# Patient Record
Sex: Male | Born: 1955 | Race: White | Hispanic: No | Smoking: Current every day smoker
Health system: Southern US, Community
[De-identification: ages and names within clinical notes are randomized; demographics above are authoritative.]

## PROBLEM LIST (undated history)

## (undated) DIAGNOSIS — C029 Malignant neoplasm of tongue, unspecified: Secondary | ICD-10-CM

## (undated) DIAGNOSIS — E1142 Type 2 diabetes mellitus with diabetic polyneuropathy: Secondary | ICD-10-CM

## (undated) DIAGNOSIS — R634 Abnormal weight loss: Secondary | ICD-10-CM

## (undated) DIAGNOSIS — J431 Panlobular emphysema: Secondary | ICD-10-CM

## (undated) DIAGNOSIS — E119 Type 2 diabetes mellitus without complications: Secondary | ICD-10-CM

## (undated) DIAGNOSIS — N200 Calculus of kidney: Secondary | ICD-10-CM

## (undated) DIAGNOSIS — E785 Hyperlipidemia, unspecified: Secondary | ICD-10-CM

## (undated) DIAGNOSIS — C021 Malignant neoplasm of border of tongue: Secondary | ICD-10-CM

## (undated) DIAGNOSIS — Z87442 Personal history of urinary calculi: Secondary | ICD-10-CM

## (undated) DIAGNOSIS — I10 Essential (primary) hypertension: Secondary | ICD-10-CM

## (undated) DIAGNOSIS — B351 Tinea unguium: Secondary | ICD-10-CM

## (undated) DIAGNOSIS — C801 Malignant (primary) neoplasm, unspecified: Secondary | ICD-10-CM

## (undated) HISTORY — DX: Essential (primary) hypertension: I10

## (undated) HISTORY — PX: PORTA CATH INSERTION: CATH118285

## (undated) HISTORY — DX: Hyperlipidemia, unspecified: E78.5

## (undated) HISTORY — PX: CYST REMOVAL NECK: SHX6281

## (undated) HISTORY — PX: OTHER SURGICAL HISTORY: SHX169

## (undated) HISTORY — PX: COLONOSCOPY: SHX174

## (undated) HISTORY — DX: Calculus of kidney: N20.0

## (undated) HISTORY — DX: Type 2 diabetes mellitus without complications: E11.9

## (undated) MED FILL — Fosaprepitant Dimeglumine For IV Infusion 150 MG (Base Eq): INTRAVENOUS | Qty: 5 | Status: AC

---

## 2005-05-24 ENCOUNTER — Ambulatory Visit: Payer: Self-pay | Admitting: Family Medicine

## 2008-10-04 ENCOUNTER — Ambulatory Visit: Payer: Self-pay | Admitting: Unknown Physician Specialty

## 2011-01-20 ENCOUNTER — Emergency Department: Payer: Self-pay | Admitting: Unknown Physician Specialty

## 2014-07-22 DIAGNOSIS — N200 Calculus of kidney: Secondary | ICD-10-CM | POA: Insufficient documentation

## 2015-05-15 DIAGNOSIS — F17219 Nicotine dependence, cigarettes, with unspecified nicotine-induced disorders: Secondary | ICD-10-CM | POA: Insufficient documentation

## 2018-05-05 DIAGNOSIS — E1165 Type 2 diabetes mellitus with hyperglycemia: Secondary | ICD-10-CM | POA: Insufficient documentation

## 2018-05-05 DIAGNOSIS — E1159 Type 2 diabetes mellitus with other circulatory complications: Secondary | ICD-10-CM | POA: Insufficient documentation

## 2018-05-05 DIAGNOSIS — I152 Hypertension secondary to endocrine disorders: Secondary | ICD-10-CM | POA: Insufficient documentation

## 2018-05-05 DIAGNOSIS — E1169 Type 2 diabetes mellitus with other specified complication: Secondary | ICD-10-CM | POA: Insufficient documentation

## 2019-05-14 DIAGNOSIS — B351 Tinea unguium: Secondary | ICD-10-CM | POA: Insufficient documentation

## 2020-11-06 ENCOUNTER — Other Ambulatory Visit: Payer: Self-pay | Admitting: Medical

## 2020-11-07 ENCOUNTER — Other Ambulatory Visit: Payer: Self-pay | Admitting: Family Medicine

## 2020-11-10 ENCOUNTER — Other Ambulatory Visit: Payer: Self-pay | Admitting: Family Medicine

## 2020-11-10 DIAGNOSIS — F17210 Nicotine dependence, cigarettes, uncomplicated: Secondary | ICD-10-CM

## 2020-11-10 DIAGNOSIS — Z122 Encounter for screening for malignant neoplasm of respiratory organs: Secondary | ICD-10-CM

## 2020-11-25 ENCOUNTER — Telehealth: Payer: Self-pay

## 2020-11-25 ENCOUNTER — Other Ambulatory Visit: Payer: Self-pay

## 2020-11-25 DIAGNOSIS — Z86010 Personal history of colonic polyps: Secondary | ICD-10-CM

## 2020-11-25 MED ORDER — NA SULFATE-K SULFATE-MG SULF 17.5-3.13-1.6 GM/177ML PO SOLN: 354.0000 mL | Freq: Once | ORAL | 0 refills | Status: AC

## 2020-11-25 NOTE — Telephone Encounter (Signed)
Gastroenterology Pre-Procedure Review  Request Date: 12/08/2020 Requesting Physician: Dr. Allegra Lai  PATIENT REVIEW QUESTIONS: The patient responded to the following health history questions as indicated:    1. Are you having any GI issues? no 2. Do you have a personal history of Polyps? Yes last one removed 1  3. Do you have a family history of Colon Cancer or Polyps? no 4. Diabetes Mellitus? Yes  5. Joint replacements in the past 12 months?no 6. Major health problems in the past 3 months?no 7. Any artificial heart valves, MVP, or defibrillator?no    MEDICATIONS & ALLERGIES:    Patient reports the following regarding taking any anticoagulation/antiplatelet therapy:   Plavix, Coumadin, Eliquis, Xarelto, Lovenox, Pradaxa, Brilinta, or Effient? no Aspirin? No   Patient confirms/reports the following medications:  No current outpatient medications on file.   No current facility-administered medications for this visit.    Patient confirms/reports the following allergies:  Not on File  No orders of the defined types were placed in this encounter.   AUTHORIZATION INFORMATION Primary Insurance: 1D#: Group #:  Secondary Insurance: 1D#: Group #:  SCHEDULE INFORMATION: Date:  Time: Location:

## 2020-12-01 ENCOUNTER — Ambulatory Visit
Admission: RE | Admit: 2020-12-01 | Discharge: 2020-12-01 | Disposition: A | Discharge status: Home / Self Care | Payer: 59 | Source: Ambulatory Visit | Attending: Family Medicine | Admitting: Family Medicine

## 2020-12-01 ENCOUNTER — Other Ambulatory Visit: Payer: Self-pay

## 2020-12-01 DIAGNOSIS — Z122 Encounter for screening for malignant neoplasm of respiratory organs: Secondary | ICD-10-CM

## 2020-12-01 DIAGNOSIS — F17210 Nicotine dependence, cigarettes, uncomplicated: Secondary | ICD-10-CM

## 2020-12-02 DIAGNOSIS — J431 Panlobular emphysema: Secondary | ICD-10-CM | POA: Insufficient documentation

## 2020-12-17 ENCOUNTER — Ambulatory Visit: Admission: RE | Admit: 2020-12-17 | Payer: 59 | Source: Ambulatory Visit | Admitting: Gastroenterology

## 2020-12-17 ENCOUNTER — Encounter: Admission: RE | Payer: Self-pay | Source: Ambulatory Visit

## 2020-12-17 SURGERY — COLONOSCOPY WITH PROPOFOL
Anesthesia: General

## 2021-04-14 ENCOUNTER — Telehealth: Payer: Self-pay | Admitting: Internal Medicine

## 2021-04-14 NOTE — Telephone Encounter (Signed)
Received std  forms from [facility]   Patient aware and will complete ROI and payment at new patient ov

## 2021-04-15 ENCOUNTER — Other Ambulatory Visit: Payer: Self-pay

## 2021-04-15 ENCOUNTER — Encounter: Payer: Self-pay | Admitting: Cardiovascular Disease

## 2021-04-15 ENCOUNTER — Ambulatory Visit: Payer: 59 | Admitting: Cardiovascular Disease

## 2021-04-15 VITALS — BP 140/60 | HR 93 | Ht 73.0 in | Wt 208.1 lb

## 2021-04-15 DIAGNOSIS — I493 Ventricular premature depolarization: Secondary | ICD-10-CM

## 2021-04-15 DIAGNOSIS — E1165 Type 2 diabetes mellitus with hyperglycemia: Secondary | ICD-10-CM

## 2021-04-15 DIAGNOSIS — F17200 Nicotine dependence, unspecified, uncomplicated: Secondary | ICD-10-CM

## 2021-04-15 DIAGNOSIS — J432 Centrilobular emphysema: Secondary | ICD-10-CM

## 2021-04-15 DIAGNOSIS — R0602 Shortness of breath: Secondary | ICD-10-CM

## 2021-04-15 MED ORDER — METOPROLOL SUCCINATE ER 50 MG PO TB24: 50.0000 mg | ORAL_TABLET | Freq: Every day | ORAL | 3 refills | Status: DC

## 2021-04-15 NOTE — Telephone Encounter (Signed)
After review with Dr. Mariah Milling and nurse Doy Hutching, there is no cardiac diagnosis to take pt out of work at this time for short term disability. Pt may return to work per Dr. Mariah Milling, no restrictions.  Pt's form return to pt [employer] return pt's $29.00 fee to Mr. [place].

## 2021-04-15 NOTE — Patient Instructions (Addendum)
Medication Instructions:   Please START metoprolol succinate 50 mg daily  If you need a refill on your cardiac medications before your next appointment, please call your pharmacy.   Lab work: No new labs needed  Testing/Procedures: Your physician has requested that you have an echocardiogram. Echocardiography is a painless test that uses sound waves to create images of your heart. It provides your doctor with information about the size and shape of your heart and how well your heart's chambers and valves are working. This procedure takes approximately one hour. There are no restrictions for this procedure.  There is a possibility that an IV may need to be started during your test to inject an image enhancing agent. This is done to obtain more optimal pictures of your heart. Therefore we ask that you do at least drink some water prior to coming in to hydrate your veins.    Follow-Up: At [facility], you and your health needs are our priority.  As part of our continuing mission to provide you with exceptional heart care, we have created designated Provider Care Teams.  These Care Teams include your primary Cardiologist (physician) and Advanced Practice Providers (APPs -  Physician Assistants and Nurse Practitioners) who all work together to provide you with the care you need, when you need it.  You will need a follow up appointment in 6 months  Providers on your designated Care Team:   Nicolasa Ducking, NP Eula Listen, PA-C Marisue Ivan, PA-C Cadence Beltrami, [place]   COVID-19 Vaccine Information can be found at: [email or web address] For questions related to vaccine distribution or appointments, please email [email or web address] or call [phone].

## 2021-04-15 NOTE — Telephone Encounter (Signed)
Patient came by office to sign and pay $29.00 fee Forms placed in nurse box

## 2021-04-15 NOTE — Progress Notes (Signed)
Cardiology Office Note  Date:  04/15/2021   ID:  Shane Perry, DOB 12-16-1955, MRN [id number]  PCP:  Gus Height, PA-C   Chief Complaint  Patient presents with   New Patient (Initial Visit)    Ref by occupational health; Arnetha Courser, NP for cardiac arrhythmia. Medications reviewed by the patient verbally.     HPI:  Shane Perry is a 65 year old gentleman with past medical history of Hypertension Diabetes type 2 uncontrolled, A1C 7.3 Hyperlipidemia Prior history of smoking, emphysema Kidney stone Presenting by referral from [facility] [employer] for cardiac arrhythmia  On discussions today, reports that he has had recent issues with his diabetes Through primary care, medication changes, A1c has been improving  Reports he feels well, smokes 2 packs/day Noted by primary care to have frequent irregular beats He has been having no symptoms from his arrhythmia No near syncope, syncope, no orthostasis  CT scan: 11/2020 images pulled up and reviewed by myself Minimal coronary calcification, minimal aortic atherosclerosis  EKG personally reviewed by myself on todays visit Normal sinus rhythm with PVCs, no significant ST-T wave changes   PMH:   has a past medical history of Diabetes mellitus without complication (HCC), Hyperlipidemia, Hypertension, and Kidney stones.  PSH:    Past Surgical History:  Procedure Laterality Date   COLONOSCOPY     CYST REMOVAL NECK      Current Outpatient Medications  Medication Sig Dispense Refill   acetaminophen (TYLENOL) 500 MG tablet Take 500 mg by mouth every 4 (four) hours as needed.     empagliflozin (JARDIANCE) 10 MG TABS tablet Take 1 tablet by mouth every morning.     glipiZIDE (GLUCOTROL XL) 10 MG 24 hr tablet Take 10 mg by mouth 2 (two) times daily.     metFORMIN (GLUCOPHAGE-XR) 500 MG 24 hr tablet Take 1,000 mg by mouth 2 (two) times daily.     metoprolol succinate (TOPROL-XL) 50 MG 24 hr tablet Take 1 tablet (50 mg  total) by mouth daily. Take with or immediately following a meal. 90 tablet 3   Multiple Vitamin (MULTIVITAMIN) tablet Take 1 tablet by mouth daily.     No current facility-administered medications for this visit.    Allergies:   Statins and Lisinopril   Social History:  The patient  reports that he has been smoking cigarettes. He has a 100.00 pack-year smoking history. He has never used smokeless tobacco. He reports current alcohol use of about 1.0 standard drink per week. He reports that he does not use drugs.   Family History:   family history includes Alzheimer's disease in his father and mother; Diabetes type I in his brother; Heart disease in his sister; Hypertension in his mother.    Review of Systems: Review of Systems  Constitutional: Negative.   HENT: Negative.    Respiratory: Negative.    Cardiovascular: Negative.   Gastrointestinal: Negative.   Musculoskeletal: Negative.   Neurological: Negative.   Psychiatric/Behavioral: Negative.    All other systems reviewed and are negative.   PHYSICAL EXAM: VS:  BP 140/60 (BP Location: Right Arm, Patient Position: Sitting, Cuff Size: Normal)   Pulse 93   Ht 6\' 1"  (1.854 m)   Wt 208 lb 2 oz (94.4 kg)   SpO2 98%   BMI 27.46 kg/m  , BMI Body mass index is 27.46 kg/m. GEN: Well nourished, well developed, in no acute distress HEENT: normal Neck: no JVD, carotid bruits, or masses Cardiac: RRR; ectopy appreciated, no murmurs, rubs, or  gallops,no edema  Respiratory:  clear to auscultation bilaterally, normal work of breathing GI: soft, nontender, nondistended, + BS MS: no deformity or atrophy Skin: warm and dry, no rash Neuro:  Strength and sensation are intact Psych: euthymic mood, full affect  Recent Labs: No results found for requested labs within last 8760 hours.    Lipid Panel No results found for: CHOL, HDL, LDLCALC, TRIG    Wt Readings from Last 3 Encounters:  04/15/21 208 lb 2 oz (94.4 kg)       ASSESSMENT  AND PLAN:  Problem List Items Addressed This Visit   None Visit Diagnoses     Poorly controlled type 2 diabetes mellitus (HCC)    -  Primary   Relevant Medications   empagliflozin (JARDIANCE) 10 MG TABS tablet   glipiZIDE (GLUCOTROL XL) 10 MG 24 hr tablet   metFORMIN (GLUCOPHAGE-XR) 500 MG 24 hr tablet   Centrilobular emphysema (HCC)       Frequent PVCs       Relevant Medications   metoprolol succinate (TOPROL-XL) 50 MG 24 hr tablet   Other Relevant Orders   EKG 12-Lead   ECHOCARDIOGRAM COMPLETE   Shortness of breath       Relevant Orders   EKG 12-Lead   ECHOCARDIOGRAM COMPLETE   Smoker          PVCs Reports he is asymptomatic, Discussed mechanism of his arrhythmia Recommend he start metoprolol succinate 50 mg daily For low blood pressure may need to decrease dose down to 25 mg daily -Echocardiogram has been ordered to rule out structural heart disease -CT coronary calcium scoring with minimal coronary calcification He denies any anginal symptoms, no ischemic work-up needed at this time -As he is asymptomatic, no near syncope or syncope, no orthostasis, no restrictions on his work needed We will call him with the results of his echocardiogram -In follow-up could consider Holter monitor to look at PVC burden on metoprolol  Smoker/COPD We have encouraged him to continue to work on weaning his cigarettes and smoking cessation. He will continue to work on this and does not want any assistance with chantix.    Diabetes type 2 Dramatic improvement in his A1c, managed by primary care  Hypertension Blood pressure mildly elevated, metoprolol added as above , more for rhythm control then blood pressure control For any orthostasis symptoms could decrease metoprolol succinate to 25 daily   Total encounter time more than 60 minutes  Greater than 50% was spent in counseling and coordination of care with the patient  Patient seen in consultation for Patsey Berthold will be  referred back to her her office for ongoing care of the issues detailed above  Signed, Dossie Arbour, M.D., Ph.D. [facility] [place], [place] [phone]

## 2021-05-11 DIAGNOSIS — L219 Seborrheic dermatitis, unspecified: Secondary | ICD-10-CM | POA: Insufficient documentation

## 2021-05-21 ENCOUNTER — Other Ambulatory Visit: Payer: Self-pay

## 2021-05-21 ENCOUNTER — Ambulatory Visit (INDEPENDENT_AMBULATORY_CARE_PROVIDER_SITE_OTHER): Payer: 59

## 2021-05-21 ENCOUNTER — Other Ambulatory Visit: Payer: 59

## 2021-05-21 DIAGNOSIS — R0602 Shortness of breath: Secondary | ICD-10-CM

## 2021-05-21 DIAGNOSIS — I493 Ventricular premature depolarization: Secondary | ICD-10-CM | POA: Diagnosis not present

## 2021-05-21 LAB — ECHOCARDIOGRAM COMPLETE
AR max vel: 1.99 cm2
AV Area VTI: 2.06 cm2
AV Area mean vel: 1.94 cm2
AV Mean grad: 3.5 mmHg
AV Peak grad: 7 mmHg
Ao pk vel: 1.32 m/s
Area-P 1/2: 4.19 cm2
Calc EF: 59.3 %
S' Lateral: 3.2 cm
Single Plane A2C EF: 58.1 %
Single Plane A4C EF: 59.9 %

## 2021-05-25 ENCOUNTER — Telehealth: Payer: Self-pay

## 2021-05-25 NOTE — Telephone Encounter (Signed)
Attempted to reach out to pt, unable to make contact [facility]MT[facility] for ECHO results

## 2021-05-25 NOTE — Telephone Encounter (Signed)
Able to reach pt regarding his recent ECHO Dr. Mariah Milling had a chance to review his results and advised   "Echocardiogram  Low normal ejection fraction, mild relaxation abnormality,  No significant valvular heart disease, normal pressures  There is some dilation of the left atrium "  Shane Perry very thankful for the phone call of his results, all questions and concerns were address with nothing further at this time. Will see at next schedule f/u appt. Pt also request letter to be sent to him via mail so he can return to work since his ECHO was good results.

## 2021-05-25 NOTE — Telephone Encounter (Signed)
Patient calling to discuss recent testing results  ° °Please call  ° °

## 2021-10-11 NOTE — Progress Notes (Signed)
Cardiology Office Note ? ?Date:  10/12/2021  ? ?ID:  Shane Perry, DOB 17-Oct-1955, MRN [id number] ? ?PCP:  Arnetha Courser, NP  ? ?Chief Complaint  ?Patient presents with  ? 6 month follow up  ?  "Doing well." Medications reviewed by the patient verbally.   ? ? ?HPI:  ?Mr. Shane Perry is a 66 year old gentleman with past medical history of ?Hypertension ?Diabetes type 2 uncontrolled, A1C 7.3 ?Hyperlipidemia ?Prior history of smoking, emphysema, 2 packs/day ?Kidney stone ?Coronary calcifications, aortic atherosclerosis on CT ?Who presents for follow-up of her cardiac arrhythmia ? ?Last seen in clinic September 2022 ? ?Prior studies reviewed ?Echocardiogram 09-Jun-2021?Low normal ejection fraction, relaxation abnormality ?Dilation of left atrium ? ?Slipped disk, turned in bed, gets numbness in the legs ? ?Biting tongue at times, chronic sore on the left ?Needs dental work ? ?Still smoking, 1 ppd, trying to cut ?Periodic Kidney stone pain ? ?Denies chest pain concerning for angina ?Lab work reviewed ?A1C 7.3 ?Toal chol 213, LDL 145 ?Unclear if taking crestor 10 ? ?EKG personally reviewed by myself on todays visit ?Normal sinus rhythm rate 78 with PVCs, no significant ST-T wave changes ? ?CT scan: 11/2020 images pulled up and reviewed by myself ?Minimal coronary calcification, minimal aortic atherosclerosis ? ? ?PMH:   has a past medical history of Diabetes mellitus without complication (HCC), Hyperlipidemia, Hypertension, and Kidney stones. ? ?PSH:    ?Past Surgical History:  ?Procedure Laterality Date  ? COLONOSCOPY    ? CYST REMOVAL NECK    ? ? ?Current Outpatient Medications  ?Medication Sig Dispense Refill  ? acetaminophen (TYLENOL) 500 MG tablet Take 500 mg by mouth every 4 (four) hours as needed.    ? empagliflozin (JARDIANCE) 10 MG TABS tablet Take 1 tablet by mouth every morning.    ? glipiZIDE (GLUCOTROL XL) 10 MG 24 hr tablet Take 10 mg by mouth 2 (two) times daily.    ? hydrocortisone 2.5 % cream  Apply 2 times a day to rash on eyebrows.    ? losartan (COZAAR) 50 MG tablet Take 50 mg by mouth daily.    ? metFORMIN (GLUCOPHAGE-XR) 500 MG 24 hr tablet Take 1,000 mg by mouth 2 (two) times daily.    ? metoprolol succinate (TOPROL-XL) 50 MG 24 hr tablet Take 1 tablet (50 mg total) by mouth daily. Take with or immediately following a meal. 90 tablet 3  ? Multiple Vitamin (MULTIVITAMIN) tablet Take 1 tablet by mouth daily.    ? rosuvastatin (CRESTOR) 10 MG tablet Take 10 mg by mouth every other day.    ? ?No current facility-administered medications for this visit.  ? ? ?Allergies:   Statins and Lisinopril  ? ?Social History:  The patient  reports that he has been smoking cigarettes. He has a 50.00 pack-year smoking history. He has never used smokeless tobacco. He reports current alcohol use of about 1.0 standard drink per week. He reports that he does not use drugs.  ? ?Family History:   family history includes Alzheimer's disease in his father and mother; Diabetes type I in his brother; Heart disease in his sister; Hypertension in his mother.  ? ? ?Review of Systems: ?Review of Systems  ?Constitutional: Negative.   ?HENT: Negative.    ?Respiratory: Negative.    ?Cardiovascular: Negative.   ?Gastrointestinal: Negative.   ?Musculoskeletal: Negative.   ?Neurological: Negative.   ?Psychiatric/Behavioral: Negative.    ?All other systems reviewed and are negative. ? ? ?PHYSICAL EXAM: ?VS:  BP 130/82 (BP Location: Left Arm, Patient Position: Sitting, Cuff Size: Normal)   Pulse 78   Ht 6\' 1"  (1.854 m)   Wt 219 lb 6 oz (99.5 kg)   SpO2 97%   BMI 28.94 kg/m?  , BMI Body mass index is 28.94 kg/m? ?Constitutional:  oriented to person, place, and time. No distress.  ?HENT:  ?Head: Grossly normal ?Eyes:  no discharge. No scleral icterus.  ?Neck: No JVD, no carotid bruits  ?Cardiovascular: Regular rate and rhythm, no murmurs appreciated ?Pulmonary/Chest: Clear to auscultation bilaterally, no wheezes or rails ?Abdominal:  Soft.  no distension.  no tenderness.  ?Musculoskeletal: Normal range of motion ?Neurological:  normal muscle tone. Coordination normal. No atrophy ?Skin: Skin warm and dry ?Psychiatric: normal affect, pleasant ? ?Recent Labs: ?No results found for requested labs within last 8760 hours.  ? ? ?Lipid Panel ?No results found for: CHOL, HDL, LDLCALC, TRIG ?  ? ?Wt Readings from Last 3 Encounters:  ?10/12/21 219 lb 6 oz (99.5 kg)  ?04/15/21 208 lb 2 oz (94.4 kg)  ? ? ?ASSESSMENT AND PLAN: ? ?Problem List Items Addressed This Visit   ?None ?Visit Diagnoses   ? ? Poorly controlled type 2 diabetes mellitus (HCC)    -  Primary  ? Relevant Medications  ? rosuvastatin (CRESTOR) 10 MG tablet  ? losartan (COZAAR) 50 MG tablet  ? Other Relevant Orders  ? EKG 12-Lead  ? Centrilobular emphysema (HCC)      ? Relevant Orders  ? EKG 12-Lead  ? Frequent PVCs      ? Relevant Medications  ? rosuvastatin (CRESTOR) 10 MG tablet  ? losartan (COZAAR) 50 MG tablet  ? Other Relevant Orders  ? EKG 12-Lead  ? Shortness of breath      ? Relevant Orders  ? EKG 12-Lead  ? ?  ? ?PVCs ?No sx, rare beats ?Dates back many years ?On metoprolol ? ?Smoker/COPD ?We have encouraged him to continue to work on weaning his cigarettes and smoking cessation. He will continue to work on this and does not want any assistance with chantix.   ? ?Diabetes type 2 ?Dramatic improvement in his A1c, managed by primary care ?We have encouraged continued exercise, careful diet management in an effort to lose weight. ? ?Hypertension ?Blood pressure is well controlled on today's visit. No changes made to the medications. ? ?Coronary artery disease with stable angina ?Stressed importance of smoking cessation, risk of diabetes control, A1c more than 7 ?Recommend he stay on his Crestor, will increase dose up to 20 daily ? ?Hyperlipidemia ?Recommend he stay on Crestor increased dose to 20 ?May need to add Zetia 10 daily for goal LDL less than 70 ? ?Aortic atherosclerosis ?Plan  to aggressively treat lipids, discussed with him ? ? Total encounter time more than 30 minutes ? Greater than 50% was spent in counseling and coordination of care with the patient ? ? ?Signed, ?04/17/21, M.D., Ph.D. ?[facility] [place], [place] ?[phone] ? ?

## 2021-10-12 ENCOUNTER — Other Ambulatory Visit: Payer: Self-pay

## 2021-10-12 ENCOUNTER — Ambulatory Visit: Payer: 59 | Admitting: Cardiovascular Disease

## 2021-10-12 ENCOUNTER — Encounter: Payer: Self-pay | Admitting: Cardiovascular Disease

## 2021-10-12 VITALS — BP 130/82 | HR 78 | Ht 73.0 in | Wt 219.4 lb

## 2021-10-12 DIAGNOSIS — J432 Centrilobular emphysema: Secondary | ICD-10-CM | POA: Diagnosis not present

## 2021-10-12 DIAGNOSIS — E1165 Type 2 diabetes mellitus with hyperglycemia: Secondary | ICD-10-CM | POA: Diagnosis not present

## 2021-10-12 DIAGNOSIS — R0602 Shortness of breath: Secondary | ICD-10-CM

## 2021-10-12 DIAGNOSIS — I493 Ventricular premature depolarization: Secondary | ICD-10-CM | POA: Diagnosis not present

## 2021-10-12 MED ORDER — ROSUVASTATIN CALCIUM 20 MG PO TABS: 20.0000 mg | ORAL_TABLET | Freq: Every day | ORAL | 3 refills | Status: DC

## 2021-10-12 NOTE — Patient Instructions (Addendum)
Medication Instructions:  ?- Your physician has recommended you make the following change in your medication:  ? ?1) INCREASE crestor (rosuvastatin) up to 20 mg: ?- take 1 tablet by mouth once daily ? ? ?If you need a refill on your cardiac medications before your next appointment, please call your pharmacy.  ? ? ?Lab work: ?No new labs needed ? ? ?Testing/Procedures: ?No new testing needed ? ? ?Follow-Up: ?At [facility], you and your health needs are our priority.  As part of our continuing mission to provide you with exceptional heart care, we have created designated Provider Care Teams.  These Care Teams include your primary Cardiologist (physician) and Advanced Practice Providers (APPs -  Physician Assistants and Nurse Practitioners) who all work together to provide you with the care you need, when you need it. ? ?You will need a follow up appointment in 12 months ? ?Providers on your designated Care Team:   ?Nicolasa Ducking, NP ?Eula Listen, PA-C ?Cadence Fransico Michael, PA-C ? ?COVID-19 Vaccine Information can be found at: PodExchange.nl For questions related to vaccine distribution or appointments, please email [email or web address] or call [phone].  ? ?

## 2022-05-13 ENCOUNTER — Other Ambulatory Visit: Payer: Self-pay | Admitting: Cardiovascular Disease

## 2022-10-14 DIAGNOSIS — E1142 Type 2 diabetes mellitus with diabetic polyneuropathy: Secondary | ICD-10-CM | POA: Insufficient documentation

## 2022-12-23 ENCOUNTER — Emergency Department (HOSPITAL_COMMUNITY)
Admission: EM | Admit: 2022-12-23 | Discharge: 2022-12-23 | Disposition: A | Discharge status: Home / Self Care | Payer: 59 | Attending: Emergency Medicine | Admitting: Emergency Medicine

## 2022-12-23 ENCOUNTER — Other Ambulatory Visit: Payer: Self-pay

## 2022-12-23 DIAGNOSIS — R634 Abnormal weight loss: Secondary | ICD-10-CM | POA: Insufficient documentation

## 2022-12-23 DIAGNOSIS — R4182 Altered mental status, unspecified: Secondary | ICD-10-CM | POA: Diagnosis present

## 2022-12-23 DIAGNOSIS — Y900 Blood alcohol level of less than 20 mg/100 ml: Secondary | ICD-10-CM | POA: Diagnosis not present

## 2022-12-23 DIAGNOSIS — Z79899 Other long term (current) drug therapy: Secondary | ICD-10-CM | POA: Insufficient documentation

## 2022-12-23 DIAGNOSIS — I10 Essential (primary) hypertension: Secondary | ICD-10-CM | POA: Insufficient documentation

## 2022-12-23 DIAGNOSIS — E1165 Type 2 diabetes mellitus with hyperglycemia: Secondary | ICD-10-CM | POA: Insufficient documentation

## 2022-12-23 DIAGNOSIS — N179 Acute kidney failure, unspecified: Secondary | ICD-10-CM | POA: Diagnosis not present

## 2022-12-23 DIAGNOSIS — Z7984 Long term (current) use of oral hypoglycemic drugs: Secondary | ICD-10-CM | POA: Diagnosis not present

## 2022-12-23 LAB — CBC WITH DIFFERENTIAL/PLATELET
Abs Immature Granulocytes: 0.04 10*3/uL (ref 0.00–0.07)
Basophils Absolute: 0 10*3/uL (ref 0.0–0.1)
Basophils Relative: 1 %
Eosinophils Absolute: 0.2 10*3/uL (ref 0.0–0.5)
Eosinophils Relative: 2 %
HCT: 44.6 % (ref 39.0–52.0)
Hemoglobin: 14.8 g/dL (ref 13.0–17.0)
Immature Granulocytes: 1 %
Lymphocytes Relative: 22 %
Lymphs Abs: 1.8 10*3/uL (ref 0.7–4.0)
MCH: 34.2 pg — ABNORMAL HIGH (ref 26.0–34.0)
MCHC: 33.2 g/dL (ref 30.0–36.0)
MCV: 103 fL — ABNORMAL HIGH (ref 80.0–100.0)
Monocytes Absolute: 0.6 10*3/uL (ref 0.1–1.0)
Monocytes Relative: 8 %
Neutro Abs: 5.2 10*3/uL (ref 1.7–7.7)
Neutrophils Relative %: 66 %
Platelets: 177 10*3/uL (ref 150–400)
RBC: 4.33 MIL/uL (ref 4.22–5.81)
RDW: 15.8 % — ABNORMAL HIGH (ref 11.5–15.5)
WBC: 7.8 10*3/uL (ref 4.0–10.5)
nRBC: 0 % (ref 0.0–0.2)

## 2022-12-23 LAB — COMPREHENSIVE METABOLIC PANEL
ALT: 18 U/L (ref 0–44)
AST: 42 U/L — ABNORMAL HIGH (ref 15–41)
Albumin: 4 g/dL (ref 3.5–5.0)
Alkaline Phosphatase: 39 U/L (ref 38–126)
Anion gap: 13 (ref 5–15)
BUN: 34 mg/dL — ABNORMAL HIGH (ref 8–23)
CO2: 19 mmol/L — ABNORMAL LOW (ref 22–32)
Calcium: 9 mg/dL (ref 8.9–10.3)
Chloride: 110 mmol/L (ref 98–111)
Creatinine, Ser: 1.31 mg/dL — ABNORMAL HIGH (ref 0.61–1.24)
GFR, Estimated: 60 mL/min — ABNORMAL LOW (ref >60)
Glucose, Bld: 128 mg/dL — ABNORMAL HIGH (ref 70–99)
Potassium: 3.9 mmol/L (ref 3.5–5.1)
Sodium: 142 mmol/L (ref 135–145)
Total Bilirubin: 0.5 mg/dL (ref 0.3–1.2)
Total Protein: 6.6 g/dL (ref 6.5–8.1)

## 2022-12-23 LAB — CBG MONITORING, ED: Glucose-Capillary: 133 mg/dL — ABNORMAL HIGH (ref 70–99)

## 2022-12-23 LAB — MAGNESIUM: Magnesium: 2.3 mg/dL (ref 1.7–2.4)

## 2022-12-23 LAB — ETHANOL: Alcohol, Ethyl (B): <10 mg/dL (ref <10)

## 2022-12-23 MED ORDER — LACTATED RINGERS IV BOLUS
1000.0000 mL | Freq: Once | INTRAVENOUS | Status: AC
  Administered 2022-12-23: 1000 mL via INTRAVENOUS

## 2022-12-23 NOTE — ED Notes (Signed)
Pt is a&ox4, pale, warm and dry to touch. Pt complains of 60 pound weight loss over a 3-4 month period, and a bite to his tongue which he has no idea how it happened. Pt denies any cp/shob/n/v/d. Pt changed into gown, attached to monitor/vitals. Side rails up x 2, call light within reach.

## 2022-12-23 NOTE — ED Triage Notes (Signed)
Pt coming from work via EMS where coworkers thought he was not acting right. 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 Pt is a&ox4. Pt complains of a bite to his tongue, and he is leaning to the right. Pt denies cp/shob/n/v/d. He does state that he has lost 60 pounds over a 3-4 month period.

## 2022-12-23 NOTE — ED Provider Notes (Signed)
[place] EMERGENCY DEPARTMENT AT [facility] Provider Note   CSN: [id number] Arrival date & time: 12/23/22  1030     History  Chief Complaint  Patient presents with   Rapid Weight Loss   Altered Mental Status    Coming from work where coworkers felt he was not acting right.     Shane Perry is a 67 y.o. male.  Patient is a 67 year old male with a past medical history of hypertension, diabetes and hyperlipidemia presenting to the emergency department with concern for altered mental status.  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  Per EMS his coworkers were concerned that he was not acting like himself and seemed more fatigued and leaning to the right side.  The patient states that he always sleeps on his right side and that is normal for him.  The patient states that he has been feeling well recently without any chest pain, shortness of breath, fevers, nausea, vomiting or diarrhea.  He states he has had some constipation but denies any black or bloody stools.  He denies any dysuria or hematuria.  He denies any numbness or weakness.  He states that he has had approximately 60 pound weight loss in the last several months but did see his primary doctor for this in March and is scheduled for his annual cancer screenings but have not had these done yet.  The history is provided by the patient and the EMS personnel.  Altered Mental Status      Home Medications Prior to Admission medications   Medication Sig Start Date End Date Taking? Authorizing Provider  acetaminophen (TYLENOL) 500 MG tablet Take 500 mg by mouth every 4 (four) hours as needed.    [provider]  empagliflozin (JARDIANCE) 10 MG TABS tablet Take 1 tablet by mouth every morning. 01/30/21   [provider]  glipiZIDE (GLUCOTROL XL) 10 MG 24 hr tablet Take 10 mg by mouth 2 (two) times daily. 04/01/21   [provider]  losartan  (COZAAR) 50 MG tablet Take 50 mg by mouth daily. 08/11/21   [provider]  metFORMIN (GLUCOPHAGE-XR) 500 MG 24 hr tablet Take 1,000 mg by mouth 2 (two) times daily. 03/19/21   [provider]  metoprolol succinate (TOPROL-XL) 50 MG 24 hr tablet TAKE 1 TABLET BY MOUTH ONCE DAILY WITH  OR  IMMEDIATELY  FOLLOWING  A  MEAL 05/13/22   Gollan, Tollie Pizza, MD  Multiple Vitamin (MULTIVITAMIN) tablet Take 1 tablet by mouth daily.    [provider]  rosuvastatin (CRESTOR) 20 MG tablet Take 1 tablet (20 mg total) by mouth daily. 10/12/21 01/10/22  Antonieta Iba, MD      Allergies    Statins and Lisinopril    Review of Systems   Review of Systems  Physical Exam Updated Vital Signs BP (!) 128/48 (BP Location: Left Arm)   Pulse 61   Temp 98.4 F (36.9 C) (Oral)   Resp 17   SpO2 98%  Physical Exam Vitals and nursing note reviewed.  Constitutional:      General: He is not in acute distress.    Appearance: Normal appearance.  HENT:     Head: Normocephalic and atraumatic.     Nose: Nose normal.     Mouth/Throat:     Mouth: Mucous membranes are moist.     Pharynx: Oropharynx is clear.  Eyes:     Extraocular Movements:  Extraocular movements intact.     Conjunctiva/sclera: Conjunctivae normal.     Pupils: Pupils are equal, round, and reactive to light.  Cardiovascular:     Rate and Rhythm: Normal rate and regular rhythm.     Heart sounds: Normal heart sounds.  Pulmonary:     Effort: Pulmonary effort is normal.     Breath sounds: Normal breath sounds.  Abdominal:     General: Abdomen is flat.     Palpations: Abdomen is soft.     Tenderness: There is no abdominal tenderness.  Musculoskeletal:        General: Normal range of motion.     Cervical back: Normal range of motion.  Skin:    General: Skin is warm and dry.  Neurological:     General: No focal deficit present.     Mental Status: He is alert and oriented to person, place, and time.     Cranial Nerves:  No cranial nerve deficit.     Sensory: No sensory deficit.     Motor: No weakness.     Coordination: Coordination normal.  Psychiatric:        Mood and Affect: Mood normal.        Behavior: Behavior normal.     ED Results / Procedures / Treatments   Labs (all labs ordered are listed, but only abnormal results are displayed) Labs Reviewed  COMPREHENSIVE METABOLIC PANEL - Abnormal; Notable for the following components:      Result Value   CO2 19 (*)    Glucose, Bld 128 (*)    BUN 34 (*)    Creatinine, Ser 1.31 (*)    AST 42 (*)    GFR, Estimated 60 (*)    All other components within normal limits  CBC WITH DIFFERENTIAL/PLATELET - Abnormal; Notable for the following components:   MCV 103.0 (*)    MCH 34.2 (*)    RDW 15.8 (*)    All other components within normal limits  CBG MONITORING, ED - Abnormal; Notable for the following components:   Glucose-Capillary 133 (*)    All other components within normal limits  MAGNESIUM  ETHANOL    EKG EKG Interpretation  Date/Time:  Thursday December 23 2022 10:38:07 EDT Ventricular Rate:  87 PR Interval:  176 QRS Duration: 115 QT Interval:  407 QTC Calculation: 393 R Axis:   106 Text Interpretation: Sinus rhythm Ventricular bigeminy Nonspecific intraventricular conduction delay No previous ECGs available Confirmed by Elayne Snare ([id number]) on 12/23/2022 10:39:39 AM  Radiology No results found.  Procedures Procedures    Medications Ordered in ED Medications  lactated ringers bolus 1,000 mL (has no administration in time range)    ED Course/ Medical Decision Making/ A&P Clinical Course as of 12/23/22 1435  Thu Dec 23, 2022  1419 Labs show mild AKI with Cr 1.3 from baseline 0.9 on outpatient labs with a mildly low bicarb. He will be given IVF. He is at his neurologic baseline and will be stable for discharge after receiving fluids. [VK]    Clinical Course User Index [VK] Rexford Maus, DO                              Medical Decision Making This patient presents to the ED with chief complaint(s) of AMS with pertinent past medical history of DM, HTN, HLD which further complicates the presenting complaint. The complaint involves an extensive differential diagnosis and also carries  with it a high risk of complications and morbidity.    The differential diagnosis includes hypo or hyperglycemia, dehydration, electrolyte abnormality, ACS, arrhythmia, anemia, patient has no focal neurologic deficits making CVA unlikely, considering intoxication  Additional history obtained: Additional history obtained from EMS  Records reviewed Primary Care Documents  ED Course and Reassessment: On patient's arrival to the emergency department he is alert and oriented in no acute distress without focal neurologic deficits.  He appears to be at his neurologic baseline.  Accu-Chek on arrival was within normal range.  Patient had EKG performed that did show bigeminy without prior EKGs for comparison.  The patient will have labs performed to evaluate for possible etiology of his mental status change as well as a urine.  He has no focal deficits making CVA unlikely.  He will be closely reassessed.  Independent labs interpretation:  The following labs were independently interpreted: Cr 1.3 from baseline normal, mildly low bicarb  Independent visualization of imaging: - N/A  Consultation: - Consulted or discussed management/test interpretation w/ external professional: N/A  Consideration for admission or further workup: Patient has no emergent conditions requiring admission or further work-up at this time and is stable for discharge home with primary care follow-up  Social Determinants of health: N/A    Amount and/or Complexity of Data Reviewed Labs: ordered.          Final Clinical Impression(s) / ED Diagnoses Final diagnoses:  AKI (acute kidney injury) [facility])    Rx / DC Orders ED Discharge Orders     None          Rexford Maus, DO 12/23/22 1435

## 2022-12-23 NOTE — Discharge Instructions (Signed)
You were seen in the emergency department for your concern for confusion.  You appeared to be at your normal self in the emergency department.  Your workup did show that you are mildly dehydrated with a mild increase of your kidney function.  We gave you fluids in the emergency department and you can follow-up with your primary doctor to have your symptoms and your kidney function rechecked.  You should return to the emergency department if you are having progressively worsening confusion, you have numbness or weakness on one side of the body compared to the other, you are drowsy and hard to wake up or if you have any other new or concerning symptoms.

## 2023-01-31 ENCOUNTER — Other Ambulatory Visit: Payer: Self-pay

## 2023-01-31 ENCOUNTER — Telehealth: Payer: Self-pay

## 2023-01-31 DIAGNOSIS — Z1211 Encounter for screening for malignant neoplasm of colon: Secondary | ICD-10-CM

## 2023-01-31 MED ORDER — NA SULFATE-K SULFATE-MG SULF 17.5-3.13-1.6 GM/177ML PO SOLN: 1.0000 | Freq: Once | ORAL | 0 refills | Status: AC

## 2023-01-31 NOTE — Telephone Encounter (Signed)
Gastroenterology Pre-Procedure Review  Request Date: 02/18/23 Requesting Physician: Dr. Servando Snare  PATIENT REVIEW QUESTIONS: The patient responded to the following health history questions as indicated:    1. Are you having any GI issues? no.  Tongue cancer currently. 2. Do you have a personal history of Polyps? Last colonoscopy 5-6 years ago 3. Do you have a family history of Colon Cancer or Polyps? no 4. Diabetes Mellitus? Patient diabetic takes Metformin (2), Glipizide (1) day stop 5. Joint replacements in the past 12 months?no 6. Major health problems in the past 3 months?no 7. Any artificial heart valves, MVP, or defibrillator?no    MEDICATIONS & ALLERGIES:    Patient reports the following regarding taking any anticoagulation/antiplatelet therapy:   Plavix, Coumadin, Eliquis, Xarelto, Lovenox, Pradaxa, Brilinta, or Effient? no Aspirin? no  Patient confirms/reports the following medications:  Current Outpatient Medications  Medication Sig Dispense Refill   acetaminophen (TYLENOL) 500 MG tablet Take 500 mg by mouth every 4 (four) hours as needed.     Blood Glucose Monitoring Suppl (ONETOUCH VERIO FLEX SYSTEM) w/Device KIT by Does not apply route.     glipiZIDE (GLUCOTROL XL) 10 MG 24 hr tablet Take 10 mg by mouth 2 (two) times daily.     losartan (COZAAR) 50 MG tablet Take 50 mg by mouth daily.     metFORMIN (GLUCOPHAGE-XR) 500 MG 24 hr tablet Take 1,000 mg by mouth 2 (two) times daily.     metoprolol succinate (TOPROL-XL) 50 MG 24 hr tablet TAKE 1 TABLET BY MOUTH ONCE DAILY WITH  OR  IMMEDIATELY  FOLLOWING  A  MEAL 90 tablet 1   Multiple Vitamin (MULTIVITAMIN) tablet Take 1 tablet by mouth daily.     empagliflozin (JARDIANCE) 10 MG TABS tablet Take 1 tablet by mouth every morning. (Patient not taking: Reported on 01/31/2023)     No current facility-administered medications for this visit.    Patient confirms/reports the following allergies:  Allergies  Allergen Reactions   Statins  Other (See Comments)    Loss of balance   Lisinopril Other (See Comments)    hoarseness    No orders of the defined types were placed in this encounter.   AUTHORIZATION INFORMATION Primary Insurance: 1D#: Group #:  Secondary Insurance: 1D#: Group #:  SCHEDULE INFORMATION: Date: 02/18/23 Time: Location: [facility]

## 2023-02-01 ENCOUNTER — Inpatient Hospital Stay: Payer: 59 | Attending: Oncology | Admitting: Oncology

## 2023-02-01 ENCOUNTER — Inpatient Hospital Stay: Payer: 59

## 2023-02-01 ENCOUNTER — Telehealth: Payer: Self-pay | Admitting: Radiation Oncology

## 2023-02-01 ENCOUNTER — Encounter: Payer: Self-pay | Admitting: Oncology

## 2023-02-01 VITALS — BP 124/80 | HR 77 | Temp 97.8°F | Resp 18 | Ht 73.0 in | Wt 166.4 lb

## 2023-02-01 DIAGNOSIS — Z7189 Other specified counseling: Secondary | ICD-10-CM

## 2023-02-01 DIAGNOSIS — C029 Malignant neoplasm of tongue, unspecified: Secondary | ICD-10-CM | POA: Diagnosis present

## 2023-02-01 DIAGNOSIS — F17210 Nicotine dependence, cigarettes, uncomplicated: Secondary | ICD-10-CM | POA: Insufficient documentation

## 2023-02-01 NOTE — Telephone Encounter (Signed)
Called patient to schedule a consultation w. Dr. Basilio Cairo. Patient stated he would prefer to be seen at [place] to be closer to home. Closing referral until further notice, [facility] and Dr. Basilio Cairo notified.

## 2023-02-01 NOTE — Progress Notes (Signed)
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  He was subsequently seen by Dr. Lou Cal from ENT surgery at [facility] and underwent CT soft tissue neck as well as CT chest.  CT soft tissue neck showed ulcerated lesion involving the anterior lateral tongue measuring 1 x 3.4 cm in the axial plane.  1.9 cm in the coronal plane.  Relative sparing of the tongue base and floor of mouth.  Parapharyngeal and retropharyngeal spaces appear intact.  Small subcentimeter lymph nodes within bilateral neck which were nonspecific.  CT chest showed subcentimeter lung nodules that were nonspecific.  He had a biopsy of this tongue mass which was consistent with squamous cell carcinoma moderately differentiated.  Patient has been referred by his primary care provider to medical oncology for further management.  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   Onychomycosis 05/14/2019   Hyperlipidemia associated with type 2 diabetes mellitus  (HCC) 05/05/2018   Hypertension associated with diabetes (HCC) 05/05/2018   Uncontrolled type 2 diabetes mellitus with hyperglycemia (HCC) 05/05/2018   Cigarette nicotine dependence with nicotine-induced disorder 05/15/2015   Renal stone 07/22/2014     Past Medical History:  Diagnosis Date   Diabetes mellitus without complication (HCC)    Hyperlipidemia    Hypertension    Kidney stones      Past Surgical History:  Procedure Laterality Date   COLONOSCOPY     CYST REMOVAL NECK      Social History   Socioeconomic History   Marital status: Divorced    Spouse name: Not on file   Number of children: Not on file   Years of education: Not on file   Highest education level: Not on file  Occupational History   Not on file  Tobacco Use   Smoking status: Every Day    Current packs/day: 1.00    Average packs/day: 1 pack/day for 50.0 years (50.0 ttl pk-yrs)    Types: Cigarettes   Smokeless tobacco: Never  Vaping Use   Vaping status: Never Used  Substance and Sexual Activity   Alcohol use: Yes    Alcohol/week: 1.0 standard drink of alcohol    Types: 1 Cans of beer per week    Comment: Rare   Drug use: Never   Sexual activity: Not on file  Other Topics Concern   Not on file  Social History Narrative   Not on file   Social Determinants of Health   Financial Resource Strain: Low Risk  (  10/14/2022)   Received from [facility], [facility]   Overall Financial Resource Strain (CARDIA)    Difficulty of Paying Living Expenses: Not very hard  Food Insecurity: Food Insecurity Present (02/01/2023)   Hunger Vital Sign    Worried About Running Out of Food in the Last Year: Sometimes true    Ran Out of Food in the Last Year: Sometimes true  Transportation Needs: No Transportation Needs (02/01/2023)   PRAPARE - Administrator, Civil Service (Medical): No    Lack of Transportation (Non-Medical): No  Physical Activity: Unknown (10/14/2022)   Received from [facility],  [facility]   Exercise Vital Sign    Days of Exercise per Week: 0 days    Minutes of Exercise per Session: Not on file  Stress: No Stress Concern Present (10/14/2022)   Received from [facility], [facility] of Occupational Health - Occupational Stress Questionnaire    Feeling of Stress : Not at all  Social Connections: Somewhat Isolated (10/14/2022)   Received from [facility], [facility]   Social Network    How would you rate your social network (family, work, friends)?: Restricted participation with some degree of social isolation  Intimate Partner Violence: Not At Risk (02/01/2023)   Humiliation, Afraid, Rape, and Kick questionnaire    Fear of Current or Ex-Partner: No    Emotionally Abused: No    Physically Abused: No    Sexually Abused: No     Family History  Problem Relation Age of Onset   Hypertension Mother    Alzheimer's disease Mother    Alzheimer's disease Father    Heart disease Sister    Diabetes type I Brother      Current Outpatient Medications:    acetaminophen (TYLENOL) 500 MG tablet, Take 500 mg by mouth every 4 (four) hours as needed., Disp: , Rfl:    Blood Glucose Monitoring Suppl (ONETOUCH VERIO FLEX SYSTEM) w/Device KIT, by Does not apply route., Disp: , Rfl:    glipiZIDE (GLUCOTROL XL) 10 MG 24 hr tablet, Take 10 mg by mouth 2 (two) times daily., Disp: , Rfl:    losartan (COZAAR) 50 MG tablet, Take 50 mg by mouth daily., Disp: , Rfl:    metFORMIN (GLUCOPHAGE-XR) 500 MG 24 hr tablet, Take 1,000 mg by mouth 2 (two) times daily., Disp: , Rfl:    metoprolol succinate (TOPROL-XL) 50 MG 24 hr tablet, TAKE 1 TABLET BY MOUTH ONCE DAILY WITH  OR  IMMEDIATELY  FOLLOWING  A  MEAL, Disp: 90 tablet, Rfl: 1   Multiple Vitamin (MULTIVITAMIN) tablet, Take 1 tablet by mouth daily., Disp: , Rfl:    empagliflozin (JARDIANCE) 10 MG TABS tablet, Take 1 tablet by mouth every morning. (Patient not taking: Reported on 01/31/2023), Disp: , Rfl:     Physical exam:  Vitals:   02/01/23 1346  BP: 124/80  Pulse: 77  Resp: 18  Temp: 97.8 F (36.6 C)  TempSrc: Tympanic  SpO2: 96%  Weight: 166 lb 6.4 oz (75.5 kg)  Height: 6\' 1"  (1.854 m)   Physical Exam HENT:     Mouth/Throat:     Comments: There is a deep ulcer roughly measuring 4 cm involving the left tongue with superficial areas of separation. Cardiovascular:     Rate and Rhythm: Normal rate and regular rhythm.     Heart sounds: Normal heart sounds.  Pulmonary:     Effort: Pulmonary effort is normal.     Breath sounds:  Normal breath sounds.  Abdominal:     General: Bowel sounds are normal.     Palpations: Abdomen is soft.  Lymphadenopathy:     Comments: No palpable cervical adenopathy  Skin:    General: Skin is warm and dry.  Neurological:     Mental Status: He is alert and oriented to person, place, and time.           Latest Ref Rng & Units 12/23/2022   10:57 AM  CMP  Glucose 70 - 99 mg/dL 409   BUN 8 - 23 mg/dL 34   Creatinine 8.11 - 1.24 mg/dL 9.14   Sodium 782 - 956 mmol/L 142   Potassium 3.5 - 5.1 mmol/L 3.9   Chloride 98 - 111 mmol/L 110   CO2 22 - 32 mmol/L 19   Calcium 8.9 - 10.3 mg/dL 9.0   Total Protein 6.5 - 8.1 g/dL 6.6   Total Bilirubin 0.3 - 1.2 mg/dL 0.5   Alkaline Phos 38 - 126 U/L 39   AST 15 - 41 U/L 42   ALT 0 - 44 U/L 18       Latest Ref Rng & Units 12/23/2022   10:57 AM  CBC  WBC 4.0 - 10.5 K/uL 7.8   Hemoglobin 13.0 - 17.0 g/dL 21.3   Hematocrit 08.6 - 52.0 % 44.6   Platelets 150 - 400 K/uL 177     No images are attached to the encounter.  No results found.  Assessment and plan- Patient is a 67 y.o. 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  I have discussed the results of the CT with the patient in detail which shows a ulcerated tongue lesion about 3.5 cm which is well  visible on clinical exam as well.  This was biopsied and consistent with squamous cell carcinoma.  There is no apparent local regional adenopathy or metastatic disease seen in the lungs.  I explained to him that this likely constitutes stage II tongue cancer and treatment would be upfront surgery which would involve hemiglossectomy and reconstruction and lymph node sampling.  I would like the patient to see Dr. Lou Cal from [facility] who he has seen before to discuss upfront surgery which is the standard of care and early-stage tongue cancer.  We do not have any head neck surgery here in [place] or in [place].  His other option would be referral to Dr. Roma Schanz at [facility]  Patient would like to go back to [facility] and we will reach out to Dr.Slijepcevic as well to see if his appointment can be moved up.  He states that his appointment with her is not until August 7.  I am deferring any decision for PET scan to ENT surgery at this time.  If patient decides not to opt for surgery then he can get radiation but that would be her second best option and surgery is still preferable.  I can refer him to radiation oncology here at [facility] if he turns down surgery.  No role for chemotherapy at this time.  Patient can be referred back to me following his surgery if there are adverse features on his pathology such as positive margins or extreme nuclear extension that would require concurrent chemoradiation adjuvantly.  Patient verbalized understanding of my plan   Cancer Staging  Tongue  cancer [facility]) Staging form: Oral Cavity, AJCC 8th Edition - Clinical stage from 02/01/2023: Stage II (cT2, cN0, cM0) - Signed by Creig Hines, MD on 02/01/2023 Histologic grade (G): G2 Histologic grading system: 3 grade system     Thank you for this kind referral and the opportunity to participate in the care of this patient   Visit Diagnosis 1. Tongue cancer (HCC)   2. Goals of care,  counseling/discussion     Dr. Owens Shark, MD, MPH [facility] [phone] 02/01/2023

## 2023-02-09 DIAGNOSIS — C029 Malignant neoplasm of tongue, unspecified: Secondary | ICD-10-CM | POA: Insufficient documentation

## 2023-02-16 ENCOUNTER — Telehealth: Payer: Self-pay

## 2023-02-16 DIAGNOSIS — Z1211 Encounter for screening for malignant neoplasm of colon: Secondary | ICD-10-CM

## 2023-02-16 NOTE — Telephone Encounter (Signed)
Patient has been contacted to reschedule his colonoscopy due to taking BC Powder.  He started taking 200 mg of BC Powder a few days ago to help with the tongue pain that he has been experiencing. Recently diagnosed with tongue cancer.  Exposed to contaminated water when he was living at [facility] in the 80tys. Tongue surgery schedule at the end of Aug. Sister just recently diagnosed with stage 4 colon cancer.  Wants to reschedule after recovery from surgery. Colonoscopy has been rescheduled to 10/08.  Marcelino Duster, CMA

## 2023-03-14 HISTORY — PX: TRACHEOSTOMY: SUR1362

## 2023-03-14 HISTORY — PX: OTHER SURGICAL HISTORY: SHX169

## 2023-03-24 ENCOUNTER — Other Ambulatory Visit: Payer: Self-pay | Admitting: *Deleted

## 2023-03-24 ENCOUNTER — Telehealth: Payer: Self-pay | Admitting: *Deleted

## 2023-03-24 DIAGNOSIS — C029 Malignant neoplasm of tongue, unspecified: Secondary | ICD-10-CM

## 2023-03-24 NOTE — Telephone Encounter (Signed)
We got a referral and the pt has had surgery from tongue cancer. He wants to come back to see [place] and dr Smith Robert say 9/10 at 8:45. Also dr Smith Robert wanted to know if he needs radiation. Pt does not know that . He does have an appt on Friday. I asked if it was ok to add on radiation MD just in case he needs it. He is agreeable with this.

## 2023-03-29 ENCOUNTER — Other Ambulatory Visit: Payer: Self-pay | Admitting: *Deleted

## 2023-03-29 ENCOUNTER — Encounter: Payer: Self-pay | Admitting: Oncology

## 2023-03-29 ENCOUNTER — Ambulatory Visit: Payer: 59 | Admitting: Radiation Oncology

## 2023-03-29 ENCOUNTER — Inpatient Hospital Stay: Payer: 59 | Attending: Oncology | Admitting: Oncology

## 2023-03-29 VITALS — BP 93/54 | HR 64 | Temp 96.1°F | Resp 18 | Wt 160.0 lb

## 2023-03-29 DIAGNOSIS — C029 Malignant neoplasm of tongue, unspecified: Secondary | ICD-10-CM | POA: Insufficient documentation

## 2023-03-29 DIAGNOSIS — F17210 Nicotine dependence, cigarettes, uncomplicated: Secondary | ICD-10-CM | POA: Diagnosis not present

## 2023-03-29 MED ORDER — DEXAMETHASONE 4 MG PO TABS: ORAL_TABLET | ORAL | 1 refills | Status: DC

## 2023-03-29 MED ORDER — PROCHLORPERAZINE MALEATE 10 MG PO TABS: 10.0000 mg | ORAL_TABLET | Freq: Four times a day (QID) | ORAL | 1 refills | Status: DC | PRN

## 2023-03-29 MED ORDER — LIDOCAINE-PRILOCAINE 2.5-2.5 % EX CREA: TOPICAL_CREAM | CUTANEOUS | 3 refills | Status: DC

## 2023-03-29 MED ORDER — ONDANSETRON HCL 8 MG PO TABS: 8.0000 mg | ORAL_TABLET | Freq: Three times a day (TID) | ORAL | 1 refills | Status: DC | PRN

## 2023-03-29 NOTE — Progress Notes (Signed)
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  He was subsequently seen by Dr. Lou Cal from ENT surgery at [facility] and underwent CT soft tissue neck as well as CT chest.  CT soft tissue neck showed ulcerated lesion involving the anterior lateral tongue measuring 1 x 3.4 cm in the axial plane.  1.9 cm in the coronal plane.  Relative sparing of the tongue base and floor of mouth.  Parapharyngeal and retropharyngeal spaces appear intact.  Small subcentimeter lymph nodes within bilateral neck which were nonspecific.  CT chest showed subcentimeter lung nodules that were nonspecific.  He had a biopsy of this tongue mass which was consistent with squamous cell carcinoma moderately differentiated.   He was seen by [facility] and underwent glossectomy and modified neck dissection on 03/14/2023 with tracheostomy and NG tube placement. Final Diagnosis  A. 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   B. LEFT SUBMANDIBULAR CONTENTS, DISSECTION:               Benign submandibular gland; negative for carcinoma.              Three benign lymph nodes, negative for metastatic carcinoma (0/3).               C. SUBMENTAL CONTENTS, DISSECTION:                Four benign lymph nodes, negative for metastatic carcinoma (0/4).   D. RIGHT SUBMANDIBULAR CONTENTS, EXCISION:               Benign submandibular tissue.              Negative for carcinoma.   E. PAROTID GLAND, LEFT TAIL, EXCISION:               [place] tumor (1.1 cm); the tumor appears completely excised.              Negative for carcinoma.              Four benign lymph nodes, negative for metastatic carcinoma (0/4).   F. LEFT NECK CONTENTS LEVELS 2A, 3, 4 AND 5, DISSECTION:               Eight out of thirty-one lymph nodes, positive for metastatic squamous cell carcinoma (8/31).                           Size of largest nodal metastatic deposit: 3.3 cm. Extranodal extension: Identified. Benign salivary gland tissue; negative for malignancy.               G. LEFT NECK CONTENTS LEVELS 2B, DISSECTION:               Nine benign lymph nodes,  negative for metastatic carcinoma (0/9).    H. NODULE AT LEFT CAROTID BULB, EXCISION:               One benign lymph node, positive for metastatic squamous cell carcinoma (1/1). Size of metastatic deposit: 0.4 cm. Extranodal extension: Not identified.   I. TONGUE, LEFT, GLOSSECTOMY  :               Invasive squamous cell carcinoma, keratinizing, poorly differentiated with tumor necrosis.                           Tumor size: 5.0 cm in greatest dimension.                           Depth of invasion: 12 mm                           Lymphovascular and perineural invasion: Identified.                           Margins: Negative                           Pathologic stage: pT4a, pN3b.                           Please see CAP synoptic report.               J. TEETH 1,2,14,15, and 19, EXTRACTION:               Teeth (gross only).      Interval history-patient is gradually recovering from his surgery.  He has not had a bowel movement for last 1 week.  Denies any significant pain  ECOG PS- 1 Pain scale- 0   Review of systems- Review of Systems   Constitutional:  Negative for chills, fever, malaise/fatigue and weight loss.  HENT:  Negative for congestion, ear discharge and nosebleeds.   Eyes:  Negative for blurred vision.  Respiratory:  Negative for cough, hemoptysis, sputum production, shortness of breath and wheezing.   Cardiovascular:  Negative for chest pain, palpitations, orthopnea and claudication.  Gastrointestinal:  Negative for abdominal pain, blood in stool, constipation, diarrhea, heartburn, melena, nausea and vomiting.  Genitourinary:  Negative for dysuria, flank pain, frequency, hematuria and urgency.  Musculoskeletal:  Negative for back pain, joint pain and myalgias.  Skin:  Negative for rash.  Neurological:  Negative for dizziness, tingling, focal weakness, seizures, weakness and headaches.  Endo/Heme/Allergies:  Does not bruise/bleed easily.  Psychiatric/Behavioral:  Negative for depression and suicidal ideas. The patient does not have insomnia.       Allergies  Allergen Reactions   Statins Other (See Comments)    Loss of balance   Lisinopril Other (See Comments)    hoarseness     Past Medical History:  Diagnosis Date   Diabetes mellitus without complication (HCC)    Hyperlipidemia    Hypertension    Kidney stones      Past Surgical History:  Procedure Laterality Date   COLONOSCOPY     CYST REMOVAL NECK      Social History   Socioeconomic History   Marital status: Divorced    Spouse name: Not on file   Number of children: Not on  file   Years of education: Not on file   Highest education level: Not on file  Occupational History   Not on file  Tobacco Use   Smoking status: Every Day    Current packs/day: 1.00    Average packs/day: 1 pack/day for 50.0 years (50.0 ttl pk-yrs)    Types: Cigarettes   Smokeless tobacco: Never  Vaping Use   Vaping status: Never Used  Substance and Sexual Activity   Alcohol use: Yes    Alcohol/week: 1.0 standard drink of alcohol    Types: 1 Cans of beer per  week    Comment: Rare   Drug use: Never   Sexual activity: Not on file  Other Topics Concern   Not on file  Social History Narrative   Not on file   Social Determinants of Health   Financial Resource Strain: Patient Declined (02/18/2023)   Received from [facility]   Overall Financial Resource Strain (CARDIA)    Difficulty of Paying Living Expenses: Patient declined  Food Insecurity: Medium Risk (03/21/2023)   Received from [facility]   Hunger Vital Sign    Worried About Running Out of Food in the Last Year: Sometimes true    Ran Out of Food in the Last Year: Never true  Transportation Needs: No Transportation Needs (03/21/2023)   Received from [facility]    In the past 12 months, has lack of reliable transportation kept you from medical appointments, meetings, work or from getting things needed for daily living? : No  Physical Activity: Insufficiently Active (02/18/2023)   Received from [facility]   Exercise Vital Sign    Days of Exercise per Week: 3 days    Minutes of Exercise per Session: 30 min  Stress: No Stress Concern Present (02/18/2023)   Received from [facility] - Occupational Stress Questionnaire    Feeling of Stress : Only a little  Social Connections: Moderately Integrated (02/18/2023)   Received from [facility]   Social Network    How would you rate your social network (family, work, friends)?: Adequate participation with social networks  Intimate Partner Violence: Not At Risk (02/18/2023)   Received from [facility]   HITS    Over the last 12 months how often did your partner physically hurt you?: 1    Over the last 12 months how often did your partner insult you or talk down to you?: 1    Over the last 12 months how often did your partner threaten you with physical harm?: 1    Over the last 12 months how often did your partner scream or curse at you?: 1    Family History  Problem Relation  Age of Onset   Hypertension Mother    Alzheimer's disease Mother    Alzheimer's disease Father    Heart disease Sister    Diabetes type I Brother      Current Outpatient Medications:    acetaminophen (TYLENOL) 500 MG tablet, Take 500 mg by mouth every 4 (four) hours as needed., Disp: , Rfl:    amoxicillin-clavulanate (AUGMENTIN) 250-62.5 MG/5ML suspension, Take by mouth., Disp: , Rfl:    amoxicillin-clavulanate (AUGMENTIN) 875-125 MG tablet, Take by mouth., Disp: , Rfl:    aspirin 325 MG tablet, 1 tablet (325 mg total) by G-tube route daily for 22 days., Disp: , Rfl:    Blood Glucose Monitoring Suppl (ONETOUCH VERIO FLEX SYSTEM) w/Device KIT, by Does not  apply route., Disp: , Rfl:    glipiZIDE (GLUCOTROL XL) 10 MG 24 hr tablet, Take 10 mg by mouth 2 (two) times daily., Disp: , Rfl:    glucose blood test strip, Use daily. Pharmacy, please dispense this brand of blood glucose test strips: Other: One Touch Verio Flex, Disp: , Rfl:    insulin lispro (HUMALOG) 100 UNIT/ML KwikPen, Inject into the skin., Disp: , Rfl:    losartan (COZAAR) 50 MG tablet, Take 50 mg by mouth daily., Disp: , Rfl:    metFORMIN (GLUCOPHAGE-XR) 500 MG 24 hr tablet, Take 1,000 mg by mouth 2 (two) times daily., Disp: , Rfl:    metoprolol succinate (TOPROL-XL) 50 MG 24 hr tablet, TAKE 1 TABLET BY MOUTH ONCE DAILY WITH  OR  IMMEDIATELY  FOLLOWING  A  MEAL, Disp: 90 tablet, Rfl: 1   Multiple Vitamin (MULTIVITAMIN) tablet, Take 1 tablet by mouth daily., Disp: , Rfl:    empagliflozin (JARDIANCE) 10 MG TABS tablet, Take 1 tablet by mouth every morning. (Patient not taking: Reported on 01/31/2023), Disp: , Rfl:   Physical exam:  Vitals:   03/29/23 0909  BP: (!) 93/54  Pulse: 64  Resp: 18  Temp: (!) 96.1 F (35.6 C)  TempSrc: Tympanic  SpO2: 97%  Weight: 160 lb (72.6 kg)   Physical Exam HENT:     Mouth/Throat:     Comments: Patient is status post glossectomy with flap reconstruction.  Tracheostomy in place NG tube in  place Cardiovascular:     Rate and Rhythm: Normal rate and regular rhythm.     Heart sounds: Normal heart sounds.  Pulmonary:     Effort: Pulmonary effort is normal.     Breath sounds: Normal breath sounds.  Abdominal:     General: Bowel sounds are normal.     Palpations: Abdomen is soft.  Skin:    General: Skin is warm and dry.  Neurological:     Mental Status: He is alert and oriented to person, place, and time.         Latest Ref Rng & Units 12/23/2022   10:57 AM  CMP  Glucose 70 - 99 mg/dL 387   BUN 8 - 23 mg/dL 34   Creatinine 5.64 - 1.24 mg/dL 3.32   Sodium 951 - 884 mmol/L 142   Potassium 3.5 - 5.1 mmol/L 3.9   Chloride 98 - 111 mmol/L 110   CO2 22 - 32 mmol/L 19   Calcium 8.9 - 10.3 mg/dL 9.0   Total Protein 6.5 - 8.1 g/dL 6.6   Total Bilirubin 0.3 - 1.2 mg/dL 0.5   Alkaline Phos 38 - 126 U/L 39   AST 15 - 41 U/L 42   ALT 0 - 44 U/L 18       Latest Ref Rng & Units 12/23/2022   10:57 AM  CBC  WBC 4.0 - 10.5 K/uL 7.8   Hemoglobin 13.0 - 17.0 g/dL 16.6   Hematocrit 06.3 - 52.0 % 44.6   Platelets 150 - 400 K/uL 177     No images are attached to the encounter.  No results found.   Assessment and plan- Patient is a 67 y.o. 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  8 out of 31 lymph nodes were involved with malignancy with extracapsular extension.  T4a N3b stage IVb disease.  I have discussed this case with radiation oncology and they would like a PET scan at this time which I will schedule.  Given that he has multiple risk factors including positive lymph nodes and extracapsular extension he would benefit from adjuvant chemoradiation.  I would recommend weekly cisplatin given at 40 mg/m along with 7 weeks of radiation.  He will be seeing radiation oncology after PET  scan.  Discussed acetaminophens of chemotherapy including all but not limited to nausea, vomiting, low blood counts, risk of infections and hospitalizations as well as hearing loss and acute kidney injury associated with cisplatin.  Treatment will be given with a potential curative intent.  Patient understands and agrees to proceed as planned.   We will plan for port placement chemo teach.  He currently has an NG tube in place which may be taken out soon.  If he is unable to keep up with his nutrition especially during chemoradiation he may require a PEG tube.  I will also refer him to nutrition assessment at this time.  Plan to tentatively start chemotherapy in about 3 weeks from now   Cancer Staging  Tongue cancer [facility]) Staging form: Oral Cavity, AJCC 8th Edition - Clinical stage from 02/01/2023: Stage II (cT2, cN0, cM0) - Signed by Creig Hines, MD on 02/01/2023 Histologic grade (G): G2 Histologic grading system: 3 grade system - Pathologic stage from 03/29/2023: Stage IVB (pT4a, pN3b, cM0) - Signed by Creig Hines, MD on 03/29/2023      Visit Diagnosis 1. Tongue cancer (HCC)      Dr. Owens Shark, MD, MPH [facility] [phone] 03/29/2023 12:30 PM

## 2023-03-29 NOTE — Progress Notes (Signed)
START ON PATHWAY REGIMEN - Head and Neck     A cycle is every 7 days:     Cisplatin   **Always confirm dose/schedule in your pharmacy ordering system**  Patient Characteristics: Oral Cavity, Postoperative without Neoadjuvant Therapy, High Risk Disease Classification: Oral Cavity AJCC T Category: T4a AJCC M Category: M0 AJCC N Category: cN3b AJCC 8 Stage Grouping: IVB Therapeutic Status: Postoperative without Neoadjuvant Therapy Risk Status: High Risk Intent of Therapy: Curative Intent, Discussed with Patient

## 2023-03-30 ENCOUNTER — Other Ambulatory Visit: Payer: Self-pay

## 2023-04-04 ENCOUNTER — Ambulatory Visit: Payer: 59 | Admitting: Radiation Oncology

## 2023-04-06 ENCOUNTER — Inpatient Hospital Stay: Payer: 59 | Admitting: Nutrition

## 2023-04-06 ENCOUNTER — Ambulatory Visit
Admission: RE | Admit: 2023-04-06 | Discharge: 2023-04-06 | Disposition: A | Discharge status: Home / Self Care | Payer: 59 | Source: Ambulatory Visit | Attending: Oncology | Admitting: Oncology

## 2023-04-06 DIAGNOSIS — Z930 Tracheostomy status: Secondary | ICD-10-CM | POA: Insufficient documentation

## 2023-04-06 DIAGNOSIS — C770 Secondary and unspecified malignant neoplasm of lymph nodes of head, face and neck: Secondary | ICD-10-CM | POA: Insufficient documentation

## 2023-04-06 DIAGNOSIS — C029 Malignant neoplasm of tongue, unspecified: Secondary | ICD-10-CM | POA: Insufficient documentation

## 2023-04-06 LAB — GLUCOSE, CAPILLARY: Glucose-Capillary: 212 mg/dL — ABNORMAL HIGH (ref 70–99)

## 2023-04-06 MED ORDER — FLUDEOXYGLUCOSE F - 18 (FDG) INJECTION
8.7400 | Freq: Once | INTRAVENOUS | Status: AC | PRN
  Administered 2023-04-06: 8.74 via INTRAVENOUS

## 2023-04-06 NOTE — Progress Notes (Signed)
Telephone call completed with 67 yo male patient of Dr. Smith Robert.   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 Tracheostomy in place. NG tube in place.   Home Health Company: [facility] for cisplatin and radiation.  PMH includes DM, HLD, HTN, Kidney stones.  Medications include Glucotrol XL, Humalog, Glucophage XR, MVI, Zofran, Compazine, Decadron.  Labs include glucose of 212 today.  Height: 6'1". Weight: 160 pounds. UBW: 200 pounds per patient.  Weighed 219 pounds 6 oz in March 2023. BMI:21.11.  Estimated Nutrition Needs: 2200-2500 kcal, 100-125 gm protein, >2.2 L  TF: 6 cartons of Osmolite 1.5 provides 2130 kcal, 89.4 gm protein, 1806-1926 mL free water. Provides 97% minimum calorie needs and 89% minimum protein needs.  Patient uses NG tube for bolus feeding. He is giving 1 carton at a time but sometimes gives a carton every hour over 3 hours. (3 cartons in 3 hours). States he uses 60 mL free water before and after feeding. Reports his goal rate is 6 cartons daily of Osmolite 1.5. He is having a BM daily. He has difficulty swallowing and reports he is seeing Speech who will help him with his swallowing. He is eating yogurt and drinking Ensure and water. He thinks he can tolerate blenderized foods. I am not clear if he has been given the okay to eat by mouth. Denies nausea and vomiting.  Nutrition Diagnosis: Unintended wt loss related to cancer and associated treatments as evidenced by 20% wt loss from UBW.  Intervention: Educated to continue Osmolite 1.5, via NG, 6 cartons daily as tolerated. Give free water as directed. Clarify diet with MD/Speech Therapist. Recommended he remain NPO until diet clarified by MD or Speech Therapist. Bowel Regimen. RD will follow up with patient and clarify diet and TF needs and adjust TF as needed to meet > 90% estimated nutrition needs. May need  additional protein.  Monitoring, Evaluation, Goals: Patient will tolerate adequate calories and protein to minimize wt loss.  Next Visit: Scheduled for Monday Sept 30.

## 2023-04-08 ENCOUNTER — Ambulatory Visit: Payer: 59 | Admitting: Radiology

## 2023-04-08 NOTE — Progress Notes (Signed)
Pharmacist Chemotherapy Monitoring - Initial Assessment    Anticipated start date: 04/18/23   The following has been reviewed per standard work regarding the patient's treatment regimen: The patient's diagnosis, treatment plan and drug doses, and organ/hematologic function Lab orders and baseline tests specific to treatment regimen  The treatment plan start date, drug sequencing, and pre-medications Prior authorization status  Patient's documented medication list, including drug-drug interaction screen and prescriptions for anti-emetics and supportive care specific to the treatment regimen The drug concentrations, fluid compatibility, administration routes, and timing of the medications to be used The patient's access for treatment and lifetime cumulative dose history, if applicable  The patient's medication allergies and previous infusion related reactions, if applicable   Changes made to treatment plan:  N/A  Follow up needed:  N/A  Ebony Hail, Pharm.D., CPP 04/08/2023@4 :20 PM

## 2023-04-11 ENCOUNTER — Inpatient Hospital Stay: Payer: 59

## 2023-04-12 ENCOUNTER — Telehealth: Payer: Self-pay | Admitting: *Deleted

## 2023-04-12 ENCOUNTER — Other Ambulatory Visit: Payer: Self-pay | Admitting: Radiation Oncology

## 2023-04-12 ENCOUNTER — Ambulatory Visit
Admission: RE | Admit: 2023-04-12 | Discharge: 2023-04-12 | Disposition: A | Discharge status: Home / Self Care | Payer: 59 | Source: Ambulatory Visit | Attending: Radiation Oncology | Admitting: Radiation Oncology

## 2023-04-12 ENCOUNTER — Encounter: Payer: Self-pay | Admitting: Radiation Oncology

## 2023-04-12 VITALS — BP 104/68 | HR 96 | Temp 95.7°F | Resp 15 | Ht 73.0 in

## 2023-04-12 DIAGNOSIS — Z7984 Long term (current) use of oral hypoglycemic drugs: Secondary | ICD-10-CM | POA: Diagnosis not present

## 2023-04-12 DIAGNOSIS — Z79899 Other long term (current) drug therapy: Secondary | ICD-10-CM | POA: Insufficient documentation

## 2023-04-12 DIAGNOSIS — C023 Malignant neoplasm of anterior two-thirds of tongue, part unspecified: Secondary | ICD-10-CM

## 2023-04-12 DIAGNOSIS — I10 Essential (primary) hypertension: Secondary | ICD-10-CM | POA: Diagnosis not present

## 2023-04-12 DIAGNOSIS — E119 Type 2 diabetes mellitus without complications: Secondary | ICD-10-CM | POA: Insufficient documentation

## 2023-04-12 DIAGNOSIS — E785 Hyperlipidemia, unspecified: Secondary | ICD-10-CM | POA: Insufficient documentation

## 2023-04-12 DIAGNOSIS — Z87442 Personal history of urinary calculi: Secondary | ICD-10-CM | POA: Insufficient documentation

## 2023-04-12 DIAGNOSIS — Z87891 Personal history of nicotine dependence: Secondary | ICD-10-CM | POA: Insufficient documentation

## 2023-04-12 DIAGNOSIS — Z7952 Long term (current) use of systemic steroids: Secondary | ICD-10-CM | POA: Insufficient documentation

## 2023-04-12 DIAGNOSIS — C029 Malignant neoplasm of tongue, unspecified: Secondary | ICD-10-CM

## 2023-04-12 DIAGNOSIS — Z7982 Long term (current) use of aspirin: Secondary | ICD-10-CM | POA: Insufficient documentation

## 2023-04-12 NOTE — H&P (Signed)
Chief Complaint: Patient was seen in consultation today for port a catheter placement at the request of Rao,Archana C  Referring Physician(s): Rao,Archana C  Supervising Physician: Irish Lack  Patient Status: [facility] - Out-pt  History of Present Illness: Shane Perry is a 67 y.o. 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   Past Medical History:  Diagnosis Date   Diabetes mellitus without complication (HCC)    Hyperlipidemia    Hypertension    Kidney stones     Past Surgical History:  Procedure Laterality Date   COLONOSCOPY     CYST REMOVAL NECK      Allergies: Statins and Lisinopril  Medications: Prior to Admission medications   Medication Sig Start Date End Date Taking? Authorizing Provider  acetaminophen (TYLENOL) 500 MG tablet Take 500 mg by mouth every 4 (four) hours as needed.    [provider]  amoxicillin-clavulanate (AUGMENTIN) 250-62.5 MG/5ML suspension Take by mouth. 03/25/23 04/14/23  [provider]  aspirin 325 MG tablet 1 tablet (325 mg total) by G-tube route daily for 22 days. 03/22/23 04/13/23  [provider]  Blood Glucose Monitoring Suppl (ONETOUCH VERIO FLEX SYSTEM) w/Device KIT by Does not apply route. 10/14/22 10/14/23  [provider]  dexamethasone (DECADRON) 4 MG tablet Take 2 tablets (8 mg) by mouth daily x 3 days starting the day after cisplatin chemotherapy. Take with food. 03/29/23   Creig Hines, MD  empagliflozin (JARDIANCE) 10 MG TABS tablet Take 1 tablet by mouth every morning. Patient not taking: Reported on 01/31/2023 01/30/21   [provider]  glipiZIDE (GLUCOTROL XL) 10 MG 24 hr tablet Take 10 mg by mouth 2 (two) times daily. 04/01/21   [provider]  glucose blood test strip Use daily. Pharmacy, please dispense this brand of blood glucose test strips:  Other: One Touch Verio Flex 02/10/23 02/10/24  [provider]  lidocaine-prilocaine (EMLA) cream Apply to affected area once 03/29/23   Creig Hines, MD  losartan (COZAAR) 50 MG tablet Take 50 mg by mouth daily. 08/11/21   [provider]  metFORMIN (GLUCOPHAGE-XR) 500 MG 24 hr tablet Take 1,000 mg by mouth 2 (two) times daily. 03/19/21   [provider]  metoprolol succinate (TOPROL-XL) 50 MG 24 hr tablet TAKE 1 TABLET BY MOUTH ONCE DAILY WITH  OR  IMMEDIATELY  FOLLOWING  A  MEAL 05/13/22   Gollan, Tollie Pizza, MD  Multiple Vitamin (MULTIVITAMIN) tablet Take 1 tablet by mouth daily.    [provider]  ondansetron (ZOFRAN) 8 MG tablet Take 1 tablet (8 mg total) by mouth every 8 (eight) hours as needed for nausea or vomiting. Start on the third day after cisplatin. 03/29/23   Creig Hines, MD  prochlorperazine (COMPAZINE) 10 MG tablet Take 1 tablet (10 mg total) by mouth every 6 (six) hours as needed (Nausea or vomiting). 03/29/23   Creig Hines, MD     Family History  Problem Relation Age of Onset   Hypertension Mother    Alzheimer's disease Mother    Alzheimer's disease Father    Heart disease Sister    Diabetes type I Brother     Social History   Socioeconomic History   Marital status: Divorced    Spouse name: Not on file   Number of children: Not on file   Years of education: Not  on file   Highest education level: Not on file  Occupational History   Not on file  Tobacco Use   Smoking status: Every Day    Current packs/day: 1.00    Average packs/day: 1 pack/day for 50.0 years (50.0 ttl pk-yrs)    Types: Cigarettes   Smokeless tobacco: Never  Vaping Use   Vaping status: Never Used  Substance and Sexual Activity   Alcohol use: Yes    Alcohol/week: 1.0 standard drink of alcohol    Types: 1 Cans of beer per week    Comment: Rare   Drug use: Never   Sexual activity: Not on file  Other Topics Concern   Not on file  Social History Narrative    Not on file   Social Determinants of Health   Financial Resource Strain: Patient Declined (02/18/2023)   Received from [facility]   Overall Financial Resource Strain (CARDIA)    Difficulty of Paying Living Expenses: Patient declined  Food Insecurity: Medium Risk (03/21/2023)   Received from [facility]   Hunger Vital Sign    Worried About Running Out of Food in the Last Year: Sometimes true    Ran Out of Food in the Last Year: Never true  Transportation Needs: No Transportation Needs (03/21/2023)   Received from [facility]    In the past 12 months, has lack of reliable transportation kept you from medical appointments, meetings, work or from getting things needed for daily living? : No  Physical Activity: Insufficiently Active (02/18/2023)   Received from [facility]   Exercise Vital Sign    Days of Exercise per Week: 3 days    Minutes of Exercise per Session: 30 min  Stress: No Stress Concern Present (02/18/2023)   Received from [facility] of Occupational Health - Occupational Stress Questionnaire    Feeling of Stress : Only a little  Social Connections: Moderately Integrated (02/18/2023)   Received from [facility]   Social Network    How would you rate your social network (family, work, friends)?: Adequate participation with social networks    Review of Systems: A 12 point ROS discussed and pertinent positives are indicated in the HPI above.  All other systems are negative.  Review of Systems  Vital Signs: There were no vitals taken for this visit.  Advance Care Plan: {Advance Care [id number]}    Physical Exam  Imaging: No results found.  Labs:  CBC: Recent Labs    12/23/22 1057  WBC 7.8  HGB 14.8  HCT 44.6  PLT 177    COAGS: No results for input(s): "INR", "APTT" in the last 8760 hours.  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   The patient has been NPO, imaging, and vitals have been reviewed.  Risks and benefits of image guided port-a-catheter placement was discussed with the patient including, but not limited to bleeding,  infection, pneumothorax, or fibrin sheath development and need for additional procedures.  All of the patient's questions were answered, patient is agreeable to proceed. Consent signed and in chart.   Thank you for this interesting consult.  I greatly enjoyed meeting Shane Perry and look forward to participating in their care.  A copy of this report was sent to the requesting provider on this date.  Electronically Signed: Berneta Levins, PA-C 04/12/2023, 11:42 AM   I spent a total of 15 Minutes in face to face in clinical consultation, greater than 50% of which was counseling/coordinating care for port a catheter placement.

## 2023-04-12 NOTE — Telephone Encounter (Signed)
Called the pt to let him know that the pt will get port in on 9/26 arrival at 9 am and NPO for 8 hours. He only takes liquids through nasogastric tube. I told him to stop at midnight wed 9/25. He has to have a driver to take him home and he has someone to to that he says. I told him that he will need to go to front of hospital  and look for heart and vascular on the outside of the building in the front attached to ER, and [place] [facility]. He thinks he will see it.

## 2023-04-12 NOTE — Consult Note (Signed)
NEW PATIENT EVALUATION  Name: Shane Perry  MRN: [id number]  Date:   04/12/2023     DOB: 1955/08/20   This 68 y.o. 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  REFERRING PHYSICIAN: Arnetha Courser, NP  CHIEF COMPLAINT:  Chief Complaint  Patient presents with   Consult    DIAGNOSIS: The encounter diagnosis was Tongue cancer (HCC).   PREVIOUS INVESTIGATIONS:  PET CT scan reviewed Clinical notes reviewed Pathology reports reviewed  HPI: Patient is a 67 year old male who bit his tongue and saw an ENT surgeon.  CT scan of his neck showed an ulcerated lesion in the anterior lateral tongue measuring 1 x 3.4 cm x 1.9 cm.  He CT scan of chest showed small lung nodules nonspecific.  Biopsy of his tongue was positive for moderately differentiated squamous cell carcinoma.  Patient underwent partial glossectomy modified neck dissection on 03/14/2023.  He had metastatic squamous cell carcinoma in 1 pretracheal lymph node.  He also had 8 out of 31 lymph nodes positive for metastatic squamous cell carcinoma the largest being 3.3 cm with extranodal extension in the left neck levels 2A34 and 5.  Also had a left carotid bulb lymph node positive for squamous cell carcinoma.  The tongue had a 5 cm invasive poorly differentiated squamous cell carcinoma with lymph-vascular perineural invasion positive.  Margins were negative.  Patient is slowly recovering from his surgery.  He is swallowing somewhat.  He is having no head and neck pain at this time.  He has been seen by medical oncology with recommendation for concurrent chemoradiation is seen for radiation oncology opinion.  He had a PET CT scan ordered recently showing a my opinion hypermetabolic activity in the right high cervical chain.  No other evidence of residual disease is noted.  No formal reading of the PET scan has been performed at this  time  PLANNED TREATMENT REGIMEN: Concurrent chemoradiation  PAST MEDICAL HISTORY:  has a past medical history of Diabetes mellitus without complication (HCC), Hyperlipidemia, Hypertension, and Kidney stones.    PAST SURGICAL HISTORY:  Past Surgical History:  Procedure Laterality Date   COLONOSCOPY     CYST REMOVAL NECK      FAMILY HISTORY: family history includes Alzheimer's disease in his father and mother; Diabetes type I in his brother; Heart disease in his sister; Hypertension in his mother.  SOCIAL HISTORY:  reports that he has been smoking cigarettes. He has a 50 pack-year smoking history. He has never used smokeless tobacco. He reports current alcohol use of about 1.0 standard drink of alcohol per week. He reports that he does not use drugs.  ALLERGIES: Statins and Lisinopril  MEDICATIONS:  Current Outpatient Medications  Medication Sig Dispense Refill   acetaminophen (TYLENOL) 500 MG tablet Take 500 mg by mouth every 4 (four) hours as needed.     amoxicillin-clavulanate (AUGMENTIN) 250-62.5 MG/5ML suspension Take by mouth.     aspirin 325 MG tablet 1 tablet (325 mg total) by G-tube route daily for 22 days.     Blood Glucose Monitoring Suppl (ONETOUCH VERIO FLEX SYSTEM) w/Device KIT by Does not apply route.     dexamethasone (DECADRON) 4 MG tablet Take 2 tablets (8 mg) by mouth daily x 3 days starting the day after cisplatin chemotherapy. Take with food. 30 tablet 1   empagliflozin (JARDIANCE) 10 MG TABS tablet Take 1 tablet by mouth every  morning. (Patient not taking: Reported on 01/31/2023)     glipiZIDE (GLUCOTROL XL) 10 MG 24 hr tablet Take 10 mg by mouth 2 (two) times daily.     glucose blood test strip Use daily. Pharmacy, please dispense this brand of blood glucose test strips: Other: One Touch Verio Flex     lidocaine-prilocaine (EMLA) cream Apply to affected area once 30 g 3   losartan (COZAAR) 50 MG tablet Take 50 mg by mouth daily.     metFORMIN (GLUCOPHAGE-XR) 500 MG  24 hr tablet Take 1,000 mg by mouth 2 (two) times daily.     metoprolol succinate (TOPROL-XL) 50 MG 24 hr tablet TAKE 1 TABLET BY MOUTH ONCE DAILY WITH  OR  IMMEDIATELY  FOLLOWING  A  MEAL 90 tablet 1   Multiple Vitamin (MULTIVITAMIN) tablet Take 1 tablet by mouth daily.     ondansetron (ZOFRAN) 8 MG tablet Take 1 tablet (8 mg total) by mouth every 8 (eight) hours as needed for nausea or vomiting. Start on the third day after cisplatin. 30 tablet 1   prochlorperazine (COMPAZINE) 10 MG tablet Take 1 tablet (10 mg total) by mouth every 6 (six) hours as needed (Nausea or vomiting). 30 tablet 1   No current facility-administered medications for this encounter.    ECOG PERFORMANCE STATUS:  1 - Symptomatic but completely ambulatory  REVIEW OF SYSTEMS: Patient denies any weight loss, fatigue, weakness, fever, chills or night sweats. Patient denies any loss of vision, blurred vision. Patient denies any ringing  of the ears or hearing loss. No irregular heartbeat. Patient denies heart murmur or history of fainting. Patient denies any chest pain or pain radiating to her upper extremities. Patient denies any shortness of breath, difficulty breathing at night, cough or hemoptysis. Patient denies any swelling in the lower legs. Patient denies any nausea vomiting, vomiting of blood, or coffee ground material in the vomitus. Patient denies any stomach pain. Patient states has had normal bowel movements no significant constipation or diarrhea. Patient denies any dysuria, hematuria or significant nocturia. Patient denies any problems walking, swelling in the joints or loss of balance. Patient denies any skin changes, loss of hair or loss of weight. Patient denies any excessive worrying or anxiety or significant depression. Patient denies any problems with insomnia. Patient denies excessive thirst, polyuria, polydipsia. Patient denies any swollen glands, patient denies easy bruising or easy bleeding. Patient denies any  recent infections, allergies or URI. Patient "s visual fields have not changed significantly in recent time.   PHYSICAL EXAM: BP 104/68   Pulse 96   Temp (!) 95.7 F (35.4 C)   Resp 15   Ht 6\' 1"  (1.854 m)   BMI 21.11 kg/m  Patient had a partial glossectomy tongue appears to be healing well.  Neck is clear without evidence of cervical or supraclavicular adenopathy.  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  Well-developed well-nourished patient in NAD. HEENT reveals PERLA, EOMI, discs not visualized.  Oral cavity is clear. No oral mucosal lesions are identified. Neck is clear without evidence of cervical or supraclavicular adenopathy. Lungs are clear to A&P. Cardiac examination is essentially unremarkable with regular rate and rhythm without murmur rub or thrill. Abdomen is benign with no organomegaly or masses noted. Motor sensory and DTR levels are equal and symmetric in the upper and lower extremities. Cranial nerves II through XII are grossly intact. Proprioception is intact. No peripheral adenopathy or edema is identified. No motor or sensory levels are noted. Crude visual  fields are within normal range.  LABORATORY DATA: Pathology reports reviewed    RADIOLOGY RESULTS: PET CT scan reviewed compatible with above-stated findings   IMPRESSION: Stage IVb poorly differentiated squamous cell carcinoma the oral tongue status post resection in 67 year old male  PLAN: At this time I agree with concurrent chemoradiation therapy.  I would treat area of hypermetabolic activity in the right neck to 70 Gray over 7 weeks treating him the rest of the partial tongue and areas of regional lymph node involvement to 58 Gray using IMRT treatment planning and delivery.  Risks and benefits of treatment including loss of taste fatigue skin reaction dysphagia oral mucositis loss of hair xerostomia all were explained in detail to the patient.  I have personally set up and ordered CT simulation for early next  week.  We will coordinate his chemotherapy with medical oncology.  There will be extra effort by both professional staff as well as technical staff to coordinate and manage concurrent chemoradiation and ensuing side effects during his treatments.   I would like to take this opportunity to thank you for allowing me to participate in the care of your patient.Carmina Miller, MD

## 2023-04-13 ENCOUNTER — Encounter: Payer: Self-pay | Admitting: Oncology

## 2023-04-13 ENCOUNTER — Other Ambulatory Visit: Payer: Self-pay | Admitting: Student

## 2023-04-13 DIAGNOSIS — Z01812 Encounter for preprocedural laboratory examination: Secondary | ICD-10-CM

## 2023-04-13 DIAGNOSIS — R1312 Dysphagia, oropharyngeal phase: Secondary | ICD-10-CM | POA: Insufficient documentation

## 2023-04-13 NOTE — Progress Notes (Signed)
Patient for IR Port Insertion on Thurs 04/14/2023, I called and spoke with the patient on the phone and gave pre-procedure instructions. Pt was made aware to be here at 9a, NPO after MN prior to procedure as well as driver post procedure/recovery/discharge. Pt stated understanding.  Called 04/13/2023

## 2023-04-14 ENCOUNTER — Other Ambulatory Visit: Payer: Self-pay | Admitting: *Deleted

## 2023-04-14 ENCOUNTER — Ambulatory Visit
Admission: RE | Admit: 2023-04-14 | Discharge: 2023-04-14 | Disposition: A | Discharge status: Home / Self Care | Payer: 59 | Source: Ambulatory Visit | Attending: Oncology | Admitting: Oncology

## 2023-04-14 ENCOUNTER — Encounter: Payer: Self-pay | Admitting: Radiology

## 2023-04-14 ENCOUNTER — Other Ambulatory Visit: Payer: Self-pay | Admitting: Oncology

## 2023-04-14 ENCOUNTER — Other Ambulatory Visit: Payer: Self-pay

## 2023-04-14 DIAGNOSIS — C029 Malignant neoplasm of tongue, unspecified: Secondary | ICD-10-CM

## 2023-04-14 DIAGNOSIS — E785 Hyperlipidemia, unspecified: Secondary | ICD-10-CM | POA: Insufficient documentation

## 2023-04-14 DIAGNOSIS — E119 Type 2 diabetes mellitus without complications: Secondary | ICD-10-CM | POA: Diagnosis not present

## 2023-04-14 DIAGNOSIS — Z01812 Encounter for preprocedural laboratory examination: Secondary | ICD-10-CM

## 2023-04-14 DIAGNOSIS — F17210 Nicotine dependence, cigarettes, uncomplicated: Secondary | ICD-10-CM | POA: Diagnosis not present

## 2023-04-14 DIAGNOSIS — I10 Essential (primary) hypertension: Secondary | ICD-10-CM | POA: Diagnosis not present

## 2023-04-14 HISTORY — PX: IR IMAGING GUIDED PORT INSERTION: IMG5740

## 2023-04-14 LAB — GLUCOSE, CAPILLARY: Glucose-Capillary: 232 mg/dL — ABNORMAL HIGH (ref 70–99)

## 2023-04-14 MED ORDER — MIDAZOLAM HCL 5 MG/5ML IJ SOLN
INTRAMUSCULAR | Status: AC | PRN
Start: 2023-04-14 — End: 2023-04-14
  Administered 2023-04-14: .5 mg via INTRAVENOUS
  Administered 2023-04-14: 1 mg via INTRAVENOUS

## 2023-04-14 MED ORDER — SODIUM CHLORIDE 0.9 % IV SOLN: INTRAVENOUS | Status: DC

## 2023-04-14 MED ORDER — LIDOCAINE HCL 1 % IJ SOLN
20.0000 mL | Freq: Once | INTRAMUSCULAR | Status: AC
  Administered 2023-04-14: 15 mL via INTRADERMAL

## 2023-04-14 MED ORDER — MIDAZOLAM HCL 2 MG/2ML IJ SOLN
INTRAMUSCULAR | Status: AC
  Filled 2023-04-14: qty 2

## 2023-04-14 MED ORDER — LIDOCAINE HCL 1 % IJ SOLN
INTRAMUSCULAR | Status: AC
  Filled 2023-04-14: qty 20

## 2023-04-14 MED ORDER — HEPARIN SOD (PORK) LOCK FLUSH 100 UNIT/ML IV SOLN
INTRAVENOUS | Status: AC
  Filled 2023-04-14: qty 5

## 2023-04-14 MED ORDER — FENTANYL CITRATE (PF) 100 MCG/2ML IJ SOLN
INTRAMUSCULAR | Status: AC
  Filled 2023-04-14: qty 2

## 2023-04-14 MED ORDER — HEPARIN SOD (PORK) LOCK FLUSH 100 UNIT/ML IV SOLN
500.0000 [IU] | Freq: Once | INTRAVENOUS | Status: AC
  Administered 2023-04-14: 500 [IU] via INTRAVENOUS

## 2023-04-14 MED ORDER — FENTANYL CITRATE (PF) 100 MCG/2ML IJ SOLN
INTRAMUSCULAR | Status: AC | PRN
Start: 2023-04-14 — End: 2023-04-14
  Administered 2023-04-14: 50 ug via INTRAVENOUS
  Administered 2023-04-14: 25 ug via INTRAVENOUS

## 2023-04-14 NOTE — Procedures (Signed)
Interventional Radiology Procedure Note  Procedure: Single Lumen Power Port Placement    Access:  Right IJ vein.  Findings: Catheter tip positioned at SVC/RA junction. Port is ready for immediate use.   Complications: None  EBL: < 10 mL  Recommendations:  - Ok to shower in 24 hours - Do not submerge for 7 days - Routine line care   Lashauna Arpin T. Jacobs Golab, M.D Pager:  [phone]   

## 2023-04-14 NOTE — Progress Notes (Signed)
Patient clinically stable post Port placement per DR Fredia Sorrow, tolerated well. Vitals stable pre and post procedure. Received Versed 1 mg along with Fentanyl 75 mcg IV for procedure. Report given to Alger Simons RN post procedure/specials/17.

## 2023-04-15 ENCOUNTER — Other Ambulatory Visit: Payer: Self-pay

## 2023-04-15 ENCOUNTER — Encounter: Payer: Self-pay | Admitting: Oncology

## 2023-04-18 ENCOUNTER — Inpatient Hospital Stay: Payer: 59

## 2023-04-18 ENCOUNTER — Inpatient Hospital Stay: Payer: 59 | Admitting: Oncology

## 2023-04-18 NOTE — Progress Notes (Signed)
Nutrition Follow-up:  Patient with SCC of oral cavity/oral tongue, stage IV.  S/p glossectomy with flap reconstruction at [facility]  NG tube in place and trach.    Spoke with patient via phone for nutrition follow-up.  Patient says that he is doing 4-6 feedings a day.  He uses 1 carton of osmolite 1.5 and boost (250 calorie) at a feeding.  Flushes with of water before and after each feeding.  Tolerating feeding well.  Having a bowel movement daily.   Noted seen by SLP at [facility] and recommended runny puree foods and thin liquids.  He says that he is eating yogurt, puree brunswick stew and vegetable soup.  Drinking juices, V-8, and other liquids orally.     Medications: reviewed  Labs: BUN 34, creatinine 1.31  Anthropometrics:   Weight 153 lb 14.4 oz on 9/26 219 lb on March 2023   Estimated Energy Needs  Kcals: 2200-2500 Protein: 100-125 g  Fluid: > 2.2 L  NUTRITION DIAGNOSIS: Unintentional weight loss continues    INTERVENTION:  Continue osmolite 1.5, 6 cartons per day. Also adding boost shake in as well for additional calories.  Flush 60ml before and after with drinking other liquids by mouth.  Maybe able to start reducing tube feeding as patient able to eat more orally. Message sent to radiation, RNs to call patient as he has other appointments at [facility] tomorrow (10/1) and scheduled for CT simulation.   Contact information provided     MONITORING, EVALUATION, GOAL: weight trend, intake, tube feeding   NEXT VISIT: Wednesday, Oct 9 during infusion ([place])  Stirling B. Freida Busman, RD, LDN Registered Dietitian [phone]

## 2023-04-19 ENCOUNTER — Ambulatory Visit: Payer: 59

## 2023-04-19 ENCOUNTER — Ambulatory Visit: Admission: RE | Admit: 2023-04-19 | Payer: 59 | Source: Ambulatory Visit

## 2023-04-21 ENCOUNTER — Ambulatory Visit
Admission: RE | Admit: 2023-04-21 | Discharge: 2023-04-21 | Disposition: A | Discharge status: Home / Self Care | Payer: 59 | Source: Ambulatory Visit | Attending: Radiation Oncology | Admitting: Radiation Oncology

## 2023-04-21 ENCOUNTER — Encounter: Payer: Self-pay | Admitting: Oncology

## 2023-04-21 DIAGNOSIS — Z87442 Personal history of urinary calculi: Secondary | ICD-10-CM | POA: Insufficient documentation

## 2023-04-21 DIAGNOSIS — Z7984 Long term (current) use of oral hypoglycemic drugs: Secondary | ICD-10-CM | POA: Insufficient documentation

## 2023-04-21 DIAGNOSIS — C023 Malignant neoplasm of anterior two-thirds of tongue, part unspecified: Secondary | ICD-10-CM

## 2023-04-21 DIAGNOSIS — Z79899 Other long term (current) drug therapy: Secondary | ICD-10-CM | POA: Insufficient documentation

## 2023-04-21 DIAGNOSIS — I10 Essential (primary) hypertension: Secondary | ICD-10-CM | POA: Insufficient documentation

## 2023-04-21 DIAGNOSIS — C770 Secondary and unspecified malignant neoplasm of lymph nodes of head, face and neck: Secondary | ICD-10-CM | POA: Diagnosis not present

## 2023-04-21 DIAGNOSIS — Z87891 Personal history of nicotine dependence: Secondary | ICD-10-CM | POA: Insufficient documentation

## 2023-04-21 DIAGNOSIS — Z7952 Long term (current) use of systemic steroids: Secondary | ICD-10-CM | POA: Insufficient documentation

## 2023-04-21 DIAGNOSIS — Z5111 Encounter for antineoplastic chemotherapy: Secondary | ICD-10-CM | POA: Diagnosis present

## 2023-04-21 DIAGNOSIS — E785 Hyperlipidemia, unspecified: Secondary | ICD-10-CM | POA: Insufficient documentation

## 2023-04-21 DIAGNOSIS — F17210 Nicotine dependence, cigarettes, uncomplicated: Secondary | ICD-10-CM | POA: Diagnosis not present

## 2023-04-21 DIAGNOSIS — E119 Type 2 diabetes mellitus without complications: Secondary | ICD-10-CM | POA: Insufficient documentation

## 2023-04-21 DIAGNOSIS — Z7982 Long term (current) use of aspirin: Secondary | ICD-10-CM | POA: Insufficient documentation

## 2023-04-21 DIAGNOSIS — C021 Malignant neoplasm of border of tongue: Secondary | ICD-10-CM | POA: Diagnosis not present

## 2023-04-25 ENCOUNTER — Telehealth: Payer: Self-pay | Admitting: *Deleted

## 2023-04-25 ENCOUNTER — Encounter: Payer: Self-pay | Admitting: Oncology

## 2023-04-25 NOTE — Telephone Encounter (Signed)
If this is a screening colonoscopy it can be postponed.  But if the colonoscopy was scheduled for a specific reason he can get it out of the way before he starts chemoradiation. I am ok either ways

## 2023-04-25 NOTE — Telephone Encounter (Signed)
Patient will go ahead and have procedure toomorrow

## 2023-04-25 NOTE — Telephone Encounter (Signed)
Patient called and is asking if he should postpone his colonoscopy scheduled for tomorrow until after he completed his chemotherapy treatments. Please advise

## 2023-04-26 ENCOUNTER — Ambulatory Visit: Payer: 59 | Admitting: Certified Registered"

## 2023-04-26 ENCOUNTER — Encounter
Admission: RE | Disposition: A | Discharge status: Home / Self Care | Payer: Self-pay | Source: Home / Self Care | Attending: Gastroenterology

## 2023-04-26 ENCOUNTER — Encounter: Payer: Self-pay | Admitting: Gastroenterology

## 2023-04-26 ENCOUNTER — Ambulatory Visit
Admission: RE | Admit: 2023-04-26 | Discharge: 2023-04-26 | Disposition: A | Discharge status: Home / Self Care | Payer: 59 | Attending: Gastroenterology | Admitting: Gastroenterology

## 2023-04-26 DIAGNOSIS — F17210 Nicotine dependence, cigarettes, uncomplicated: Secondary | ICD-10-CM | POA: Diagnosis not present

## 2023-04-26 DIAGNOSIS — J449 Chronic obstructive pulmonary disease, unspecified: Secondary | ICD-10-CM | POA: Diagnosis not present

## 2023-04-26 DIAGNOSIS — E119 Type 2 diabetes mellitus without complications: Secondary | ICD-10-CM | POA: Insufficient documentation

## 2023-04-26 DIAGNOSIS — Z85810 Personal history of malignant neoplasm of tongue: Secondary | ICD-10-CM | POA: Insufficient documentation

## 2023-04-26 DIAGNOSIS — Z1211 Encounter for screening for malignant neoplasm of colon: Secondary | ICD-10-CM | POA: Diagnosis present

## 2023-04-26 DIAGNOSIS — Z5111 Encounter for antineoplastic chemotherapy: Secondary | ICD-10-CM | POA: Diagnosis not present

## 2023-04-26 DIAGNOSIS — I10 Essential (primary) hypertension: Secondary | ICD-10-CM | POA: Insufficient documentation

## 2023-04-26 DIAGNOSIS — Z7984 Long term (current) use of oral hypoglycemic drugs: Secondary | ICD-10-CM | POA: Insufficient documentation

## 2023-04-26 DIAGNOSIS — Z930 Tracheostomy status: Secondary | ICD-10-CM | POA: Insufficient documentation

## 2023-04-26 HISTORY — DX: Malignant (primary) neoplasm, unspecified: C80.1

## 2023-04-26 HISTORY — DX: Abnormal weight loss: R63.4

## 2023-04-26 HISTORY — PX: COLONOSCOPY WITH PROPOFOL: SHX5780

## 2023-04-26 HISTORY — DX: Malignant neoplasm of border of tongue: C02.1

## 2023-04-26 LAB — GLUCOSE, CAPILLARY: Glucose-Capillary: 219 mg/dL — ABNORMAL HIGH (ref 70–99)

## 2023-04-26 SURGERY — COLONOSCOPY WITH PROPOFOL
Anesthesia: General

## 2023-04-26 MED ORDER — SODIUM CHLORIDE 0.9 % IV SOLN: INTRAVENOUS | Status: DC

## 2023-04-26 MED ORDER — PROPOFOL 500 MG/50ML IV EMUL
INTRAVENOUS | Status: DC | PRN
  Administered 2023-04-26: 125 ug/kg/min via INTRAVENOUS

## 2023-04-26 MED ORDER — PROPOFOL 10 MG/ML IV BOLUS
INTRAVENOUS | Status: DC | PRN
Start: 2023-04-26 — End: 2023-04-26
  Administered 2023-04-26: 20 mg via INTRAVENOUS

## 2023-04-26 MED ORDER — SODIUM CHLORIDE 0.9 % IV SOLN
INTRAVENOUS | Status: DC | PRN
Start: 2023-04-26 — End: 2023-04-26

## 2023-04-26 MED ORDER — PROPOFOL 1000 MG/100ML IV EMUL
INTRAVENOUS | Status: AC
  Filled 2023-04-26: qty 100

## 2023-04-26 MED FILL — Fosaprepitant Dimeglumine For IV Infusion 150 MG (Base Eq): INTRAVENOUS | Qty: 5 | Status: AC

## 2023-04-26 MED FILL — Dexamethasone Sodium Phosphate Inj 100 MG/10ML: INTRAMUSCULAR | Qty: 1 | Status: AC

## 2023-04-26 NOTE — Anesthesia Procedure Notes (Signed)
Procedure Name: MAC Date/Time: 04/26/2023 9:12 AM  Performed by: Nelle Don, CRNAPre-anesthesia Checklist: Patient identified, Emergency Drugs available, Suction available and Patient being monitored Oxygen Delivery Method: Simple face mask

## 2023-04-26 NOTE — Op Note (Signed)
[facility] Gastroenterology Patient Name: Shane Perry Procedure Date: 04/26/2023 9:10 AM MRN: [id number] Account #: [id number] Date of Birth: Nov 15, 1955 Admit Type: Outpatient Age: 67 Room: [facility] ENDO ROOM 4 Gender: Male Note Status: Finalized Instrument Name: Prentice Docker [id number] Procedure:             Colonoscopy Indications:           Screening for colorectal malignant neoplasm Providers:             [name] MD, MD Referring MD:          No Local Md, MD (Referring MD) Medicines:             Propofol per Anesthesia Complications:         No immediate complications. Procedure:             Pre-Anesthesia Assessment:                        - Prior to the procedure, a History and Physical was                         performed, and patient medications and allergies were                         reviewed. The patient's tolerance of previous                         anesthesia was also reviewed. The risks and benefits                         of the procedure and the sedation options and risks                         were discussed with the patient. All questions were                         answered, and informed consent was obtained. Prior                         Anticoagulants: The patient has taken no anticoagulant                         or antiplatelet agents. ASA Grade Assessment: III - A                         patient with severe systemic disease. After reviewing                         the risks and benefits, the patient was deemed in                         satisfactory condition to undergo the procedure.                        After obtaining informed consent, the colonoscope was                         passed under direct vision. Throughout the procedure,  the patient's blood pressure, pulse, and oxygen                         saturations were monitored continuously. The                         Colonoscope was introduced through  the anus with the                         intention of advancing to the cecum. The scope was                         advanced to the sigmoid colon before the procedure was                         aborted. Medications were given. The colonoscopy was                         performed without difficulty. The patient tolerated                         the procedure well. The quality of the bowel                         preparation was not adequate to identify polyps                         greater than 5 mm in size. Findings:      The perianal and digital rectal examinations were normal.      A large amount of stool was found in the rectum, in the recto-sigmoid       colon and in the sigmoid colon, precluding visualization. Impression:            - Preparation of the colon was inadequate.                        - Stool in the rectum, in the recto-sigmoid colon and                         in the sigmoid colon.                        - No specimens collected. Recommendation:        - Discharge patient to home.                        - Resume previous diet.                        - Continue present medications.                        - Repeat colonoscopy because the bowel preparation was                         poor. Procedure Code(s):     --- Professional ---                        [phone], 53, Colonoscopy,  flexible; diagnostic,                         including collection of specimen(s) by brushing or                         washing, when performed (separate procedure) Diagnosis Code(s):     --- Professional ---                        Z12.11, Encounter for screening for malignant neoplasm                         of colon CPT copyright 2022 American Medical Association. All rights reserved. The codes documented in this report are preliminary and upon coder review may  be revised to meet current compliance requirements. [name] MD, MD 04/26/2023 9:20:36 AM This report has been signed  electronically. Number of Addenda: 0 Note Initiated On: 04/26/2023 9:10 AM Total Procedure Duration: 0 hours 2 minutes 5 seconds  Estimated Blood Loss:  Estimated blood loss: none.      [facility]

## 2023-04-26 NOTE — Anesthesia Postprocedure Evaluation (Signed)
Anesthesia Post Note  Patient: Shane Perry  Procedure(s) Performed: COLONOSCOPY WITH PROPOFOL  Patient location during evaluation: PACU Anesthesia Type: General Level of consciousness: awake and awake and alert Pain management: satisfactory to patient Vital Signs Assessment: post-procedure vital signs reviewed and stable Cardiovascular status: stable Anesthetic complications: no   No notable events documented.   Last Vitals:  Vitals:   04/26/23 0932 04/26/23 0942  BP: 113/67 124/86  Pulse:    Resp:    Temp:    SpO2:      Last Pain:  Vitals:   04/26/23 0942  TempSrc:   PainSc: 0-No pain                 VAN STAVEREN,Takia Runyon

## 2023-04-26 NOTE — Transfer of Care (Signed)
Immediate Anesthesia Transfer of Care Note  Patient: Shane Perry  Procedure(s) Performed: COLONOSCOPY WITH PROPOFOL  Patient Location: PACU  Anesthesia Type:General  Level of Consciousness: drowsy  Airway & Oxygen Therapy: Patient Spontanous Breathing and Patient connected to face mask oxygen  Post-op Assessment: Report given to RN, Post -op Vital signs reviewed and stable, and Patient moving all extremities X 4  Post vital signs: Reviewed and stable  Last Vitals:  Vitals Value Taken Time  BP 122/53 04/26/23 0922  Temp    Pulse 71 04/26/23 0923  Resp 9 04/26/23 0923  SpO2 100 % 04/26/23 0923  Vitals shown include unfiled device data.  Last Pain:  Vitals:   04/26/23 0858  TempSrc: Temporal         Complications: No notable events documented.

## 2023-04-26 NOTE — H&P (Signed)
Shane Minium, MD [facility] [address], [address] [place], [place] [address] Phone: [phone] Fax : [phone]  Primary Care Physician:  Arnetha Courser, NP Primary Gastroenterologist:  Dr. Servando Snare  Pre-Procedure History & Physical: HPI:  Shane Perry is a 67 y.o. male is here for a screening colonoscopy.   Past Medical History:  Diagnosis Date   Cancer (HCC)    Diabetes mellitus without complication (HCC)    Hyperlipidemia    Hypertension    Kidney stones    Squamous cell carcinoma, tongue border (HCC)    Weight loss     Past Surgical History:  Procedure Laterality Date   COLONOSCOPY     CYST REMOVAL NECK     IR IMAGING GUIDED PORT INSERTION  04/14/2023   partial tongue removal  03/14/2023   tongue cancer     TRACHEOSTOMY  03/14/2023    Prior to Admission medications   Medication Sig Start Date End Date Taking? Authorizing Provider  acetaminophen (TYLENOL) 500 MG tablet Take 500 mg by mouth every 4 (four) hours as needed.   Yes [provider]  aspirin 81 MG chewable tablet Chew 81 mg by mouth daily.   Yes [provider]  insulin regular (NOVOLIN R) 100 units/mL injection Inject into the skin 3 (three) times daily before meals.   Yes [provider]  losartan (COZAAR) 50 MG tablet Take 50 mg by mouth daily. 08/11/21  Yes [provider]  metFORMIN (GLUCOPHAGE-XR) 500 MG 24 hr tablet Take 1,000 mg by mouth 2 (two) times daily. 03/19/21  Yes [provider]  metoprolol succinate (TOPROL-XL) 50 MG 24 hr tablet TAKE 1 TABLET BY MOUTH ONCE DAILY WITH  OR  IMMEDIATELY  FOLLOWING  A  MEAL 05/13/22  Yes Gollan, Tollie Pizza, MD  Blood Glucose Monitoring Suppl (ONETOUCH VERIO FLEX SYSTEM) w/Device KIT by Does not apply route. 10/14/22 10/14/23  [provider]  dexamethasone (DECADRON) 4 MG tablet Take 2 tablets (8 mg) by mouth daily x 3 days starting the day after cisplatin chemotherapy. Take with food. Patient not taking:  Reported on 04/26/2023 03/29/23   Creig Hines, MD  empagliflozin (JARDIANCE) 10 MG TABS tablet Take 1 tablet by mouth every morning. Patient not taking: Reported on 01/31/2023 01/30/21   [provider]  glipiZIDE (GLUCOTROL XL) 10 MG 24 hr tablet Take 10 mg by mouth 2 (two) times daily. Patient not taking: Reported on 04/26/2023 04/01/21   [provider]  glucose blood test strip Use daily. Pharmacy, please dispense this brand of blood glucose test strips: Other: One Touch Verio Flex 02/10/23 02/10/24  [provider]  lidocaine-prilocaine (EMLA) cream Apply to affected area once 03/29/23   Creig Hines, MD  Multiple Vitamin (MULTIVITAMIN) tablet Take 1 tablet by mouth daily.    [provider]  ondansetron (ZOFRAN) 8 MG tablet Take 1 tablet (8 mg total) by mouth every 8 (eight) hours as needed for nausea or vomiting. Start on the third day after cisplatin. 03/29/23   Creig Hines, MD  prochlorperazine (COMPAZINE) 10 MG tablet Take 1 tablet (10 mg total) by mouth every 6 (six) hours as needed (Nausea or vomiting). 03/29/23   Creig Hines, MD    Allergies as of 01/31/2023 - Review Complete 01/31/2023  Allergen Reaction Noted   Statins Other (See Comments) 01/08/2016   Lisinopril Other (See Comments) 01/08/2016    Family History  Problem Relation Age of Onset   Hypertension Mother  Alzheimer's disease Mother    Alzheimer's disease Father    Heart disease Sister    Diabetes type I Brother     Social History   Socioeconomic History   Marital status: Divorced    Spouse name: Not on file   Number of children: Not on file   Years of education: Not on file   Highest education level: Not on file  Occupational History   Not on file  Tobacco Use   Smoking status: Every Day    Current packs/day: 1.00    Average packs/day: 1 pack/day for 50.0 years (50.0 ttl pk-yrs)    Types: Cigarettes   Smokeless tobacco: Never  Vaping Use   Vaping status: Never  Used  Substance and Sexual Activity   Alcohol use: Yes    Alcohol/week: 1.0 standard drink of alcohol    Types: 1 Cans of beer per week    Comment: Rare   Drug use: Never   Sexual activity: Not on file  Other Topics Concern   Not on file  Social History Narrative   Not on file   Social Determinants of Health   Financial Resource Strain: Patient Declined (02/18/2023)   Received from [facility]   Overall Financial Resource Strain (CARDIA)    Difficulty of Paying Living Expenses: Patient declined  Food Insecurity: Medium Risk (03/21/2023)   Received from [facility]   Hunger Vital Sign    Worried About Running Out of Food in the Last Year: Sometimes true    Ran Out of Food in the Last Year: Never true  Transportation Needs: No Transportation Needs (03/21/2023)   Received from [facility]    In the past 12 months, has lack of reliable transportation kept you from medical appointments, meetings, work or from getting things needed for daily living? : No  Physical Activity: Insufficiently Active (02/18/2023)   Received from [facility]   Exercise Vital Sign    Days of Exercise per Week: 3 days    Minutes of Exercise per Session: 30 min  Stress: No Stress Concern Present (02/18/2023)   Received from [facility] - Occupational Stress Questionnaire    Feeling of Stress : Only a little  Social Connections: Moderately Integrated (02/18/2023)   Received from [facility]   Social Network    How would you rate your social network (family, work, friends)?: Adequate participation with social networks  Intimate Partner Violence: Not At Risk (02/18/2023)   Received from [facility]   HITS    Over the last 12 months how often did your partner physically hurt you?: 1    Over the last 12 months how often did your partner insult you or talk down to you?: 1    Over the last 12 months how often did your partner threaten you with  physical harm?: 1    Over the last 12 months how often did your partner scream or curse at you?: 1    Review of Systems: See HPI, otherwise negative ROS  Physical Exam: There were no vitals taken for this visit. General:   Alert,  pleasant and cooperative in NAD Head:  Normocephalic and atraumatic. Neck:  Supple; no masses or thyromegaly. Lungs:  Clear throughout to auscultation.    Heart:  Regular rate and rhythm. Abdomen:  Soft, nontender and nondistended. Normal bowel sounds, without guarding, and without rebound.   Neurologic:  Alert and  oriented x4;  grossly  normal neurologically.  Impression/Plan: Nirvaan L Perry is now here to undergo a screening colonoscopy.  Risks, benefits, and alternatives regarding colonoscopy have been reviewed with the patient.  Questions have been answered.  All parties agreeable.

## 2023-04-26 NOTE — Anesthesia Preprocedure Evaluation (Signed)
Anesthesia Evaluation  Patient identified by MRN, date of birth, ID band Patient awake    Reviewed: Allergy & Precautions, NPO status , Patient's Chart, lab work & pertinent test results  Airway Mallampati: III  TM Distance: >3 FB Neck ROM: full    Dental  (+) Missing, Poor Dentition, Chipped, Dental Advisory Given   Pulmonary neg pulmonary ROS, COPD, Current Smoker Tracheostomy September 24 after tongue cancer excission   Pulmonary exam normal  + decreased breath sounds      Cardiovascular Exercise Tolerance: Poor hypertension, Pt. on medications negative cardio ROS Normal cardiovascular exam+ dysrhythmias  Rhythm:Irregular Rate:Abnormal     Neuro/Psych   Anxiety     negative neurological ROS  negative psych ROS   GI/Hepatic negative GI ROS, Neg liver ROS,,,  Endo/Other  negative endocrine ROSdiabetes, Type 2, Oral Hypoglycemic Agents    Renal/GU negative Renal ROS  negative genitourinary   Musculoskeletal   Abdominal Normal abdominal exam  (+)   Peds negative pediatric ROS (+)  Hematology negative hematology ROS (+)   Anesthesia Other Findings Past Medical History: No date: Cancer (HCC) No date: Diabetes mellitus without complication (HCC) No date: Hyperlipidemia No date: Hypertension No date: Kidney stones No date: Squamous cell carcinoma, tongue border (HCC) No date: Weight loss  Past Surgical History: No date: COLONOSCOPY No date: CYST REMOVAL NECK 04/14/2023: IR IMAGING GUIDED PORT INSERTION 03/14/2023: partial tongue removal No date: tongue cancer 03/14/2023: TRACHEOSTOMY     Reproductive/Obstetrics negative OB ROS                             Anesthesia Physical Anesthesia Plan  ASA: 3  Anesthesia Plan: General   Post-op Pain Management:    Induction: Intravenous  PONV Risk Score and Plan: Propofol infusion and TIVA  Airway Management Planned: Natural Airway  and Nasal Cannula  Additional Equipment:   Intra-op Plan:   Post-operative Plan:   Informed Consent: I have reviewed the patients History and Physical, chart, labs and discussed the procedure including the risks, benefits and alternatives for the proposed anesthesia with the patient or authorized representative who has indicated his/her understanding and acceptance.     Dental Advisory Given  Plan Discussed with: CRNA and Surgeon  Anesthesia Plan Comments:        Anesthesia Quick Evaluation

## 2023-04-26 NOTE — Brief Op Note (Signed)
Colonoscopy aborted due to poor prep

## 2023-04-27 ENCOUNTER — Encounter: Payer: Self-pay | Admitting: Oncology

## 2023-04-27 ENCOUNTER — Inpatient Hospital Stay (HOSPITAL_BASED_OUTPATIENT_CLINIC_OR_DEPARTMENT_OTHER): Payer: 59 | Admitting: Oncology

## 2023-04-27 ENCOUNTER — Other Ambulatory Visit: Payer: 59

## 2023-04-27 ENCOUNTER — Inpatient Hospital Stay: Payer: 59 | Attending: Oncology

## 2023-04-27 ENCOUNTER — Inpatient Hospital Stay: Payer: 59

## 2023-04-27 ENCOUNTER — Ambulatory Visit: Admission: RE | Admit: 2023-04-27 | Payer: 59 | Source: Ambulatory Visit

## 2023-04-27 VITALS — BP 123/73 | HR 78 | Temp 96.5°F | Resp 18

## 2023-04-27 VITALS — BP 93/60 | HR 90 | Temp 96.3°F | Resp 17 | Ht 73.0 in | Wt 159.9 lb

## 2023-04-27 DIAGNOSIS — Z5111 Encounter for antineoplastic chemotherapy: Secondary | ICD-10-CM

## 2023-04-27 DIAGNOSIS — C029 Malignant neoplasm of tongue, unspecified: Secondary | ICD-10-CM | POA: Diagnosis not present

## 2023-04-27 DIAGNOSIS — C021 Malignant neoplasm of border of tongue: Secondary | ICD-10-CM | POA: Insufficient documentation

## 2023-04-27 DIAGNOSIS — C770 Secondary and unspecified malignant neoplasm of lymph nodes of head, face and neck: Secondary | ICD-10-CM | POA: Insufficient documentation

## 2023-04-27 DIAGNOSIS — F17210 Nicotine dependence, cigarettes, uncomplicated: Secondary | ICD-10-CM | POA: Insufficient documentation

## 2023-04-27 LAB — MAGNESIUM: Magnesium: 2 mg/dL (ref 1.7–2.4)

## 2023-04-27 LAB — BASIC METABOLIC PANEL - CANCER CENTER ONLY
Anion gap: 10 (ref 5–15)
BUN: 17 mg/dL (ref 8–23)
CO2: 30 mmol/L (ref 22–32)
Calcium: 9 mg/dL (ref 8.9–10.3)
Chloride: 93 mmol/L — ABNORMAL LOW (ref 98–111)
Creatinine: 0.62 mg/dL (ref 0.61–1.24)
GFR, Estimated: >60 mL/min (ref >60)
Glucose, Bld: 369 mg/dL — ABNORMAL HIGH (ref 70–99)
Potassium: 3.7 mmol/L (ref 3.5–5.1)
Sodium: 133 mmol/L — ABNORMAL LOW (ref 135–145)

## 2023-04-27 LAB — CBC WITH DIFFERENTIAL (CANCER CENTER ONLY)
Abs Immature Granulocytes: 0.06 10*3/uL (ref 0.00–0.07)
Basophils Absolute: 0 10*3/uL (ref 0.0–0.1)
Basophils Relative: 0 %
Eosinophils Absolute: 0.1 10*3/uL (ref 0.0–0.5)
Eosinophils Relative: 1 %
HCT: 40 % (ref 39.0–52.0)
Hemoglobin: 13.6 g/dL (ref 13.0–17.0)
Immature Granulocytes: 1 %
Lymphocytes Relative: 8 %
Lymphs Abs: 1 10*3/uL (ref 0.7–4.0)
MCH: 31.9 pg (ref 26.0–34.0)
MCHC: 34 g/dL (ref 30.0–36.0)
MCV: 93.7 fL (ref 80.0–100.0)
Monocytes Absolute: 0.6 10*3/uL (ref 0.1–1.0)
Monocytes Relative: 5 %
Neutro Abs: 10.6 10*3/uL — ABNORMAL HIGH (ref 1.7–7.7)
Neutrophils Relative %: 85 %
Platelet Count: 230 10*3/uL (ref 150–400)
RBC: 4.27 MIL/uL (ref 4.22–5.81)
RDW: 12.3 % (ref 11.5–15.5)
WBC Count: 12.4 10*3/uL — ABNORMAL HIGH (ref 4.0–10.5)
nRBC: 0 % (ref 0.0–0.2)

## 2023-04-27 MED ORDER — HEPARIN SOD (PORK) LOCK FLUSH 100 UNIT/ML IV SOLN
500.0000 [IU] | Freq: Once | INTRAVENOUS | Status: AC | PRN
  Administered 2023-04-27: 500 [IU]
  Filled 2023-04-27: qty 5

## 2023-04-27 MED ORDER — PALONOSETRON HCL INJECTION 0.25 MG/5ML
0.2500 mg | Freq: Once | INTRAVENOUS | Status: AC
  Administered 2023-04-27: 0.25 mg via INTRAVENOUS
  Filled 2023-04-27: qty 5

## 2023-04-27 MED ORDER — SODIUM CHLORIDE 0.9 % IV SOLN
150.0000 mg | Freq: Once | INTRAVENOUS | Status: AC
  Administered 2023-04-27: 150 mg via INTRAVENOUS
  Filled 2023-04-27: qty 150

## 2023-04-27 MED ORDER — SODIUM CHLORIDE 0.9 % IV SOLN
10.0000 mg | Freq: Once | INTRAVENOUS | Status: AC
  Administered 2023-04-27: 10 mg via INTRAVENOUS
  Filled 2023-04-27: qty 10

## 2023-04-27 MED ORDER — SODIUM CHLORIDE 0.9 % IV SOLN
40.0000 mg/m2 | Freq: Once | INTRAVENOUS | Status: AC
  Administered 2023-04-27: 77 mg via INTRAVENOUS
  Filled 2023-04-27: qty 77

## 2023-04-27 MED ORDER — MAGNESIUM SULFATE 2 GM/50ML IV SOLN
2.0000 g | Freq: Once | INTRAVENOUS | Status: AC
  Administered 2023-04-27: 2 g via INTRAVENOUS

## 2023-04-27 MED ORDER — SODIUM CHLORIDE 0.9 % IV SOLN
Freq: Once | INTRAVENOUS | Status: AC
  Filled 2023-04-27: qty 250

## 2023-04-27 MED ORDER — POTASSIUM CHLORIDE IN NACL 20-0.9 MEQ/L-% IV SOLN
Freq: Once | INTRAVENOUS | Status: AC
  Filled 2023-04-27: qty 1000

## 2023-04-27 NOTE — Progress Notes (Signed)
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  He was subsequently seen by Dr. Lou Cal from ENT surgery at [facility] and underwent CT soft tissue neck as well as CT chest.  CT soft tissue neck showed ulcerated lesion involving the anterior lateral tongue measuring 1 x 3.4 cm in the axial plane.  1.9 cm in the coronal plane.  Relative sparing of the tongue base and floor of mouth.  Parapharyngeal and retropharyngeal spaces appear intact.  Small subcentimeter lymph nodes within bilateral neck which were nonspecific.  CT chest showed subcentimeter lung nodules that were nonspecific.  He had a biopsy of this tongue mass which was consistent with squamous cell carcinoma moderately differentiated.    He was seen by [facility] and underwent glossectomy and modified neck dissection on 03/14/2023 with tracheostomy and NG tube placement. Final Diagnosis        A. 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   B. LEFT SUBMANDIBULAR CONTENTS, DISSECTION:               Benign submandibular gland; negative for carcinoma.              Three benign lymph nodes, negative for metastatic carcinoma (0/3).                C. SUBMENTAL CONTENTS, DISSECTION:               Four benign lymph nodes, negative for metastatic carcinoma (0/4).   D. RIGHT SUBMANDIBULAR CONTENTS, EXCISION:               Benign submandibular tissue.              Negative for carcinoma.   E. PAROTID GLAND, LEFT TAIL, EXCISION:               [place] tumor (1.1 cm); the tumor appears completely excised.              Negative for carcinoma.              Four benign lymph nodes, negative for metastatic carcinoma (0/4).   F. LEFT NECK CONTENTS LEVELS 2A, 3, 4 AND 5, DISSECTION:               Eight out of thirty-one lymph nodes, positive for metastatic squamous cell carcinoma (8/31).                           Size of largest nodal metastatic deposit: 3.3 cm. Extranodal extension: Identified. Benign salivary gland tissue; negative for malignancy.               G. LEFT NECK CONTENTS LEVELS 2B, DISSECTION:  Nine benign lymph nodes, negative for metastatic carcinoma (0/9).    H. NODULE AT LEFT CAROTID BULB, EXCISION:               One benign lymph node, positive for metastatic squamous cell carcinoma (1/1). Size of metastatic deposit: 0.4 cm. Extranodal extension: Not identified.   I. TONGUE, LEFT, GLOSSECTOMY  :               Invasive squamous cell carcinoma, keratinizing, poorly differentiated with tumor necrosis.                           Tumor size: 5.0 cm in greatest dimension.                           Depth of invasion: 12 mm                           Lymphovascular and perineural invasion: Identified.                           Margins: Negative                           Pathologic stage: pT4a, pN3b.                           Please see CAP synoptic report.               J. TEETH 1,2,14,15, and 19, EXTRACTION:               Teeth (gross only)  Plan is for adjuvant concurrent chemoradiation with weekly Cisplatin.  PET/CT scan in September 2024 showed hypermetabolic right cervical right supraclavicular  adenopathy but no other evidence of distant metastatic disease.  Interval history-patient has an NG tube in place which he uses to supplement his feeding but is also able to drink boost orally.  Pain is currently well-controlled  ECOG PS- 1 Pain scale- 0   Review of systems- Review of Systems  Constitutional:  Negative for chills, fever, malaise/fatigue and weight loss.  HENT:  Negative for congestion, ear discharge and nosebleeds.   Eyes:  Negative for blurred vision.  Respiratory:  Negative for cough, hemoptysis, sputum production, shortness of breath and wheezing.   Cardiovascular:  Negative for chest pain, palpitations, orthopnea and claudication.  Gastrointestinal:  Negative for abdominal pain, blood in stool, constipation, diarrhea, heartburn, melena, nausea and vomiting.  Genitourinary:  Negative for dysuria, flank pain, frequency, hematuria and urgency.  Musculoskeletal:  Negative for back pain, joint pain and myalgias.  Skin:  Negative for rash.  Neurological:  Negative for dizziness, tingling, focal weakness, seizures, weakness and headaches.  Endo/Heme/Allergies:  Does not bruise/bleed easily.  Psychiatric/Behavioral:  Negative for depression and suicidal ideas. The patient does not have insomnia.       Allergies  Allergen Reactions   Statins Other (See Comments)    Loss of balance   Lisinopril Other (See Comments)    hoarseness     Past Medical History:  Diagnosis Date   Cancer (HCC)    Diabetes mellitus without complication (HCC)    Hyperlipidemia    Hypertension    Kidney stones    Squamous cell carcinoma, tongue border (HCC)    Weight loss  Past Surgical History:  Procedure Laterality Date   COLONOSCOPY     CYST REMOVAL NECK     IR IMAGING GUIDED PORT INSERTION  04/14/2023   left arm muscle removal for tongue     partial tongue removal  03/14/2023   tongue cancer     TRACHEOSTOMY  03/14/2023    Social History   Socioeconomic History    Marital status: Divorced    Spouse name: Not on file   Number of children: Not on file   Years of education: Not on file   Highest education level: Not on file  Occupational History   Not on file  Tobacco Use   Smoking status: Every Day    Current packs/day: 1.00    Average packs/day: 1 pack/day for 50.0 years (50.0 ttl pk-yrs)    Types: Cigarettes   Smokeless tobacco: Never  Vaping Use   Vaping status: Never Used  Substance and Sexual Activity   Alcohol use: Yes    Alcohol/week: 1.0 standard drink of alcohol    Types: 1 Cans of beer per week    Comment: Rare   Drug use: Never   Sexual activity: Not on file  Other Topics Concern   Not on file  Social History Narrative   Not on file   Social Determinants of Health   Financial Resource Strain: Patient Declined (02/18/2023)   Received from [facility]   Overall Financial Resource Strain (CARDIA)    Difficulty of Paying Living Expenses: Patient declined  Food Insecurity: Medium Risk (03/21/2023)   Received from [facility]   Hunger Vital Sign    Worried About Running Out of Food in the Last Year: Sometimes true    Ran Out of Food in the Last Year: Never true  Transportation Needs: No Transportation Needs (03/21/2023)   Received from [facility]    In the past 12 months, has lack of reliable transportation kept you from medical appointments, meetings, work or from getting things needed for daily living? : No  Physical Activity: Insufficiently Active (02/18/2023)   Received from [facility]   Exercise Vital Sign    Days of Exercise per Week: 3 days    Minutes of Exercise per Session: 30 min  Stress: No Stress Concern Present (02/18/2023)   Received from [facility] - Occupational Stress Questionnaire    Feeling of Stress : Only a little  Social Connections: Moderately Integrated (02/18/2023)   Received from [facility]   Social Network    How would you rate  your social network (family, work, friends)?: Adequate participation with social networks  Intimate Partner Violence: Not At Risk (02/18/2023)   Received from [facility]   HITS    Over the last 12 months how often did your partner physically hurt you?: 1    Over the last 12 months how often did your partner insult you or talk down to you?: 1    Over the last 12 months how often did your partner threaten you with physical harm?: 1    Over the last 12 months how often did your partner scream or curse at you?: 1    Family History  Problem Relation Age of Onset   Hypertension Mother    Alzheimer's disease Mother    Alzheimer's disease Father    Heart disease Sister    Diabetes type I Brother      Current Outpatient Medications:  aspirin 81 MG chewable tablet, Chew 81 mg by mouth daily., Disp: , Rfl:    Blood Glucose Monitoring Suppl (ONETOUCH VERIO FLEX SYSTEM) w/Device KIT, by Does not apply route., Disp: , Rfl:    insulin regular (NOVOLIN R) 100 units/mL injection, Inject into the skin 3 (three) times daily before meals., Disp: , Rfl:    lidocaine-prilocaine (EMLA) cream, Apply to affected area once, Disp: 30 g, Rfl: 3   acetaminophen (TYLENOL) 500 MG tablet, Take 500 mg by mouth every 4 (four) hours as needed. (Patient not taking: Reported on 04/27/2023), Disp: , Rfl:    dexamethasone (DECADRON) 4 MG tablet, Take 2 tablets (8 mg) by mouth daily x 3 days starting the day after cisplatin chemotherapy. Take with food. (Patient not taking: Reported on 04/26/2023), Disp: 30 tablet, Rfl: 1   empagliflozin (JARDIANCE) 10 MG TABS tablet, Take 1 tablet by mouth every morning. (Patient not taking: Reported on 01/31/2023), Disp: , Rfl:    glipiZIDE (GLUCOTROL XL) 10 MG 24 hr tablet, Take 10 mg by mouth 2 (two) times daily. (Patient not taking: Reported on 04/26/2023), Disp: , Rfl:    glucose blood test strip, Use daily. Pharmacy, please dispense this brand of blood glucose test strips: Other: One  Touch Verio Flex (Patient not taking: Reported on 04/27/2023), Disp: , Rfl:    losartan (COZAAR) 50 MG tablet, Take 50 mg by mouth daily. (Patient not taking: Reported on 04/27/2023), Disp: , Rfl:    metFORMIN (GLUCOPHAGE-XR) 500 MG 24 hr tablet, Take 1,000 mg by mouth 2 (two) times daily. (Patient not taking: Reported on 04/27/2023), Disp: , Rfl:    metoprolol succinate (TOPROL-XL) 50 MG 24 hr tablet, TAKE 1 TABLET BY MOUTH ONCE DAILY WITH  OR  IMMEDIATELY  FOLLOWING  A  MEAL (Patient not taking: Reported on 04/27/2023), Disp: 90 tablet, Rfl: 1   Multiple Vitamin (MULTIVITAMIN) tablet, Take 1 tablet by mouth daily. (Patient not taking: Reported on 04/27/2023), Disp: , Rfl:    ondansetron (ZOFRAN) 8 MG tablet, Take 1 tablet (8 mg total) by mouth every 8 (eight) hours as needed for nausea or vomiting. Start on the third day after cisplatin. (Patient not taking: Reported on 04/27/2023), Disp: 30 tablet, Rfl: 1   prochlorperazine (COMPAZINE) 10 MG tablet, Take 1 tablet (10 mg total) by mouth every 6 (six) hours as needed (Nausea or vomiting). (Patient not taking: Reported on 04/27/2023), Disp: 30 tablet, Rfl: 1 No current facility-administered medications for this visit.  Facility-Administered Medications Ordered in Other Visits:    CISplatin (PLATINOL) 77 mg in sodium chloride 0.9 % 250 mL chemo infusion, 40 mg/m2 (Treatment Plan Recorded), Intravenous, Once, Creig Hines, MD  Physical exam:  Vitals:   04/27/23 0855  BP: 93/60  Pulse: 90  Resp: 17  Temp: (!) 96.3 F (35.7 C)  TempSrc: Tympanic  SpO2: 98%  Weight: 159 lb 14.4 oz (72.5 kg)  Height: 6\' 1"  (1.854 m)   Physical Exam HENT:     Mouth/Throat:     Comments: Patient is s/p glossectomy with flap reconstruction.  He has a tracheostomy and NG tube in place Cardiovascular:     Rate and Rhythm: Normal rate and regular rhythm.     Heart sounds: Normal heart sounds.  Pulmonary:     Effort: Pulmonary effort is normal.     Breath sounds:  Normal breath sounds.  Abdominal:     General: Bowel sounds are normal.     Palpations: Abdomen is soft.  Skin:  General: Skin is warm and dry.  Neurological:     Mental Status: He is alert and oriented to person, place, and time.         Latest Ref Rng & Units 04/27/2023    7:59 AM  CMP  Glucose 70 - 99 mg/dL 161   BUN 8 - 23 mg/dL 17   Creatinine 0.96 - 1.24 mg/dL 0.45   Sodium 409 - 811 mmol/L 133   Potassium 3.5 - 5.1 mmol/L 3.7   Chloride 98 - 111 mmol/L 93   CO2 22 - 32 mmol/L 30   Calcium 8.9 - 10.3 mg/dL 9.0       Latest Ref Rng & Units 04/27/2023    7:59 AM  CBC  WBC 4.0 - 10.5 K/uL 12.4   Hemoglobin 13.0 - 17.0 g/dL 91.4   Hematocrit 78.2 - 52.0 % 40.0   Platelets 150 - 400 K/uL 230     No images are attached to the encounter.  NM PET Image Initial (PI) Skull Base To Thigh  Result Date: 04/19/2023 CLINICAL DATA:  Initial treatment strategy for tongue cancer. Status post surgery. EXAM: NUCLEAR MEDICINE PET SKULL BASE TO THIGH TECHNIQUE: 8.7 mCi F-18 FDG was injected intravenously. Full-ring PET imaging was performed from the skull base to thigh after the radiotracer. CT data was obtained and used for attenuation correction and anatomic localization. Fasting blood glucose: 212 mg/dl COMPARISON:  Low-dose lung cancer screening CT chest dated 12/01/2020. CT abdomen/pelvis dated 05/03/2014. FINDINGS: Mediastinal blood pool activity: SUV max 2.0 Liver activity: SUV max NA NECK: Postsurgical changes involving the tongue and left neck. 14 mm short axis right level 2 node (series 6/image 31), max SUV 4.7. 7 mm short axis right supraclavicular node (series 6/image 46), max SUV 4.1. Additional 5 mm short axis node at the lateral base of the right neck (series 6/image 39), max SUV 4.0. Focal metabolism in the right parotid gland, max SUV 3.8, nonspecific. Small Incidental CT findings: None. CHEST: No suspicious pulmonary nodules. No hypermetabolic thoracic lymphadenopathy.  Tracheostomy in satisfactory position. Incidental CT findings: Mild atherosclerotic calcifications of the aortic arch. ABDOMEN/PELVIS: Enteric tube terminates in the proximal stomach. No abnormal hypermetabolism in the liver, spleen, pancreas, or adrenal glands. No hypermetabolic abdominopelvic lymphadenopathy. Incidental CT findings: Cholelithiasis, without associated inflammatory changes. Nonobstructing bilateral renal calculi measuring up to 2.3 cm in the left lower kidney (series 6/image 113). Bilateral renal cysts. Atherosclerotic calcifications of the abdominal aorta and branch vessels. Moderate colonic stool burden, suggesting mild constipation. Mild prostatomegaly. SKELETON: No focal hypermetabolic activity to suggest skeletal metastasis. Incidental CT findings: Degenerative changes of the visualized thoracolumbar spine. IMPRESSION: Postsurgical changes involving the tongue and left neck. 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 No evidence of metastatic disease in the chest, abdomen, or pelvis. Additional ancillary findings as above. Electronically Signed   By: Charline Bills M.D.   On: 04/19/2023 02:06   IR IMAGING GUIDED PORT INSERTION  Result Date: 04/14/2023 CLINICAL DATA:  Squamous carcinoma of the tongue and need for porta cath to begin chemotherapy. EXAM: IMPLANTED PORT A CATH PLACEMENT WITH ULTRASOUND AND FLUOROSCOPIC GUIDANCE ANESTHESIA/SEDATION: Moderate (conscious) sedation was employed during this procedure. A total of Versed 1.5 mg and Fentanyl 75 mcg was administered intravenously by radiology nursing. Moderate Sedation Time: 30 minutes. The patient's level of consciousness and vital signs were monitored continuously by radiology nursing throughout the procedure under my direct supervision. FLUOROSCOPY: 18 seconds.  1.0 mGy. PROCEDURE: The procedure, risks,  benefits, and alternatives were explained to the patient. Questions regarding the procedure were  encouraged and answered. The patient understands and consents to the procedure. A time-out was performed prior to initiating the procedure. Ultrasound was utilized to confirm patency of the right internal jugular vein. An ultrasound image was saved and recorded. The right neck and chest were prepped with chlorhexidine in a sterile fashion, and a sterile drape was applied covering the operative field. Maximum barrier sterile technique with sterile gowns and gloves were used for the procedure. Local anesthesia was provided with 1% lidocaine. After creating a small venotomy incision, a 21 gauge needle was advanced into the right internal jugular vein under direct, real-time ultrasound guidance. Ultrasound image documentation was performed. After securing guidewire access, an 8 Fr dilator was placed. A J-wire was kinked to measure appropriate catheter length. A subcutaneous port pocket was then created along the upper chest wall utilizing sharp and blunt dissection. Portable cautery was utilized. The pocket was irrigated with sterile saline. A single lumen power injectable port was chosen for placement. The 8 Fr catheter was tunneled from the port pocket site to the venotomy incision. The port was placed in the pocket. External catheter was trimmed to appropriate length based on guidewire measurement. At the venotomy, an 8 Fr peel-away sheath was placed over a guidewire. The catheter was then placed through the sheath and the sheath removed. Final catheter positioning was confirmed and documented with a fluoroscopic spot image. The port was accessed with a needle and aspirated and flushed with heparinized saline. The access needle was removed. The venotomy and port pocket incisions were closed with subcutaneous 3-0 Monocryl and subcuticular 4-0 Vicryl. Dermabond was applied to both incisions. COMPLICATIONS: COMPLICATIONS None FINDINGS: After catheter placement, the tip lies at the cavo-atrial junction. The catheter  aspirates normally and is ready for immediate use. IMPRESSION: Placement of single lumen port a cath via right internal jugular vein. The catheter tip lies at the cavo-atrial junction. A power injectable port a cath was placed and is ready for immediate use. Electronically Signed   By: Irish Lack M.D.   On: 04/14/2023 11:35     Assessment and plan- Patient is a 67 y.o. 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  He is here for on treatment assessment prior to cycle 1 of adjuvant cisplatin chemotherapy concurrent with radiation  Counts okay to proceed with cycle 1 of adjuvant cisplatin chemotherapy today at 40 mg/m.  He starts radiation today.I will see him in 1 week for cycle 2 of cisplatin  He will meet with nutrition Greig Castilla today   Visit Diagnosis 1. Tongue cancer (HCC)   2. Encounter for antineoplastic chemotherapy      Dr. Owens Shark, MD, MPH [facility] at [facility] [phone] 04/27/2023 12:15 PM

## 2023-04-27 NOTE — Patient Instructions (Signed)
[facility] [place] [facility]  Discharge Instructions: Thank you for choosing [facility] to provide your oncology and hematology care.  If you have a lab appointment with the Cancer Center, please go directly to the Cancer Center and check in at the registration area.  Wear comfortable clothing and clothing appropriate for easy access to any Portacath or PICC line.   We strive to give you quality time with your provider. You may need to reschedule your appointment if you arrive late (15 or more minutes).  Arriving late affects you and other patients whose appointments are after yours.  Also, if you miss three or more appointments without notifying the office, you may be dismissed from the clinic at the provider's discretion.      For prescription refill requests, have your pharmacy contact our office and allow 72 hours for refills to be completed.    Today you received the following chemotherapy and/or immunotherapy agents- cisplatin      To help prevent nausea and vomiting after your treatment, we encourage you to take your nausea medication as directed.  BELOW ARE SYMPTOMS THAT SHOULD BE REPORTED IMMEDIATELY: *FEVER GREATER THAN 100.4 F (38 C) OR HIGHER *CHILLS OR SWEATING *NAUSEA AND VOMITING THAT IS NOT CONTROLLED WITH YOUR NAUSEA MEDICATION *UNUSUAL SHORTNESS OF BREATH *UNUSUAL BRUISING OR BLEEDING *URINARY PROBLEMS (pain or burning when urinating, or frequent urination) *BOWEL PROBLEMS (unusual diarrhea, constipation, pain near the anus) TENDERNESS IN MOUTH AND THROAT WITH OR WITHOUT PRESENCE OF ULCERS (sore throat, sores in mouth, or a toothache) UNUSUAL RASH, SWELLING OR PAIN  UNUSUAL VAGINAL DISCHARGE OR ITCHING   Items with * indicate a potential emergency and should be followed up as soon as possible or go to the Emergency Department if any problems should occur.  Please show the CHEMOTHERAPY ALERT CARD or IMMUNOTHERAPY ALERT CARD at check-in to  the Emergency Department and triage nurse.  Should you have questions after your visit or need to cancel or reschedule your appointment, please contact [facility] [place] [facility]  [phone] and follow the prompts.  Office hours are 8:00 a.m. to 4:30 p.m. Monday - Friday. Please note that voicemails left after 4:00 p.m. may not be returned until the following business day.  We are closed weekends and major holidays. You have access to a nurse at all times for urgent questions. Please call the main number to the clinic [phone] and follow the prompts.  For any non-urgent questions, you may also contact your provider using MyChart. We now offer e-Visits for anyone 18 and older to request care online for non-urgent symptoms. For details visit [email or web address].   Also download the MyChart app! Go to the app store, search "MyChart", open the app, select [place] [facility], and log in with your MyChart username and password.    

## 2023-04-27 NOTE — Progress Notes (Signed)
Nutrition Follow-up:  Patient with SCC of oral cavity/oral tongue, stage IV.  S/p glossectomy with flap reconstruction at [facility].  Dobhoff tube in place since 03/14/23.  Patient starting radiation and chemotherapy (cisplatin) today.   Met with patient during infusion.  Has been doing 6 cartons of formula (osmolite 1.5 or ensure plus or boost plus) via dobhoff tube plus sometimes premier protein shake via tube as a snack.  Reviewed SLP note from [facility] and placed patient on soft foods with thickened liquids, thin water. Patient is taking some brunswick stew, vegetable soup and yogurt but small amounts.  Majority of nutrition is coming from tube feeding.  Reports regular bowel movement.      Medications: reviewed  Labs: glucose 369, Na 133  Anthropometrics:   Weight 159 lb 14.4 oz today  153 lb 14.4 oz on 9/26 219 lb on March 2023   Estimated Energy Needs  Kcals: 2200-2500 Protein: 100-125 g Fluid: > 2.2 L  NUTRITION DIAGNOSIS: Unintentional weight loss improved   INTERVENTION:  Coordination of care with Meghan, SLP at [facility].  Concerns with efficiency of eating. Requires multiple swallows.   Recommend removing dobhoff tube and placing G-tube for long term use.  Patient will require treatment for the next 6 weeks and side effects from radiation likely to impact eating as well.  Patient also not meeting nutritional needs orally at this time and majority of nutrition is coming from dobhoff feedings.  Message sent to MD Patient reports that [facility] is providing tube feeding formula but he is having to pay for it out of pocket.  RD to look into DME to help provide enteral support.      MONITORING, EVALUATION, GOAL: weight trends, intake   NEXT VISIT: Wednesday, Oct 16 during infusion  Onalee Steinbach B. Freida Busman, RD, LDN Registered Dietitian [phone]

## 2023-04-28 ENCOUNTER — Other Ambulatory Visit: Payer: Self-pay

## 2023-04-28 ENCOUNTER — Ambulatory Visit
Admission: RE | Admit: 2023-04-28 | Discharge: 2023-04-28 | Disposition: A | Discharge status: Home / Self Care | Payer: 59 | Source: Ambulatory Visit | Attending: Radiation Oncology | Admitting: Radiation Oncology

## 2023-04-28 ENCOUNTER — Telehealth: Payer: Self-pay

## 2023-04-28 DIAGNOSIS — Z5111 Encounter for antineoplastic chemotherapy: Secondary | ICD-10-CM | POA: Diagnosis not present

## 2023-04-28 LAB — RAD ONC ARIA SESSION SUMMARY
Course Elapsed Days: 0
Plan Fractions Treated to Date: 1
Plan Prescribed Dose Per Fraction: 2 Gy
Plan Total Fractions Prescribed: 35
Plan Total Prescribed Dose: 70 Gy
Reference Point Dosage Given to Date: 2 Gy
Reference Point Session Dosage Given: 2 Gy
Session Number: 1

## 2023-04-28 NOTE — Telephone Encounter (Signed)
Telephone call to patient for follow up after receiving first infusion.   No answer but left message stating we were calling to check on them.  Encouraged patient to call for any questions or concerns.   

## 2023-04-29 ENCOUNTER — Inpatient Hospital Stay: Payer: 59

## 2023-04-29 ENCOUNTER — Ambulatory Visit
Admission: RE | Admit: 2023-04-29 | Discharge: 2023-04-29 | Disposition: A | Discharge status: Home / Self Care | Payer: 59 | Source: Ambulatory Visit | Attending: Radiation Oncology | Admitting: Radiation Oncology

## 2023-04-29 ENCOUNTER — Other Ambulatory Visit: Payer: Self-pay

## 2023-04-29 DIAGNOSIS — Z5111 Encounter for antineoplastic chemotherapy: Secondary | ICD-10-CM | POA: Diagnosis not present

## 2023-04-29 LAB — RAD ONC ARIA SESSION SUMMARY
Course Elapsed Days: 1
Plan Fractions Treated to Date: 2
Plan Prescribed Dose Per Fraction: 2 Gy
Plan Total Fractions Prescribed: 35
Plan Total Prescribed Dose: 70 Gy
Reference Point Dosage Given to Date: 4 Gy
Reference Point Session Dosage Given: 2 Gy
Session Number: 2

## 2023-04-29 MED ORDER — OSMOLITE 1.5 CAL PO LIQD: ORAL | Status: DC

## 2023-04-29 NOTE — Progress Notes (Signed)
Nutrition Follow-up:  Patient with SCC of oral cavity/tongue, stage IV.  S/p glossectomy with flap reconstruction at [facility].  Dobhoff tube in place since 03/14/23 (surgery).  Patient started chemotherapy (cisplatin) on 10/9 and radiation on 10/10.    Spoke with patient via phone this am after speaking with [facility]. Current prescription for tube feeding was written for 2 weeks and DME requiring new prescription.    Patient is currently using 6 cartons of osmolite 1.5 daily via dobhoff tube.  Most often doing 4 feedings each day.  He is also  supplementing with ensure plus/boost plus/premier protein via tube sometimes 2 additional cartons a day.  Patient has been seen by SLP at [facility] and recommended for soft solids and thickened liquids, thin water allowed.  Patient is taking small amounts of soft solids (yogurt, brunswick stew, vegetable soup) but this is not meeting nutritional needs.  Majority of nutrition is coming from enteral nutrition at this time.     Medications: novolin  Labs: glucose 369, Na 133  Anthropometrics:   Height: 72 inches Weight: 159 lb 14.4 oz on 10/9 166 lb 02/01/2023 BMI: 21  4% weight loss in the last 3 months    Estimated Energy Needs  Kcals: 2200-2500 Protein: 100-125 g Fluid: > 2.2 L  NUTRITION DIAGNOSIS: Unintentional weight loss related to cancer and related treatment side effects as evidenced by 4% weight loss in the last 3 months and relying on feeding tube for nutrition   INTERVENTION:  Recommend osmolite 1.5, 6 cartons per day via dobhoff tube.  Flush with 60ml of water before and after each feeding (QID). Drink or provide via feeding tube additional 3 cups of fluid or provide via feeding tube for adequate hydration.  Provides  2130 calories, 89 g protein and 2280 ml free water. Meets nutritional needs at this time.   Recommend removing dobhoff tube and replacing with G-tube as anticipate long term need of enteral nutrition due to  side effects from radiation treatment.  Message sent to MD.   Enteral supplies being provided by [facility] [phone]).  MONITORING, EVALUATION, GOAL: tube feeding, weight   NEXT VISIT: Wednesday, Oct 16 during infusion  Jack Mineau B. Freida Busman, RD, LDN Registered Dietitian [phone]

## 2023-05-02 ENCOUNTER — Ambulatory Visit
Admission: RE | Admit: 2023-05-02 | Discharge: 2023-05-02 | Disposition: A | Discharge status: Home / Self Care | Payer: 59 | Source: Ambulatory Visit | Attending: Radiation Oncology | Admitting: Radiation Oncology

## 2023-05-02 ENCOUNTER — Other Ambulatory Visit: Payer: Self-pay

## 2023-05-02 DIAGNOSIS — Z5111 Encounter for antineoplastic chemotherapy: Secondary | ICD-10-CM | POA: Diagnosis not present

## 2023-05-02 LAB — RAD ONC ARIA SESSION SUMMARY
Course Elapsed Days: 4
Plan Fractions Treated to Date: 3
Plan Prescribed Dose Per Fraction: 2 Gy
Plan Total Fractions Prescribed: 35
Plan Total Prescribed Dose: 70 Gy
Reference Point Dosage Given to Date: 6 Gy
Reference Point Session Dosage Given: 2 Gy
Session Number: 3

## 2023-05-03 ENCOUNTER — Encounter: Payer: Self-pay | Admitting: Oncology

## 2023-05-03 ENCOUNTER — Ambulatory Visit
Admission: RE | Admit: 2023-05-03 | Discharge: 2023-05-03 | Disposition: A | Discharge status: Home / Self Care | Payer: 59 | Source: Ambulatory Visit | Attending: Radiation Oncology | Admitting: Radiation Oncology

## 2023-05-03 ENCOUNTER — Other Ambulatory Visit: Payer: Self-pay

## 2023-05-03 ENCOUNTER — Telehealth: Payer: Self-pay

## 2023-05-03 DIAGNOSIS — Z5111 Encounter for antineoplastic chemotherapy: Secondary | ICD-10-CM | POA: Diagnosis not present

## 2023-05-03 LAB — RAD ONC ARIA SESSION SUMMARY
Course Elapsed Days: 5
Plan Fractions Treated to Date: 4
Plan Prescribed Dose Per Fraction: 2 Gy
Plan Total Fractions Prescribed: 35
Plan Total Prescribed Dose: 70 Gy
Reference Point Dosage Given to Date: 8 Gy
Reference Point Session Dosage Given: 2 Gy
Session Number: 4

## 2023-05-03 MED FILL — Fosaprepitant Dimeglumine For IV Infusion 150 MG (Base Eq): INTRAVENOUS | Qty: 5 | Status: AC

## 2023-05-03 NOTE — Telephone Encounter (Signed)
Shane Perry from [facility] called to see if the patient made it to his appointment.

## 2023-05-04 ENCOUNTER — Ambulatory Visit
Admission: RE | Admit: 2023-05-04 | Discharge: 2023-05-04 | Disposition: A | Discharge status: Home / Self Care | Payer: 59 | Source: Ambulatory Visit | Attending: Radiation Oncology | Admitting: Radiation Oncology

## 2023-05-04 ENCOUNTER — Inpatient Hospital Stay: Payer: 59

## 2023-05-04 ENCOUNTER — Other Ambulatory Visit: Payer: Self-pay

## 2023-05-04 ENCOUNTER — Inpatient Hospital Stay: Payer: 59 | Admitting: Oncology

## 2023-05-04 ENCOUNTER — Encounter: Payer: Self-pay | Admitting: Oncology

## 2023-05-04 VITALS — BP 94/68 | HR 95 | Temp 96.0°F | Resp 17 | Wt 154.5 lb

## 2023-05-04 DIAGNOSIS — C029 Malignant neoplasm of tongue, unspecified: Secondary | ICD-10-CM | POA: Diagnosis not present

## 2023-05-04 DIAGNOSIS — Z5111 Encounter for antineoplastic chemotherapy: Secondary | ICD-10-CM | POA: Diagnosis not present

## 2023-05-04 LAB — CBC WITH DIFFERENTIAL (CANCER CENTER ONLY)
Abs Immature Granulocytes: 0.04 10*3/uL (ref 0.00–0.07)
Basophils Absolute: 0 10*3/uL (ref 0.0–0.1)
Basophils Relative: 0 %
Eosinophils Absolute: 0.2 10*3/uL (ref 0.0–0.5)
Eosinophils Relative: 1 %
HCT: 39.2 % (ref 39.0–52.0)
Hemoglobin: 13.4 g/dL (ref 13.0–17.0)
Immature Granulocytes: 0 %
Lymphocytes Relative: 6 %
Lymphs Abs: 0.7 10*3/uL (ref 0.7–4.0)
MCH: 31.3 pg (ref 26.0–34.0)
MCHC: 34.2 g/dL (ref 30.0–36.0)
MCV: 91.6 fL (ref 80.0–100.0)
Monocytes Absolute: 0.7 10*3/uL (ref 0.1–1.0)
Monocytes Relative: 6 %
Neutro Abs: 9.9 10*3/uL — ABNORMAL HIGH (ref 1.7–7.7)
Neutrophils Relative %: 87 %
Platelet Count: 248 10*3/uL (ref 150–400)
RBC: 4.28 MIL/uL (ref 4.22–5.81)
RDW: 12.5 % (ref 11.5–15.5)
WBC Count: 11.6 10*3/uL — ABNORMAL HIGH (ref 4.0–10.5)
nRBC: 0 % (ref 0.0–0.2)

## 2023-05-04 LAB — RAD ONC ARIA SESSION SUMMARY
Course Elapsed Days: 6
Plan Fractions Treated to Date: 5
Plan Prescribed Dose Per Fraction: 2 Gy
Plan Total Fractions Prescribed: 35
Plan Total Prescribed Dose: 70 Gy
Reference Point Dosage Given to Date: 10 Gy
Reference Point Session Dosage Given: 2 Gy
Session Number: 5

## 2023-05-04 LAB — BASIC METABOLIC PANEL - CANCER CENTER ONLY
Anion gap: 10 (ref 5–15)
BUN: 19 mg/dL (ref 8–23)
CO2: 31 mmol/L (ref 22–32)
Calcium: 9.2 mg/dL (ref 8.9–10.3)
Chloride: 91 mmol/L — ABNORMAL LOW (ref 98–111)
Creatinine: 0.52 mg/dL — ABNORMAL LOW (ref 0.61–1.24)
GFR, Estimated: >60 mL/min (ref >60)
Glucose, Bld: 328 mg/dL — ABNORMAL HIGH (ref 70–99)
Potassium: 3.8 mmol/L (ref 3.5–5.1)
Sodium: 132 mmol/L — ABNORMAL LOW (ref 135–145)

## 2023-05-04 LAB — MAGNESIUM: Magnesium: 1.9 mg/dL (ref 1.7–2.4)

## 2023-05-04 MED ORDER — DEXAMETHASONE SODIUM PHOSPHATE 10 MG/ML IJ SOLN
4.0000 mg | Freq: Once | INTRAMUSCULAR | Status: AC
  Administered 2023-05-04: 4 mg via INTRAVENOUS
  Filled 2023-05-04: qty 1

## 2023-05-04 MED ORDER — SODIUM CHLORIDE 0.9% FLUSH
10.0000 mL | INTRAVENOUS | Status: DC | PRN
  Administered 2023-05-04: 10 mL
  Filled 2023-05-04: qty 10

## 2023-05-04 MED ORDER — SODIUM CHLORIDE 0.9 % IV SOLN
150.0000 mg | Freq: Once | INTRAVENOUS | Status: AC
  Administered 2023-05-04: 150 mg via INTRAVENOUS
  Filled 2023-05-04: qty 150

## 2023-05-04 MED ORDER — POTASSIUM CHLORIDE IN NACL 20-0.9 MEQ/L-% IV SOLN
Freq: Once | INTRAVENOUS | Status: AC
  Filled 2023-05-04: qty 1000

## 2023-05-04 MED ORDER — HEPARIN SOD (PORK) LOCK FLUSH 100 UNIT/ML IV SOLN
500.0000 [IU] | Freq: Once | INTRAVENOUS | Status: AC | PRN
  Administered 2023-05-04: 500 [IU]
  Filled 2023-05-04: qty 5

## 2023-05-04 MED ORDER — SODIUM CHLORIDE 0.9 % IV SOLN
Freq: Once | INTRAVENOUS | Status: AC
  Filled 2023-05-04: qty 250

## 2023-05-04 MED ORDER — PALONOSETRON HCL INJECTION 0.25 MG/5ML
0.2500 mg | Freq: Once | INTRAVENOUS | Status: AC
  Administered 2023-05-04: 0.25 mg via INTRAVENOUS
  Filled 2023-05-04: qty 5

## 2023-05-04 MED ORDER — SODIUM CHLORIDE 0.9 % IV SOLN
40.0000 mg/m2 | Freq: Once | INTRAVENOUS | Status: AC
  Administered 2023-05-04: 77 mg via INTRAVENOUS
  Filled 2023-05-04: qty 77

## 2023-05-04 MED ORDER — MAGNESIUM SULFATE 2 GM/50ML IV SOLN
2.0000 g | Freq: Once | INTRAVENOUS | Status: AC
  Administered 2023-05-04: 2 g via INTRAVENOUS
  Filled 2023-05-04: qty 50

## 2023-05-04 NOTE — Progress Notes (Addendum)
Nutrition Follow-up:  Patient with SCC of oral cavity/oral tongue, stage IV.  S/p glossectomy with flap reconstruction at [facility]. Dobhoff tube in place since 03/14/23.  Patient receiving cisplatin and radiation.   Met with patient during infusion.  Patient continues to give 6 cartons of osmolite 1.5 via dobhoff tube and will either drink or give via tube ensure plus or equivalent.  Eating soft foods and drinking liquids orally but relying mostly on tube feeding.  Planning removal of dobhoff and G-tube placement.  Meeting with surgeon this week.      Medications: only using insulin not taking other DM medication because some is extended release  Labs: glucose 328  Anthropometrics:   Weight 154 lb  159 lb 14.4 oz on 10/9  153 lb 14.4 oz on 9/26 219 lb on March 2023   Estimated Energy Needs  Kcals: 2200-2500 Protein: 100-125 g Fluid: > 2.2 L  NUTRITION DIAGNOSIS: Unintentional weight loss stable    INTERVENTION:  Continue osmolite 1.5, 6 cartons per day (2 cartons 3 times a day) Flush with 60ml of water before and after each feeding. Give or drink additional 3 cups of water for hydration.   Discussed G tube care and how to give bolus feeding with new tube.  Patient verbalized understanding.  Asked patient to touch base with PCP regarding blood glucose and feeding tube status.  He verbalized understanding     MONITORING, EVALUATION, GOAL: weight trends, intake, tube feeding   NEXT VISIT: Wednesday, Oct 23rd during infusion  Jerie Basford B. Freida Busman, RD, LDN Registered Dietitian [phone]

## 2023-05-04 NOTE — Progress Notes (Signed)
Per Dr. Smith Robert, may resume post-hydration with Cisplatin.

## 2023-05-04 NOTE — Patient Instructions (Signed)
[place] [facility]  Discharge Instructions: Thank you for choosing [facility] to provide your oncology and hematology care.  If you have a lab appointment with the Cancer Center, please go directly to the Cancer Center and check in at the registration area.  Wear comfortable clothing and clothing appropriate for easy access to any Portacath or PICC line.   We strive to give you quality time with your provider. You may need to reschedule your appointment if you arrive late (15 or more minutes).  Arriving late affects you and other patients whose appointments are after yours.  Also, if you miss three or more appointments without notifying the office, you may be dismissed from the clinic at the provider's discretion.      For prescription refill requests, have your pharmacy contact our office and allow 72 hours for refills to be completed.    Today you received the following chemotherapy and/or immunotherapy agents Cisplatin      To help prevent nausea and vomiting after your treatment, we encourage you to take your nausea medication as directed.  BELOW ARE SYMPTOMS THAT SHOULD BE REPORTED IMMEDIATELY: *FEVER GREATER THAN 100.4 F (38 C) OR HIGHER *CHILLS OR SWEATING *NAUSEA AND VOMITING THAT IS NOT CONTROLLED WITH YOUR NAUSEA MEDICATION *UNUSUAL SHORTNESS OF BREATH *UNUSUAL BRUISING OR BLEEDING *URINARY PROBLEMS (pain or burning when urinating, or frequent urination) *BOWEL PROBLEMS (unusual diarrhea, constipation, pain near the anus) TENDERNESS IN MOUTH AND THROAT WITH OR WITHOUT PRESENCE OF ULCERS (sore throat, sores in mouth, or a toothache) UNUSUAL RASH, SWELLING OR PAIN  UNUSUAL VAGINAL DISCHARGE OR ITCHING   Items with * indicate a potential emergency and should be followed up as soon as possible or go to the Emergency Department if any problems should occur.  Please show the CHEMOTHERAPY ALERT CARD or IMMUNOTHERAPY ALERT CARD at check-in to  the Emergency Department and triage nurse.  Should you have questions after your visit or need to cancel or reschedule your appointment, please contact [place] [facility]  [phone] and follow the prompts.  Office hours are 8:00 a.m. to 4:30 p.m. Monday - Friday. Please note that voicemails left after 4:00 p.m. may not be returned until the following business day.  We are closed weekends and major holidays. You have access to a nurse at all times for urgent questions. Please call the main number to the clinic [phone] and follow the prompts.  For any non-urgent questions, you may also contact your provider using MyChart. We now offer e-Visits for anyone 74 and older to request care online for non-urgent symptoms. For details visit [email or web address].   Also download the MyChart app! Go to the app store, search "MyChart", open the app, select [place], and log in with your MyChart username and password.

## 2023-05-04 NOTE — Progress Notes (Unsigned)
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  He was subsequently seen by Dr. Lou Cal from ENT surgery at [facility] and underwent CT soft tissue neck as well as CT chest.  CT soft tissue neck showed ulcerated lesion involving the anterior lateral tongue measuring 1 x 3.4 cm in the axial plane.  1.9 cm in the coronal plane.  Relative sparing of the tongue base and floor of mouth.  Parapharyngeal and retropharyngeal spaces appear intact.  Small subcentimeter lymph nodes within bilateral neck which were nonspecific.  CT chest showed subcentimeter lung nodules that were nonspecific.  He had a biopsy of this tongue mass which was consistent with squamous cell carcinoma moderately differentiated.    He was seen by [facility] ENT and underwent glossectomy and modified neck dissection on 03/14/2023 with tracheostomy and NG tube placement. Final Diagnosis        A. 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   B. LEFT SUBMANDIBULAR CONTENTS, DISSECTION:               Benign submandibular gland; negative for carcinoma.              Three benign lymph nodes, negative for metastatic carcinoma (0/3).                C. SUBMENTAL CONTENTS, DISSECTION:               Four benign lymph nodes, negative for metastatic carcinoma (0/4).   D. RIGHT SUBMANDIBULAR CONTENTS, EXCISION:               Benign submandibular tissue.              Negative for carcinoma.   E. PAROTID GLAND, LEFT TAIL, EXCISION:               [place] tumor (1.1 cm); the tumor appears completely excised.              Negative for carcinoma.              Four benign lymph nodes, negative for metastatic carcinoma (0/4).   F. LEFT NECK CONTENTS LEVELS 2A, 3, 4 AND 5, DISSECTION:               Eight out of thirty-one lymph nodes, positive for metastatic squamous cell carcinoma (8/31).                           Size of largest nodal metastatic deposit: 3.3 cm. Extranodal extension: Identified. Benign salivary gland tissue; negative for malignancy.               G. LEFT NECK CONTENTS LEVELS 2B, DISSECTION:  Nine benign lymph nodes, negative for metastatic carcinoma (0/9).    H. NODULE AT LEFT CAROTID BULB, EXCISION:               One benign lymph node, positive for metastatic squamous cell carcinoma (1/1). Size of metastatic deposit: 0.4 cm. Extranodal extension: Not identified.   I. TONGUE, LEFT, GLOSSECTOMY  :               Invasive squamous cell carcinoma, keratinizing, poorly differentiated with tumor necrosis.                           Tumor size: 5.0 cm in greatest dimension.                           Depth of invasion: 12 mm                           Lymphovascular and perineural invasion: Identified.                           Margins: Negative                           Pathologic stage: pT4a, pN3b.                           Please see CAP synoptic report.               J. TEETH 1,2,14,15, and 19, EXTRACTION:               Teeth (gross only)   Plan is for adjuvant concurrent chemoradiation with weekly Cisplatin.  PET/CT scan in September 2024 showed hypermetabolic right cervical right supraclavicular  adenopathy but no other evidence of distant metastatic disease.    Interval history-patient is using his Dobbhoff tube along with oral intake to maintain his nutrition.  Denies any significant pain at this time.  Tolerated cycle 1 of chemotherapy well  ECOG PS- 1 Pain scale- 0 Opioid associated constipation- no  Review of systems- Review of Systems  Constitutional:  Positive for malaise/fatigue. Negative for chills, fever and weight loss.  HENT:  Negative for congestion, ear discharge and nosebleeds.   Eyes:  Negative for blurred vision.  Respiratory:  Negative for cough, hemoptysis, sputum production, shortness of breath and wheezing.   Cardiovascular:  Negative for chest pain, palpitations, orthopnea and claudication.  Gastrointestinal:  Negative for abdominal pain, blood in stool, constipation, diarrhea, heartburn, melena, nausea and vomiting.  Genitourinary:  Negative for dysuria, flank pain, frequency, hematuria and urgency.  Musculoskeletal:  Negative for back pain, joint pain and myalgias.  Skin:  Negative for rash.  Neurological:  Negative for dizziness, tingling, focal weakness, seizures, weakness and headaches.  Endo/Heme/Allergies:  Does not bruise/bleed easily.  Psychiatric/Behavioral:  Negative for depression and suicidal ideas. The patient does not have insomnia.       Allergies  Allergen Reactions   Statins Other (See Comments)    Loss of balance   Lisinopril Other (See Comments)    hoarseness     Past Medical History:  Diagnosis Date   Cancer (HCC)    Diabetes mellitus without complication (HCC)    Hyperlipidemia    Hypertension    Kidney stones    Squamous  cell carcinoma, tongue border (HCC)    Weight loss      Past Surgical History:  Procedure Laterality Date   COLONOSCOPY     COLONOSCOPY WITH PROPOFOL N/A 04/26/2023   Procedure: COLONOSCOPY WITH PROPOFOL;  Surgeon: [name], MD;  Location: [facility] ENDOSCOPY;  Service: Endoscopy;  Laterality: N/A;    CYST REMOVAL NECK     IR IMAGING GUIDED PORT INSERTION  04/14/2023   left arm muscle removal for tongue     partial tongue removal  03/14/2023   tongue cancer     TRACHEOSTOMY  03/14/2023    Social History   Socioeconomic History   Marital status: Divorced    Spouse name: Not on file   Number of children: Not on file   Years of education: Not on file   Highest education level: Not on file  Occupational History   Not on file  Tobacco Use   Smoking status: Every Day    Current packs/day: 1.00    Average packs/day: 1 pack/day for 50.0 years (50.0 ttl pk-yrs)    Types: Cigarettes   Smokeless tobacco: Never  Vaping Use   Vaping status: Never Used  Substance and Sexual Activity   Alcohol use: Yes    Alcohol/week: 1.0 standard drink of alcohol    Types: 1 Cans of beer per week    Comment: Rare   Drug use: Never   Sexual activity: Not on file  Other Topics Concern   Not on file  Social History Narrative   Not on file   Social Determinants of Health   Financial Resource Strain: Patient Declined (02/18/2023)   Received [facility]   Overall Financial Resource Strain (CARDIA)    Difficulty of Paying Living Expenses: Patient declined  Food Insecurity: Medium Risk (03/21/2023)   Received from [facility]   Hunger Vital Sign    Worried About Running Out of Food in the Last Year: Sometimes true    Ran Out of Food in the Last Year: Never true  Transportation Needs: No Transportation Needs (03/21/2023)   Received from [facility]    In the past 12 months, has lack of reliable transportation kept you from medical appointments, meetings, work or from getting things needed for daily living? : No  Physical Activity: Insufficiently Active (02/18/2023)   Received from [facility]   Exercise Vital Sign    Days of Exercise per Week: 3 days    Minutes of Exercise per Session: 30 min  Stress: No Stress Concern Present (02/18/2023)   Received from [facility] - Occupational Stress Questionnaire    Feeling of Stress : Only a little  Social Connections: Moderately Integrated (02/18/2023)   Received from [facility]   Social Network    How would you rate your social network (family, work, friends)?: Adequate participation with social networks  Intimate Partner Violence: Not At Risk (02/18/2023)   Received from [facility]   HITS    Over the last 12 months how often did your partner physically hurt you?: 1    Over the last 12 months how often did your partner insult you or talk down to you?: 1    Over the last 12 months how often did your partner threaten you with physical harm?: 1    Over the last 12 months how often did your partner scream or curse at you?: 1    Family History  Problem Relation  Age of Onset   Hypertension Mother    Alzheimer's disease Mother    Alzheimer's disease Father    Heart disease Sister    Diabetes type I Brother      Current Outpatient Medications:    acetaminophen (TYLENOL) 500 MG tablet, Take 500 mg by mouth every 4 (four) hours as needed. (Patient not taking: Reported on 04/27/2023), Disp: , Rfl:    aspirin 81 MG chewable tablet, Chew 81 mg by mouth daily., Disp: , Rfl:    Blood Glucose Monitoring Suppl (ONETOUCH VERIO FLEX SYSTEM) w/Device KIT, by Does not apply route., Disp: , Rfl:    dexamethasone (DECADRON) 4 MG tablet, Take 2 tablets (8 mg) by mouth daily x 3 days starting the day after cisplatin chemotherapy. Take with food. (Patient not taking: Reported on 04/26/2023), Disp: 30 tablet, Rfl: 1   empagliflozin (JARDIANCE) 10 MG TABS tablet, Take 1 tablet by mouth every morning. (Patient not taking: Reported on 01/31/2023), Disp: , Rfl:    glipiZIDE (GLUCOTROL XL) 10 MG 24 hr tablet, Take 10 mg by mouth 2 (two) times daily. (Patient not taking: Reported on 04/26/2023), Disp: , Rfl:    glucose blood test strip, Use daily. Pharmacy, please dispense this brand of blood  glucose test strips: Other: One Touch Verio Flex (Patient not taking: Reported on 04/27/2023), Disp: , Rfl:    insulin regular (NOVOLIN R) 100 units/mL injection, Inject into the skin 3 (three) times daily before meals., Disp: , Rfl:    lidocaine-prilocaine (EMLA) cream, Apply to affected area once, Disp: 30 g, Rfl: 3   losartan (COZAAR) 50 MG tablet, Take 50 mg by mouth daily. (Patient not taking: Reported on 04/27/2023), Disp: , Rfl:    metFORMIN (GLUCOPHAGE-XR) 500 MG 24 hr tablet, Take 1,000 mg by mouth 2 (two) times daily. (Patient not taking: Reported on 04/27/2023), Disp: , Rfl:    metoprolol succinate (TOPROL-XL) 50 MG 24 hr tablet, TAKE 1 TABLET BY MOUTH ONCE DAILY WITH  OR  IMMEDIATELY  FOLLOWING  A  MEAL (Patient not taking: Reported on 04/27/2023), Disp: 90 tablet, Rfl: 1   Multiple Vitamin (MULTIVITAMIN) tablet, Take 1 tablet by mouth daily. (Patient not taking: Reported on 04/27/2023), Disp: , Rfl:    Nutritional Supplements (FEEDING SUPPLEMENT, OSMOLITE 1.5 CAL,) LIQD, Give 1.5 cartons 4 times a day via feeding tube.  Flush with 60ml of water before and after each feeding. Provide additional 3 cups ( ) via feeding tube or orally to meet hydration needs.   Needs bolus supplies for tube feeding., Disp: , Rfl:    ondansetron (ZOFRAN) 8 MG tablet, Take 1 tablet (8 mg total) by mouth every 8 (eight) hours as needed for nausea or vomiting. Start on the third day after cisplatin. (Patient not taking: Reported on 04/27/2023), Disp: 30 tablet, Rfl: 1   prochlorperazine (COMPAZINE) 10 MG tablet, Take 1 tablet (10 mg total) by mouth every 6 (six) hours as needed (Nausea or vomiting). (Patient not taking: Reported on 04/27/2023), Disp: 30 tablet, Rfl: 1 No current facility-administered medications for this visit.  Facility-Administered Medications Ordered in Other Visits:    sodium chloride flush (NS) 0.9 % injection 10 mL, 10 mL, Intracatheter, PRN, Creig Hines, MD, 10 mL at 05/04/23  1513  Physical exam:  Vitals:   05/04/23 0907  BP: 94/68  Pulse: 95  Resp: 17  Temp: (!) 96 F (35.6 C)  TempSrc: Tympanic  SpO2: 97%  Weight: 154 lb 8 oz (70.1 kg)   Physical  Exam Constitutional:      Comments: Dobbhoff tube in place  HENT:     Mouth/Throat:     Comments: Tracheostomy in place.  S/p reconstruction tongue surgery Cardiovascular:     Rate and Rhythm: Normal rate and regular rhythm.     Heart sounds: Normal heart sounds.  Pulmonary:     Effort: Pulmonary effort is normal.     Breath sounds: Normal breath sounds.  Abdominal:     General: Bowel sounds are normal.     Palpations: Abdomen is soft.  Musculoskeletal:     Cervical back: Normal range of motion.  Skin:    General: Skin is warm and dry.  Neurological:     Mental Status: He is alert and oriented to person, place, and time.         Latest Ref Rng & Units 05/04/2023    8:44 AM  CMP  Glucose 70 - 99 mg/dL 130   BUN 8 - 23 mg/dL 19   Creatinine 8.65 - 1.24 mg/dL 7.84   Sodium 696 - 295 mmol/L 132   Potassium 3.5 - 5.1 mmol/L 3.8   Chloride 98 - 111 mmol/L 91   CO2 22 - 32 mmol/L 31   Calcium 8.9 - 10.3 mg/dL 9.2       Latest Ref Rng & Units 05/04/2023    8:44 AM  CBC  WBC 4.0 - 10.5 K/uL 11.6   Hemoglobin 13.0 - 17.0 g/dL 28.4   Hematocrit 13.2 - 52.0 % 39.2   Platelets 150 - 400 K/uL 248     No images are attached to the encounter.  NM PET Image Initial (PI) Skull Base To Thigh  Result Date: 04/19/2023 CLINICAL DATA:  Initial treatment strategy for tongue cancer. Status post surgery. EXAM: NUCLEAR MEDICINE PET SKULL BASE TO THIGH TECHNIQUE: 8.7 mCi F-18 FDG was injected intravenously. Full-ring PET imaging was performed from the skull base to thigh after the radiotracer. CT data was obtained and used for attenuation correction and anatomic localization. Fasting blood glucose: 212 mg/dl COMPARISON:  Low-dose lung cancer screening CT chest dated 12/01/2020. CT abdomen/pelvis dated  05/03/2014. FINDINGS: Mediastinal blood pool activity: SUV max 2.0 Liver activity: SUV max NA NECK: Postsurgical changes involving the tongue and left neck. 14 mm short axis right level 2 node (series 6/image 31), max SUV 4.7. 7 mm short axis right supraclavicular node (series 6/image 46), max SUV 4.1. Additional 5 mm short axis node at the lateral base of the right neck (series 6/image 39), max SUV 4.0. Focal metabolism in the right parotid gland, max SUV 3.8, nonspecific. Small Incidental CT findings: None. CHEST: No suspicious pulmonary nodules. No hypermetabolic thoracic lymphadenopathy. Tracheostomy in satisfactory position. Incidental CT findings: Mild atherosclerotic calcifications of the aortic arch. ABDOMEN/PELVIS: Enteric tube terminates in the proximal stomach. No abnormal hypermetabolism in the liver, spleen, pancreas, or adrenal glands. No hypermetabolic abdominopelvic lymphadenopathy. Incidental CT findings: Cholelithiasis, without associated inflammatory changes. Nonobstructing bilateral renal calculi measuring up to 2.3 cm in the left lower kidney (series 6/image 113). Bilateral renal cysts. Atherosclerotic calcifications of the abdominal aorta and branch vessels. Moderate colonic stool burden, suggesting mild constipation. Mild prostatomegaly. SKELETON: No focal hypermetabolic activity to suggest skeletal metastasis. Incidental CT findings: Degenerative changes of the visualized thoracolumbar spine. IMPRESSION: Postsurgical changes involving the tongue and left neck. 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 No evidence of metastatic disease in the chest, abdomen, or pelvis. Additional ancillary findings as above. Electronically Signed  By: Charline Bills M.D.   On: 04/19/2023 02:06   IR IMAGING GUIDED PORT INSERTION  Result Date: 04/14/2023 CLINICAL DATA:  Squamous carcinoma of the tongue and need for porta cath to begin chemotherapy. EXAM: IMPLANTED PORT A CATH  PLACEMENT WITH ULTRASOUND AND FLUOROSCOPIC GUIDANCE ANESTHESIA/SEDATION: Moderate (conscious) sedation was employed during this procedure. A total of Versed 1.5 mg and Fentanyl 75 mcg was administered intravenously by radiology nursing. Moderate Sedation Time: 30 minutes. The patient's level of consciousness and vital signs were monitored continuously by radiology nursing throughout the procedure under my direct supervision. FLUOROSCOPY: 18 seconds.  1.0 mGy. PROCEDURE: The procedure, risks, benefits, and alternatives were explained to the patient. Questions regarding the procedure were encouraged and answered. The patient understands and consents to the procedure. A time-out was performed prior to initiating the procedure. Ultrasound was utilized to confirm patency of the right internal jugular vein. An ultrasound image was saved and recorded. The right neck and chest were prepped with chlorhexidine in a sterile fashion, and a sterile drape was applied covering the operative field. Maximum barrier sterile technique with sterile gowns and gloves were used for the procedure. Local anesthesia was provided with 1% lidocaine. After creating a small venotomy incision, a 21 gauge needle was advanced into the right internal jugular vein under direct, real-time ultrasound guidance. Ultrasound image documentation was performed. After securing guidewire access, an 8 Fr dilator was placed. A J-wire was kinked to measure appropriate catheter length. A subcutaneous port pocket was then created along the upper chest wall utilizing sharp and blunt dissection. Portable cautery was utilized. The pocket was irrigated with sterile saline. A single lumen power injectable port was chosen for placement. The 8 Fr catheter was tunneled from the port pocket site to the venotomy incision. The port was placed in the pocket. External catheter was trimmed to appropriate length based on guidewire measurement. At the venotomy, an 8 Fr peel-away  sheath was placed over a guidewire. The catheter was then placed through the sheath and the sheath removed. Final catheter positioning was confirmed and documented with a fluoroscopic spot image. The port was accessed with a needle and aspirated and flushed with heparinized saline. The access needle was removed. The venotomy and port pocket incisions were closed with subcutaneous 3-0 Monocryl and subcuticular 4-0 Vicryl. Dermabond was applied to both incisions. COMPLICATIONS: COMPLICATIONS None FINDINGS: After catheter placement, the tip lies at the cavo-atrial junction. The catheter aspirates normally and is ready for immediate use. IMPRESSION: Placement of single lumen port a cath via right internal jugular vein. The catheter tip lies at the cavo-atrial junction. A power injectable port a cath was placed and is ready for immediate use. Electronically Signed   By: Irish Lack M.D.   On: 04/14/2023 11:35     Assessment and plan- Patient is a 67 y.o. 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  He is here for on treatment assessment prior to cycle 2 of adjuvant cisplatin chemotherapy concurrent with radiation  Counts okay to proceed with cycle 2 of cisplatin chemotherapy today.  He will directly proceed to cycle 3 next week and I will see him back in 2 weeks for cycle 4.  Plan is to complete total 7 weeks of treatment and we will plan to get a PET CT scan about 10 to 12 weeks after radiation.  Dog of tube is not a permanent solution for his feeding and patient is still  relying on it significantly in addition to his oral intake for his nutritional needs.  We discussed taking off the Dobbhoff tube and getting a PEG tube placed.  Patient is agreeable.  I am referring him to general surgery for the same   Visit Diagnosis 1. Encounter for antineoplastic chemotherapy   2. Tongue cancer (HCC)      Dr. Owens Shark, MD, MPH [facility] [phone] 05/04/2023 4:48 PM

## 2023-05-05 ENCOUNTER — Other Ambulatory Visit: Payer: Self-pay

## 2023-05-05 ENCOUNTER — Encounter: Payer: Self-pay | Admitting: Oncology

## 2023-05-05 ENCOUNTER — Ambulatory Visit
Admission: RE | Admit: 2023-05-05 | Discharge: 2023-05-05 | Disposition: A | Discharge status: Home / Self Care | Payer: 59 | Source: Ambulatory Visit | Attending: Radiation Oncology | Admitting: Radiation Oncology

## 2023-05-05 DIAGNOSIS — Z5111 Encounter for antineoplastic chemotherapy: Secondary | ICD-10-CM | POA: Diagnosis not present

## 2023-05-05 LAB — RAD ONC ARIA SESSION SUMMARY
Course Elapsed Days: 7
Plan Fractions Treated to Date: 6
Plan Prescribed Dose Per Fraction: 2 Gy
Plan Total Fractions Prescribed: 35
Plan Total Prescribed Dose: 70 Gy
Reference Point Dosage Given to Date: 12 Gy
Reference Point Session Dosage Given: 2 Gy
Session Number: 6

## 2023-05-06 ENCOUNTER — Telehealth: Payer: Self-pay | Admitting: Surgery

## 2023-05-06 ENCOUNTER — Ambulatory Visit
Admission: RE | Admit: 2023-05-06 | Discharge: 2023-05-06 | Disposition: A | Discharge status: Home / Self Care | Payer: 59 | Source: Ambulatory Visit | Attending: Radiation Oncology | Admitting: Radiation Oncology

## 2023-05-06 ENCOUNTER — Other Ambulatory Visit: Payer: Self-pay

## 2023-05-06 ENCOUNTER — Encounter: Payer: Self-pay | Admitting: Surgery

## 2023-05-06 ENCOUNTER — Ambulatory Visit (INDEPENDENT_AMBULATORY_CARE_PROVIDER_SITE_OTHER): Payer: 59 | Admitting: Surgery

## 2023-05-06 VITALS — BP 119/62 | HR 89 | Temp 98.0°F | Ht 73.0 in | Wt 156.0 lb

## 2023-05-06 DIAGNOSIS — C029 Malignant neoplasm of tongue, unspecified: Secondary | ICD-10-CM

## 2023-05-06 DIAGNOSIS — Z5111 Encounter for antineoplastic chemotherapy: Secondary | ICD-10-CM | POA: Diagnosis not present

## 2023-05-06 LAB — RAD ONC ARIA SESSION SUMMARY
Course Elapsed Days: 8
Plan Fractions Treated to Date: 7
Plan Prescribed Dose Per Fraction: 2 Gy
Plan Total Fractions Prescribed: 35
Plan Total Prescribed Dose: 70 Gy
Reference Point Dosage Given to Date: 14 Gy
Reference Point Session Dosage Given: 2 Gy
Session Number: 7

## 2023-05-06 MED ORDER — GABAPENTIN 250 MG/5ML PO SOLN: 300.0000 mg | ORAL | Status: DC

## 2023-05-06 MED ORDER — ACETAMINOPHEN 160 MG/5ML PO SOLN: 960.0000 mg | ORAL | Status: DC

## 2023-05-06 NOTE — H&P (View-Only) (Signed)
05/06/2023  Reason for Visit:  Tongue cancer requiring feeding tube for nutrition  Requesting Provider:  Owens Shark, MD  History of Present Illness: Shane Perry is a 67 y.o. male with a history of Stage IV tongue cancer.  He had left hemiglossectomy with reconstruction and neck dissection and tracheostomy on 03/14/23 at [facility].  He currently has a Dobhoff tube for feedings.  He has recently started concurrent chemoradiation and he is in need for more long-term access for nutrition.  The patient reports that he is able to swallow liquids and yogurt consistency, but is unable to swallow solid food.  He is also starting to have more discomfort in his throat from radiation.  He is trying to drink Ensure shakes.  He also reports he is having difficulty with his daily medications, and he's trying to crush some of his medications to be able to take them.  He does report missing medications this week, particularly diabetic meds.    Past Medical History: Past Medical History:  Diagnosis Date   Cancer (HCC)    Diabetes mellitus without complication (HCC)    Hyperlipidemia    Hypertension    Kidney stones    Squamous cell carcinoma, tongue border (HCC)    Weight loss      Past Surgical History: Past Surgical History:  Procedure Laterality Date   COLONOSCOPY     COLONOSCOPY WITH PROPOFOL N/A 04/26/2023   Procedure: COLONOSCOPY WITH PROPOFOL;  Surgeon: [name], MD;  Location: [facility] ENDOSCOPY;  Service: Endoscopy;  Laterality: N/A;   CYST REMOVAL NECK     IR IMAGING GUIDED PORT INSERTION  04/14/2023   left arm muscle removal for tongue     partial tongue removal  03/14/2023   tongue cancer     TRACHEOSTOMY  03/14/2023    Home Medications: Prior to Admission medications   Medication Sig Start Date End Date Taking? Authorizing Provider  acetaminophen (TYLENOL) 160 MG/5ML solution Take 320 mg by mouth every 6 (six) hours as needed.   Yes [provider]  aspirin 81 MG  chewable tablet Chew 81 mg by mouth daily.   Yes [provider]  Blood Glucose Monitoring Suppl (ONETOUCH VERIO FLEX SYSTEM) w/Device KIT by Does not apply route. 10/14/22 10/14/23 Yes [provider]  glucose blood test strip  02/10/23 02/10/24 Yes [provider]  insulin regular (NOVOLIN R) 100 units/mL injection Inject into the skin 3 (three) times daily before meals.   Yes [provider]  lidocaine-prilocaine (EMLA) cream Apply to affected area once 03/29/23  Yes Creig Hines, MD  Nutritional Supplements (FEEDING SUPPLEMENT, OSMOLITE 1.5 CAL,) LIQD Give 1.5 cartons 4 times a day via feeding tube.  Flush with 60ml of water before and after each feeding. Provide additional 3 cups ( ) via feeding tube or orally to meet hydration needs.   Needs bolus supplies for tube feeding. 04/29/23  Yes Creig Hines, MD  dexamethasone (DECADRON) 4 MG tablet Take 2 tablets (8 mg) by mouth daily x 3 days starting the day after cisplatin chemotherapy. Take with food. Patient not taking: Reported on 04/26/2023 03/29/23   Creig Hines, MD  empagliflozin (JARDIANCE) 10 MG TABS tablet Take 1 tablet by mouth every morning. Patient not taking: Reported on 01/31/2023 01/30/21   [provider]  glipiZIDE (GLUCOTROL XL) 10 MG 24 hr tablet Take 10 mg by mouth 2 (two) times daily. Patient not taking: Reported on 04/26/2023 04/01/21   [provider]  losartan (COZAAR) 50 MG tablet Take 50 mg by mouth daily. Patient not taking: Reported on 04/27/2023 08/11/21   [provider]  metFORMIN (GLUCOPHAGE-XR) 500 MG 24 hr tablet Take 1,000 mg by mouth 2 (two) times daily. Patient not taking: Reported on 04/27/2023 03/19/21   [provider]  metoprolol succinate (TOPROL-XL) 50 MG 24 hr tablet TAKE 1 TABLET BY MOUTH ONCE DAILY WITH  OR  IMMEDIATELY  FOLLOWING  A  MEAL Patient not taking: Reported on 04/27/2023 05/13/22   Antonieta Iba, MD  Multiple Vitamin  (MULTIVITAMIN) tablet Take 1 tablet by mouth daily. Patient not taking: Reported on 04/27/2023    [provider]  ondansetron (ZOFRAN) 8 MG tablet Take 1 tablet (8 mg total) by mouth every 8 (eight) hours as needed for nausea or vomiting. Start on the third day after cisplatin. Patient not taking: Reported on 04/27/2023 03/29/23   Creig Hines, MD  prochlorperazine (COMPAZINE) 10 MG tablet Take 1 tablet (10 mg total) by mouth every 6 (six) hours as needed (Nausea or vomiting). Patient not taking: Reported on 04/27/2023 03/29/23   Creig Hines, MD    Allergies: Allergies  Allergen Reactions   Statins Other (See Comments)    Loss of balance   Varenicline Anxiety and Other (See Comments)    violent  Violent, agitation   Lisinopril Other (See Comments)    hoarseness    Social History:  reports that he has been smoking cigarettes. He has a 50 pack-year smoking history. He has been exposed to tobacco smoke. He has never used smokeless tobacco. He reports current alcohol use of about 1.0 standard drink of alcohol per week. He reports that he does not use drugs.   Family History: Family History  Problem Relation Age of Onset   Hypertension Mother    Alzheimer's disease Mother    Alzheimer's disease Father    Heart disease Sister    Diabetes type I Brother     Review of Systems: Review of Systems  Constitutional:  Positive for weight loss. Negative for chills and fever.  HENT:  Positive for sore throat.   Respiratory:  Negative for shortness of breath.   Cardiovascular:  Negative for chest pain.  Gastrointestinal:  Positive for constipation. Negative for abdominal pain, nausea and vomiting.  Genitourinary:  Negative for dysuria.  Musculoskeletal:  Negative for myalgias.  Skin:  Negative for rash.  Neurological:  Negative for dizziness.  Psychiatric/Behavioral:  Negative for depression.     Physical Exam BP 119/62   Pulse 89   Temp 98 F (36.7 C)   Ht 6\' 1"  (1.854  m)   Wt 156 lb (70.8 kg)   SpO2 97%   BMI 20.58 kg/m  CONSTITUTIONAL: No acute distress HEENT:  Normocephalic, atraumatic, extraocular motion intact. NECK: Tracheostomy in place, currently capped.  RESPIRATORY:  Lungs are clear, and breath sounds are equal bilaterally. Normal respiratory effort without pathologic use of accessory muscles. CARDIOVASCULAR: Heart is regular without murmurs, gallops, or rubs. GI: The abdomen is soft, scaphoid countour, non-tender to palpation. Has Dobhoff tube in place with suture securing to nose. MUSCULOSKELETAL:  Normal muscle strength and tone in all four extremities.  No peripheral edema or cyanosis. SKIN: Skin turgor is normal. There are no pathologic skin lesions.  NEUROLOGIC:  Motor and sensation is grossly normal.  Cranial nerves are grossly intact. PSYCH:  Alert and oriented to person, place and time. Affect is normal.  Laboratory Analysis: Labs from 05/04/23: Sodium  132, potassium 3.8, chloride 91, CO2 31, BUN 19, creatinine 0.52, glucose 328.  WBC 11.6, hemoglobin 13.4, hematocrit 29.2, platelets 248.  Imaging: PET scan on 04/06/23: IMPRESSION: Postsurgical changes involving the tongue and left neck.   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   No evidence of metastatic disease in the chest, abdomen, or pelvis.   Additional ancillary findings as above.  Assessment and Plan: This is a 67 y.o. male with a history of stage IV tongue cancer status post hemiglossectomy and neck dissection.  - Discussed with the patient that the Dobbhoff tube that he has unfortunately not a good long-term resource for tube feedings as the tube itself can get clogged easily, can become dislodged, and create ulcerations around the nostril.  As such, the main recommendation will be to place a more long-term gastrostomy tube that he can continue to use for tube feedings at home.  Patient is in agreement. - Discussed with the patient the  plan for an open gastrostomy tube placement.  Reviewed the surgery at length with him including the planned incision, risks of bleeding, infection, injury to surrounding structures, that this would be an outpatient procedure, that we will be able to remove his Dobbhoff tube at the same time, postoperative activity restrictions, pain control, and he is willing to proceed. - We will schedule the patient for surgery on 05/10/2023.  Although he has been holding some of his medications including his aspirin, recommended that he continue holding his aspirin to decrease the risk of bleeding for surgery.  Advised him also to get in touch with his PCP to see if any of his medications can be converted to liquid form or adjusted so that he can continue with appropriate medication management.  I spent 55 minutes dedicated to the care of this patient on the date of this encounter to include pre-visit review of records, face-to-face time with the patient discussing diagnosis and management, and any post-visit coordination of care.   Howie Ill, MD [facility]

## 2023-05-06 NOTE — Progress Notes (Signed)
05/06/2023  Reason for Visit:  Tongue cancer requiring feeding tube for nutrition  Requesting Provider:  Owens Shark, MD  History of Present Illness: Shane Perry is a 67 y.o. male with a history of Stage IV tongue cancer.  He had left hemiglossectomy with reconstruction and neck dissection and tracheostomy on 03/14/23 at [facility].  He currently has a Dobhoff tube for feedings.  He has recently started concurrent chemoradiation and he is in need for more long-term access for nutrition.  The patient reports that he is able to swallow liquids and yogurt consistency, but is unable to swallow solid food.  He is also starting to have more discomfort in his throat from radiation.  He is trying to drink Ensure shakes.  He also reports he is having difficulty with his daily medications, and he's trying to crush some of his medications to be able to take them.  He does report missing medications this week, particularly diabetic meds.    Past Medical History: Past Medical History:  Diagnosis Date   Cancer (HCC)    Diabetes mellitus without complication (HCC)    Hyperlipidemia    Hypertension    Kidney stones    Squamous cell carcinoma, tongue border (HCC)    Weight loss      Past Surgical History: Past Surgical History:  Procedure Laterality Date   COLONOSCOPY     COLONOSCOPY WITH PROPOFOL N/A 04/26/2023   Procedure: COLONOSCOPY WITH PROPOFOL;  Surgeon: [name], MD;  Location: [facility] ENDOSCOPY;  Service: Endoscopy;  Laterality: N/A;   CYST REMOVAL NECK     IR IMAGING GUIDED PORT INSERTION  04/14/2023   left arm muscle removal for tongue     partial tongue removal  03/14/2023   tongue cancer     TRACHEOSTOMY  03/14/2023    Home Medications: Prior to Admission medications   Medication Sig Start Date End Date Taking? Authorizing Provider  acetaminophen (TYLENOL) 160 MG/5ML solution Take 320 mg by mouth every 6 (six) hours as needed.   Yes [provider]  aspirin 81 MG  chewable tablet Chew 81 mg by mouth daily.   Yes [provider]  Blood Glucose Monitoring Suppl (ONETOUCH VERIO FLEX SYSTEM) w/Device KIT by Does not apply route. 10/14/22 10/14/23 Yes [provider]  glucose blood test strip  02/10/23 02/10/24 Yes [provider]  insulin regular (NOVOLIN R) 100 units/mL injection Inject into the skin 3 (three) times daily before meals.   Yes [provider]  lidocaine-prilocaine (EMLA) cream Apply to affected area once 03/29/23  Yes Creig Hines, MD  Nutritional Supplements (FEEDING SUPPLEMENT, OSMOLITE 1.5 CAL,) LIQD Give 1.5 cartons 4 times a day via feeding tube.  Flush with 60ml of water before and after each feeding. Provide additional 3 cups ( ) via feeding tube or orally to meet hydration needs.   Needs bolus supplies for tube feeding. 04/29/23  Yes Creig Hines, MD  dexamethasone (DECADRON) 4 MG tablet Take 2 tablets (8 mg) by mouth daily x 3 days starting the day after cisplatin chemotherapy. Take with food. Patient not taking: Reported on 04/26/2023 03/29/23   Creig Hines, MD  empagliflozin (JARDIANCE) 10 MG TABS tablet Take 1 tablet by mouth every morning. Patient not taking: Reported on 01/31/2023 01/30/21   [provider]  glipiZIDE (GLUCOTROL XL) 10 MG 24 hr tablet Take 10 mg by mouth 2 (two) times daily. Patient not taking: Reported on 04/26/2023 04/01/21   [provider]  losartan (COZAAR) 50 MG tablet Take 50 mg by mouth daily. Patient not taking: Reported on 04/27/2023 08/11/21   [provider]  metFORMIN (GLUCOPHAGE-XR) 500 MG 24 hr tablet Take 1,000 mg by mouth 2 (two) times daily. Patient not taking: Reported on 04/27/2023 03/19/21   [provider]  metoprolol succinate (TOPROL-XL) 50 MG 24 hr tablet TAKE 1 TABLET BY MOUTH ONCE DAILY WITH  OR  IMMEDIATELY  FOLLOWING  A  MEAL Patient not taking: Reported on 04/27/2023 05/13/22   Antonieta Iba, MD  Multiple Vitamin  (MULTIVITAMIN) tablet Take 1 tablet by mouth daily. Patient not taking: Reported on 04/27/2023    [provider]  ondansetron (ZOFRAN) 8 MG tablet Take 1 tablet (8 mg total) by mouth every 8 (eight) hours as needed for nausea or vomiting. Start on the third day after cisplatin. Patient not taking: Reported on 04/27/2023 03/29/23   Creig Hines, MD  prochlorperazine (COMPAZINE) 10 MG tablet Take 1 tablet (10 mg total) by mouth every 6 (six) hours as needed (Nausea or vomiting). Patient not taking: Reported on 04/27/2023 03/29/23   Creig Hines, MD    Allergies: Allergies  Allergen Reactions   Statins Other (See Comments)    Loss of balance   Varenicline Anxiety and Other (See Comments)    violent  Violent, agitation   Lisinopril Other (See Comments)    hoarseness    Social History:  reports that he has been smoking cigarettes. He has a 50 pack-year smoking history. He has been exposed to tobacco smoke. He has never used smokeless tobacco. He reports current alcohol use of about 1.0 standard drink of alcohol per week. He reports that he does not use drugs.   Family History: Family History  Problem Relation Age of Onset   Hypertension Mother    Alzheimer's disease Mother    Alzheimer's disease Father    Heart disease Sister    Diabetes type I Brother     Review of Systems: Review of Systems  Constitutional:  Positive for weight loss. Negative for chills and fever.  HENT:  Positive for sore throat.   Respiratory:  Negative for shortness of breath.   Cardiovascular:  Negative for chest pain.  Gastrointestinal:  Positive for constipation. Negative for abdominal pain, nausea and vomiting.  Genitourinary:  Negative for dysuria.  Musculoskeletal:  Negative for myalgias.  Skin:  Negative for rash.  Neurological:  Negative for dizziness.  Psychiatric/Behavioral:  Negative for depression.     Physical Exam BP 119/62   Pulse 89   Temp 98 F (36.7 C)   Ht 6\' 1"  (1.854  m)   Wt 156 lb (70.8 kg)   SpO2 97%   BMI 20.58 kg/m  CONSTITUTIONAL: No acute distress HEENT:  Normocephalic, atraumatic, extraocular motion intact. NECK: Tracheostomy in place, currently capped.  RESPIRATORY:  Lungs are clear, and breath sounds are equal bilaterally. Normal respiratory effort without pathologic use of accessory muscles. CARDIOVASCULAR: Heart is regular without murmurs, gallops, or rubs. GI: The abdomen is soft, scaphoid countour, non-tender to palpation. Has Dobhoff tube in place with suture securing to nose. MUSCULOSKELETAL:  Normal muscle strength and tone in all four extremities.  No peripheral edema or cyanosis. SKIN: Skin turgor is normal. There are no pathologic skin lesions.  NEUROLOGIC:  Motor and sensation is grossly normal.  Cranial nerves are grossly intact. PSYCH:  Alert and oriented to person, place and time. Affect is normal.  Laboratory Analysis: Labs from 05/04/23: Sodium  132, potassium 3.8, chloride 91, CO2 31, BUN 19, creatinine 0.52, glucose 328.  WBC 11.6, hemoglobin 13.4, hematocrit 29.2, platelets 248.  Imaging: PET scan on 04/06/23: IMPRESSION: Postsurgical changes involving the tongue and left neck.   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   No evidence of metastatic disease in the chest, abdomen, or pelvis.   Additional ancillary findings as above.  Assessment and Plan: This is a 67 y.o. male with a history of stage IV tongue cancer status post hemiglossectomy and neck dissection.  - Discussed with the patient that the Dobbhoff tube that he has unfortunately not a good long-term resource for tube feedings as the tube itself can get clogged easily, can become dislodged, and create ulcerations around the nostril.  As such, the main recommendation will be to place a more long-term gastrostomy tube that he can continue to use for tube feedings at home.  Patient is in agreement. - Discussed with the patient the  plan for an open gastrostomy tube placement.  Reviewed the surgery at length with him including the planned incision, risks of bleeding, infection, injury to surrounding structures, that this would be an outpatient procedure, that we will be able to remove his Dobbhoff tube at the same time, postoperative activity restrictions, pain control, and he is willing to proceed. - We will schedule the patient for surgery on 05/10/2023.  Although he has been holding some of his medications including his aspirin, recommended that he continue holding his aspirin to decrease the risk of bleeding for surgery.  Advised him also to get in touch with his PCP to see if any of his medications can be converted to liquid form or adjusted so that he can continue with appropriate medication management.  I spent 55 minutes dedicated to the care of this patient on the date of this encounter to include pre-visit review of records, face-to-face time with the patient discussing diagnosis and management, and any post-visit coordination of care.   Howie Ill, MD [facility]

## 2023-05-06 NOTE — Telephone Encounter (Signed)
Patient has been advised of Pre-Admission date/time, and Surgery date at [facility].  Surgery Date: 05/10/23 Preadmission Testing Date: 05/09/23 (phone 1p-4p)  Patient has been made aware to call [phone], between 1-3:00pm the day before surgery, to find out what time to arrive for surgery.

## 2023-05-06 NOTE — Patient Instructions (Signed)
You have requested to have a peg tube placement done. This will be done at [facility] with Dr. Aleen Campi.  Please see the (blue)pre-care form that you have been given today. Our surgery scheduler will call you to verify surgery date and to go over information.   If you have any questions, please call our office.  PEG Tube Home Guide A percutaneous endoscopic gastrostomy (PEG) tube is used to deliver food, medicine, and fluids directly into the stomach. The tube has a clamp, a cap, and two anchors (bolsters). One bolster keeps the tube from coming out of the stomach. The other bolster holds the tube against the abdomen. You will be taught how to use and adjust your PEG tube before you leave the hospital. You will also be taught how to care for the opening (stoma) in your abdomen. Make sure that you understand: How to care for your PEG tube. How to care for your stoma. How to give yourself feedings and medicines. When to call your health care provider for help. Supplies needed: Soapy water. Clean, plain water. Clean washcloth. Bandage (dressing). This is optional. Syringe. How to care for a PEG tube Check your PEG tube every day. Make sure: It is not too tight. The bolster should rest gently over the stoma. It is in the correct position. There is a mark on the tube that shows when it is in the correct position. Adjust the tube if you need to. Cleaning your stoma Clean your stoma every day. Follow these steps: Wash your hands with soap and water for at least 20 seconds. If soap and water are not available, use hand sanitizer. Check the skin around the stoma for redness, rash, swelling, drainage, or extra tissue growth. If you notice any of these, call your health care provider. Wash the stoma and the skin around it using a clean, soft washcloth. Clean using a circular motion, and wipe away from the stoma opening, not toward it. Use warm, soapy water, and only use cleansers recommended  by your health care provider. Rinse the stoma area with plain water. Pat the stoma area dry. Place a dressing over the stoma if your health care provider told you to do that.  Giving yourself a feeding Your health care provider will give you instructions about: How much nutrition and fluid you will need for each feeding. How often to have a feeding. Whether to take medicine in the tube by itself or with a feeding. To give yourself a feeding, follow these steps: Geanie Cooley out all of the equipment that you will need. Make sure that the nutritional formula is at room temperature. Wash your hands with soap and water for at least 20 seconds. If soap and water are not available, use hand sanitizer. Position yourself so that you are upright. You will need to stay upright throughout the feeding and for at least 30 minutes after the feeding. Make sure the syringe plunger is pushed in. Place the tip of the syringe in clean water, and slowly pull the plunger to bring (draw up) the water into the syringe. Remove the clamp and the cap from the PEG tube. Push the water out of the syringe to clean (flush) the tube. If the tube is clear, draw up the formula into the syringe. Make sure to use the right amount for each feeding and add water if necessary. Slowly push the formula from the syringe through the tube. After the feeding, flush the tube with water. Put the clamp  and the cap on the tube. Stay sitting up or standing up straight for at least 30 minutes. Use the feeding tube equipment, such as syringes and connectors, only as told by your health care provider. Giving yourself medicine To give yourself medicine, follow these steps: Lay out all of the equipment that you will need. If your medicine is in tablet form, crush the tablet and dissolve it in water. Wash your hands with soap and water for at least 20 seconds. If soap and water are not available, use hand sanitizer. Position yourself so that you are  upright. You will need to stay upright while you give yourself medicine and for at least 30 minutes afterward. Make sure the syringe plunger is pushed in. Place the tip of the syringe in clean water, and slowly pull the plunger to bring (draw up) the water into the syringe. Remove the clamp and the cap from the PEG tube. Push the water out of the syringe to clean (flush) the tube. If the tube is clear, draw up the medicine into the syringe. Slowly push the medicine from the syringe through the tube. Flush the tube with water. Put the clamp and the cap on the tube. Stay sitting up or standing up straight for at least 30 minutes. Do not take sustained release (SR) medicines through your tube. If you are unsure if your medicine is an SR medicine, ask your health care provider or pharmacist. Contact a health care provider if you have: Soreness, redness, or irritation around your stoma. Abdominal pain or bloating during or after your feedings. Nausea, constipation, or diarrhea that will not go away. A fever. Problems with your PEG tube. Get help right away if: Your tube is blocked. Your tube falls out. You have pain around your stoma. You are bleeding from your stoma. Your tube is leaking. You choke or you have trouble breathing during or after a feeding. Summary A percutaneous endoscopic gastrostomy (PEG) tube is used to deliver food and fluids directly into the stomach. You will be taught how to use and adjust your PEG tube. You will also be taught how to care for the stoma in your abdomen. Your health care provider will give you instructions on how to give yourself nutritional formula and medicines through your PEG tube. Contact your health care provider if you have a fever or soreness, redness, or irritation around your stoma. Get help right away if your tube leaks, is blocked, or falls out. Get help right away if you have pain or bleeding around your stoma. This information is not  intended to replace advice given to you by your health care provider. Make sure you discuss any questions you have with your health care provider. Document Revised: 06/25/2020 Document Reviewed: 11/16/2019 Elsevier Patient Education  2023 [employer].

## 2023-05-09 ENCOUNTER — Other Ambulatory Visit: Payer: Self-pay

## 2023-05-09 ENCOUNTER — Ambulatory Visit
Admission: RE | Admit: 2023-05-09 | Discharge: 2023-05-09 | Disposition: A | Discharge status: Home / Self Care | Payer: 59 | Source: Ambulatory Visit | Attending: Radiation Oncology | Admitting: Radiation Oncology

## 2023-05-09 ENCOUNTER — Encounter
Admission: RE | Admit: 2023-05-09 | Discharge: 2023-05-09 | Disposition: A | Discharge status: Home / Self Care | Payer: 59 | Source: Ambulatory Visit | Attending: Surgery | Admitting: Surgery

## 2023-05-09 VITALS — Ht 73.0 in | Wt 151.0 lb

## 2023-05-09 DIAGNOSIS — E1165 Type 2 diabetes mellitus with hyperglycemia: Secondary | ICD-10-CM

## 2023-05-09 DIAGNOSIS — Z01812 Encounter for preprocedural laboratory examination: Secondary | ICD-10-CM

## 2023-05-09 DIAGNOSIS — Z5111 Encounter for antineoplastic chemotherapy: Secondary | ICD-10-CM | POA: Diagnosis not present

## 2023-05-09 HISTORY — DX: Tinea unguium: B35.1

## 2023-05-09 HISTORY — DX: Malignant neoplasm of tongue, unspecified: C02.9

## 2023-05-09 HISTORY — DX: Personal history of urinary calculi: Z87.442

## 2023-05-09 HISTORY — DX: Type 2 diabetes mellitus with diabetic polyneuropathy: E11.42

## 2023-05-09 HISTORY — DX: Panlobular emphysema: J43.1

## 2023-05-09 LAB — RAD ONC ARIA SESSION SUMMARY
Course Elapsed Days: 11
Plan Fractions Treated to Date: 8
Plan Prescribed Dose Per Fraction: 2 Gy
Plan Total Fractions Prescribed: 35
Plan Total Prescribed Dose: 70 Gy
Reference Point Dosage Given to Date: 16 Gy
Reference Point Session Dosage Given: 2 Gy
Session Number: 8

## 2023-05-09 MED ORDER — CHLORHEXIDINE GLUCONATE CLOTH 2 % EX PADS: 6.0000 | MEDICATED_PAD | Freq: Once | CUTANEOUS | Status: DC

## 2023-05-09 MED ORDER — SODIUM CHLORIDE 0.9 % IV SOLN
2.0000 g | INTRAVENOUS | Status: AC
  Administered 2023-05-10 (×2): 2 g via INTRAVENOUS

## 2023-05-09 MED ORDER — CHLORHEXIDINE GLUCONATE 0.12 % MT SOLN
15.0000 mL | Freq: Once | OROMUCOSAL | Status: AC
  Administered 2023-05-10: 15 mL via OROMUCOSAL

## 2023-05-09 MED ORDER — BUPIVACAINE LIPOSOME 1.3 % IJ SUSP: 20.0000 mL | Freq: Once | INTRAMUSCULAR | Status: DC

## 2023-05-09 MED ORDER — SODIUM CHLORIDE 0.9 % IV SOLN: INTRAVENOUS | Status: DC

## 2023-05-09 MED ORDER — ORAL CARE MOUTH RINSE: 15.0000 mL | Freq: Once | OROMUCOSAL | Status: AC

## 2023-05-09 NOTE — Patient Instructions (Addendum)
Your procedure is scheduled MV:HQIONGE October 22 Report to the Registration Desk on the 1st floor of the [facility] To find out your arrival time, please call [phone] between 1PM - 3PM on: Monday October 21 If your arrival time is 6:00 am, do not arrive before that time as the [facility] entrance doors do not open until 6:00 am.  REMEMBER: Instructions that are not followed completely may result in serious medical risk, up to and including death; or upon the discretion of your surgeon and anesthesiologist your surgery may need to be rescheduled.  Do not eat food after midnight the night before surgery.  No gum chewing or hard candies.  You may however, drink WATER up to 2 hours before you are scheduled to arrive for your surgery. Do not drink anything within 2 hours of your scheduled arrival time.  One week prior to surgery: Stop Anti-inflammatories (NSAIDS) such as Advil, Aleve, Ibuprofen, Motrin, Naproxen, Naprosyn and Aspirin based products such as Excedrin, Goody's Powder, BC Powder. Stop ANY OVER THE COUNTER supplements until after surgery. Multiple Vitamin (MULTIVITAMIN)  You may however, continue to take Tylenol if needed for pain up until the day of surgery.  **Follow guidelines for insulin and diabetes medications.** insulin regular (NOVOLIN R)    the normal dose of insulin the night prior to surgery NO INSULIN MORNING OF SURGERY    Continue taking all of your other prescription medications up until the day of surgery.  ON THE DAY OF SURGERY ONLY TAKE THESE MEDICATIONS WITH SIPS OF WATER:  metoprolol succinate (TOPROL-XL)  buPROPion (WELLBUTRIN SR)    No Alcohol for 24 hours before or after surgery.  No Smoking including e-cigarettes for 24 hours before surgery.  No chewable tobacco products for at least 6 hours before surgery.  No nicotine patches on the day of surgery.  Do not use any "recreational" drugs for at least a week (preferably 2 weeks) before  your surgery.  Please be advised that the combination of cocaine and anesthesia may have negative outcomes, up to and including death. If you test positive for cocaine, your surgery will be cancelled.  On the morning of surgery brush your teeth with toothpaste and water, you may rinse your mouth with mouthwash if you wish. Do not swallow any toothpaste or mouthwash.  Use CHG Soap or wipes as directed on instruction sheet.  Do not wear jewelry, make-up, hairpins, clips or nail polish.  For welded (permanent) jewelry: bracelets, anklets, waist bands, etc.  Please have this removed prior to surgery.  If it is not removed, there is a chance that hospital personnel will need to cut it off on the day of surgery.  Do not wear lotions, powders, or perfumes.   Do not shave body hair from the neck down 48 hours before surgery.  Contact lenses, hearing aids and dentures may not be worn into surgery.  Do not bring valuables to the hospital. [facility] is not responsible for any missing/lost belongings or valuables.   Notify your doctor if there is any change in your medical condition (cold, fever, infection).  Wear comfortable clothing (specific to your surgery type) to the hospital.  After surgery, you can help prevent lung complications by doing breathing exercises.  Take deep breaths and cough every 1-2 hours.  If you are being admitted to the hospital overnight, leave your suitcase in the car. After surgery it may be brought to your room.  In case of increased patient census, it  may be necessary for you, the patient, to continue your postoperative care in the Same Day Surgery department.  If you are being discharged the day of surgery, you will not be allowed to drive home. You will need a responsible individual to drive you home and stay with you for 24 hours after surgery.   If you are taking public transportation, you will need to have a responsible individual with you.  Please call  the Pre-admissions Testing Dept. at [phone] if you have any questions about these instructions.  Surgery Visitation Policy:  Patients having surgery or a procedure may have two visitors.  Children under the age of 57 must have an adult with them who is not the patient.  Inpatient Visitation:    Visiting hours are 7 a.m. to 8 p.m. Up to four visitors are allowed at one time in a patient room. The visitors may rotate out with other people during the day.  One visitor age 48 or older may stay with the patient overnight and must be in the room by 8 p.m.          Preparing for Surgery with CHLORHEXIDINE GLUCONATE (CHG) Soap  Chlorhexidine Gluconate (CHG) Soap  o An antiseptic cleaner that kills germs and bonds with the skin to continue killing germs even after washing  o Used for showering the night before surgery and morning of surgery  Before surgery, you can play an important role by reducing the number of germs on your skin.  CHG (Chlorhexidine gluconate) soap is an antiseptic cleanser which kills germs and bonds with the skin to continue killing germs even after washing.  Please do not use if you have an allergy to CHG or antibacterial soaps. If your skin becomes reddened/irritated stop using the CHG.  1. Shower the NIGHT BEFORE SURGERY and the MORNING OF SURGERY with CHG soap.  2. If you choose to wash your hair, wash your hair first as usual with your normal shampoo.  3. After shampooing, rinse your hair and body thoroughly to remove the shampoo.  4. Use CHG as you would any other liquid soap. You can apply CHG directly to the skin and wash gently with a scrungie or a clean washcloth.  5. Apply the CHG soap to your body only from the neck down. Do not use on open wounds or open sores. Avoid contact with your eyes, ears, mouth, and genitals (private parts). Wash face and genitals (private parts) with your normal soap.  6. Wash thoroughly, paying special attention  to the area where your surgery will be performed.  7. Thoroughly rinse your body with warm water.  8. Do not shower/wash with your normal soap after using and rinsing off the CHG soap.  9. Pat yourself dry with a clean towel.  10. Wear clean pajamas to bed the night before surgery.  12. Place clean sheets on your bed the night of your first shower and do not sleep with pets.  13. Shower again with the CHG soap on the day of surgery prior to arriving at the hospital.  14. Do not apply any deodorants/lotions/powders.  15. Please wear clean clothes to the hospital.

## 2023-05-10 ENCOUNTER — Encounter: Payer: Self-pay | Admitting: Surgery

## 2023-05-10 ENCOUNTER — Other Ambulatory Visit: Payer: Self-pay

## 2023-05-10 ENCOUNTER — Ambulatory Visit: Payer: 59 | Admitting: Anesthesiology

## 2023-05-10 ENCOUNTER — Ambulatory Visit
Admission: RE | Admit: 2023-05-10 | Discharge: 2023-05-10 | Disposition: A | Discharge status: Home / Self Care | Payer: 59 | Attending: Surgery | Admitting: Surgery

## 2023-05-10 ENCOUNTER — Ambulatory Visit: Payer: 59

## 2023-05-10 ENCOUNTER — Encounter
Admission: RE | Disposition: A | Discharge status: Home / Self Care | Payer: Self-pay | Source: Home / Self Care | Attending: Surgery

## 2023-05-10 DIAGNOSIS — C799 Secondary malignant neoplasm of unspecified site: Secondary | ICD-10-CM | POA: Insufficient documentation

## 2023-05-10 DIAGNOSIS — E1165 Type 2 diabetes mellitus with hyperglycemia: Secondary | ICD-10-CM

## 2023-05-10 DIAGNOSIS — C021 Malignant neoplasm of border of tongue: Secondary | ICD-10-CM | POA: Insufficient documentation

## 2023-05-10 DIAGNOSIS — Z7984 Long term (current) use of oral hypoglycemic drugs: Secondary | ICD-10-CM | POA: Diagnosis not present

## 2023-05-10 DIAGNOSIS — Z930 Tracheostomy status: Secondary | ICD-10-CM | POA: Insufficient documentation

## 2023-05-10 DIAGNOSIS — Z9889 Other specified postprocedural states: Secondary | ICD-10-CM | POA: Diagnosis not present

## 2023-05-10 DIAGNOSIS — Z01812 Encounter for preprocedural laboratory examination: Secondary | ICD-10-CM

## 2023-05-10 DIAGNOSIS — Z794 Long term (current) use of insulin: Secondary | ICD-10-CM | POA: Insufficient documentation

## 2023-05-10 DIAGNOSIS — J431 Panlobular emphysema: Secondary | ICD-10-CM | POA: Diagnosis not present

## 2023-05-10 DIAGNOSIS — E1142 Type 2 diabetes mellitus with diabetic polyneuropathy: Secondary | ICD-10-CM | POA: Insufficient documentation

## 2023-05-10 DIAGNOSIS — E119 Type 2 diabetes mellitus without complications: Secondary | ICD-10-CM | POA: Insufficient documentation

## 2023-05-10 DIAGNOSIS — I10 Essential (primary) hypertension: Secondary | ICD-10-CM | POA: Diagnosis not present

## 2023-05-10 DIAGNOSIS — C029 Malignant neoplasm of tongue, unspecified: Secondary | ICD-10-CM

## 2023-05-10 DIAGNOSIS — F17210 Nicotine dependence, cigarettes, uncomplicated: Secondary | ICD-10-CM | POA: Insufficient documentation

## 2023-05-10 HISTORY — PX: GASTROSTOMY: SHX5249

## 2023-05-10 LAB — GLUCOSE, CAPILLARY
Glucose-Capillary: 222 mg/dL — ABNORMAL HIGH (ref 70–99)
Glucose-Capillary: 236 mg/dL — ABNORMAL HIGH (ref 70–99)
Glucose-Capillary: 181 mg/dL — ABNORMAL HIGH (ref 70–99)

## 2023-05-10 SURGERY — INSERTION OF GASTROSTOMY TUBE
Anesthesia: General | Site: Abdomen

## 2023-05-10 MED ORDER — 0.9 % SODIUM CHLORIDE (POUR BTL) OPTIME
TOPICAL | Status: DC | PRN
  Administered 2023-05-10: 500 mL

## 2023-05-10 MED ORDER — INSULIN ASPART 100 UNIT/ML IJ SOLN
INTRAMUSCULAR | Status: AC
Start: 2023-05-10 — End: ?
  Filled 2023-05-10: qty 1

## 2023-05-10 MED ORDER — MIDAZOLAM HCL 2 MG/2ML IJ SOLN
INTRAMUSCULAR | Status: AC
Start: 2023-05-10 — End: ?
  Filled 2023-05-10: qty 2

## 2023-05-10 MED ORDER — CHLORHEXIDINE GLUCONATE 0.12 % MT SOLN
OROMUCOSAL | Status: AC
  Filled 2023-05-10: qty 15

## 2023-05-10 MED ORDER — OXYCODONE HCL 5 MG PO TABS
5.0000 mg | ORAL_TABLET | Freq: Once | ORAL | Status: AC
  Administered 2023-05-10: 5 mg via ORAL

## 2023-05-10 MED ORDER — FENTANYL CITRATE (PF) 100 MCG/2ML IJ SOLN
INTRAMUSCULAR | Status: AC
  Filled 2023-05-10: qty 2

## 2023-05-10 MED ORDER — SUGAMMADEX SODIUM 200 MG/2ML IV SOLN
INTRAVENOUS | Status: DC | PRN
  Administered 2023-05-10: 200 mg via INTRAVENOUS

## 2023-05-10 MED ORDER — OXYCODONE HCL 5 MG PO TABS
ORAL_TABLET | ORAL | Status: AC
  Filled 2023-05-10: qty 1

## 2023-05-10 MED ORDER — DEXAMETHASONE SODIUM PHOSPHATE 10 MG/ML IJ SOLN
INTRAMUSCULAR | Status: AC
Start: 2023-05-10 — End: ?
  Filled 2023-05-10: qty 1

## 2023-05-10 MED ORDER — BUPIVACAINE LIPOSOME 1.3 % IJ SUSP
INTRAMUSCULAR | Status: AC
Start: 2023-05-10 — End: ?
  Filled 2023-05-10: qty 20

## 2023-05-10 MED ORDER — EPINEPHRINE PF 1 MG/ML IJ SOLN
INTRAMUSCULAR | Status: AC
  Filled 2023-05-10: qty 1

## 2023-05-10 MED ORDER — INSULIN ASPART 100 UNIT/ML IJ SOLN
5.0000 [IU] | Freq: Once | INTRAMUSCULAR | Status: AC
  Administered 2023-05-10: 5 [IU] via SUBCUTANEOUS

## 2023-05-10 MED ORDER — OXYCODONE HCL 5 MG/5ML PO SOLN
5.0000 mg | Freq: Four times a day (QID) | ORAL | 0 refills | Status: DC | PRN
Start: 2023-05-10 — End: 2023-06-13

## 2023-05-10 MED ORDER — MIDAZOLAM HCL 2 MG/2ML IJ SOLN
INTRAMUSCULAR | Status: DC | PRN
  Administered 2023-05-10: 1 mg via INTRAVENOUS

## 2023-05-10 MED ORDER — CEFOTETAN DISODIUM 2 G IJ SOLR
INTRAMUSCULAR | Status: AC
  Filled 2023-05-10: qty 2

## 2023-05-10 MED ORDER — DEXAMETHASONE SODIUM PHOSPHATE 10 MG/ML IJ SOLN
INTRAMUSCULAR | Status: DC | PRN
  Administered 2023-05-10: 5 mg via INTRAVENOUS

## 2023-05-10 MED ORDER — BUPIVACAINE-EPINEPHRINE (PF) 0.5% -1:200000 IJ SOLN
INTRAMUSCULAR | Status: DC | PRN
  Administered 2023-05-10: 50 mL via INTRAMUSCULAR

## 2023-05-10 MED ORDER — FENTANYL CITRATE (PF) 100 MCG/2ML IJ SOLN
INTRAMUSCULAR | Status: DC | PRN
  Administered 2023-05-10: 50 ug via INTRAVENOUS

## 2023-05-10 MED ORDER — ONDANSETRON HCL 4 MG/2ML IJ SOLN
INTRAMUSCULAR | Status: DC | PRN
  Administered 2023-05-10: 4 mg via INTRAVENOUS

## 2023-05-10 MED ORDER — BUPIVACAINE HCL (PF) 0.5 % IJ SOLN
INTRAMUSCULAR | Status: AC
Start: 2023-05-10 — End: ?
  Filled 2023-05-10: qty 30

## 2023-05-10 MED ORDER — ACETAMINOPHEN 10 MG/ML IV SOLN
INTRAVENOUS | Status: DC | PRN
  Administered 2023-05-10: 1000 mg via INTRAVENOUS

## 2023-05-10 MED ORDER — PROPOFOL 10 MG/ML IV BOLUS
INTRAVENOUS | Status: AC
Start: 2023-05-10 — End: ?
  Filled 2023-05-10: qty 20

## 2023-05-10 MED ORDER — PROPOFOL 10 MG/ML IV BOLUS
INTRAVENOUS | Status: DC | PRN
  Administered 2023-05-10: 70 mg via INTRAVENOUS

## 2023-05-10 MED ORDER — PHENYLEPHRINE 80 MCG/ML (10ML) SYRINGE FOR IV PUSH (FOR BLOOD PRESSURE SUPPORT)
PREFILLED_SYRINGE | INTRAVENOUS | Status: DC | PRN
  Administered 2023-05-10 (×2): 80 ug via INTRAVENOUS

## 2023-05-10 MED ORDER — PHENYLEPHRINE 80 MCG/ML (10ML) SYRINGE FOR IV PUSH (FOR BLOOD PRESSURE SUPPORT)
PREFILLED_SYRINGE | INTRAVENOUS | Status: AC
Start: 2023-05-10 — End: ?
  Filled 2023-05-10: qty 10

## 2023-05-10 MED ORDER — ACETAMINOPHEN 10 MG/ML IV SOLN
INTRAVENOUS | Status: AC
Start: 2023-05-10 — End: ?
  Filled 2023-05-10: qty 100

## 2023-05-10 MED ORDER — ROCURONIUM BROMIDE 100 MG/10ML IV SOLN
INTRAVENOUS | Status: DC | PRN
  Administered 2023-05-10: 30 mg via INTRAVENOUS
  Administered 2023-05-10: 20 mg via INTRAVENOUS
  Administered 2023-05-10: 10 mg via INTRAVENOUS

## 2023-05-10 MED ORDER — ONDANSETRON HCL 4 MG/2ML IJ SOLN
INTRAMUSCULAR | Status: AC
Start: 2023-05-10 — End: ?
  Filled 2023-05-10: qty 2

## 2023-05-10 MED ORDER — FENTANYL CITRATE (PF) 100 MCG/2ML IJ SOLN: 25.0000 ug | INTRAMUSCULAR | Status: DC | PRN

## 2023-05-10 MED ORDER — STERILE WATER FOR IRRIGATION IR SOLN
Status: DC | PRN
  Administered 2023-05-10: 500 mL

## 2023-05-10 MED FILL — Fosaprepitant Dimeglumine For IV Infusion 150 MG (Base Eq): INTRAVENOUS | Qty: 5 | Status: AC

## 2023-05-10 SURGICAL SUPPLY — 51 items
ADH SKN CLS APL DERMABOND .7 (GAUZE/BANDAGES/DRESSINGS) ×1
APL PRP STRL LF DISP 70% ISPRP (MISCELLANEOUS) ×1
CATH ROBINSON RED A/P 16FR (CATHETERS) ×1 IMPLANT
CHLORAPREP W/TINT 26 (MISCELLANEOUS) ×1 IMPLANT
DERMABOND ADVANCED .7 DNX12 (GAUZE/BANDAGES/DRESSINGS) ×1 IMPLANT
DRAPE LAPAROTOMY 100X77 ABD (DRAPES) ×1 IMPLANT
DRSG MEPILEX SACRM 8.7X9.8 (GAUZE/BANDAGES/DRESSINGS) IMPLANT
DRSG TEGADERM 4X4.75 (GAUZE/BANDAGES/DRESSINGS) ×1 IMPLANT
ELECT CAUTERY BLADE TIP 2.5 (TIP) ×1
ELECT REM PT RETURN 9FT ADLT (ELECTROSURGICAL) ×1
ELECTRODE CAUTERY BLDE TIP 2.5 (TIP) ×1 IMPLANT
ELECTRODE REM PT RTRN 9FT ADLT (ELECTROSURGICAL) ×1 IMPLANT
G-TUBE MIC 18FR ENFIT ADLT (TUBING) ×1 IMPLANT
G-TUBE MIC 22FR ENFIT ADLT (TUBING) IMPLANT
G-TUBE MIC ADLT 16FR ENFIT (TUBING) ×1 IMPLANT
GASTROSTOMY FEEDING TUBE IMPLANT
GAUZE 4X4 16PLY ~~LOC~~+RFID DBL (SPONGE) ×1 IMPLANT
GAUZE SPONGE 4X4 12PLY STRL (GAUZE/BANDAGES/DRESSINGS) ×1 IMPLANT
KIT TURNOVER KIT A (KITS) ×1 IMPLANT
MANIFOLD NEPTUNE II (INSTRUMENTS) ×1 IMPLANT
NDL HYPO 22X1.5 SAFETY MO (MISCELLANEOUS) ×1 IMPLANT
NEEDLE HYPO 22X1.5 SAFETY MO (MISCELLANEOUS) ×1 IMPLANT
NS IRRIG 500ML POUR BTL (IV SOLUTION) IMPLANT
PACK BASIN MAJOR ARMC (MISCELLANEOUS) ×1 IMPLANT
SPONGE T-LAP 18X18 ~~LOC~~+RFID (SPONGE) ×2 IMPLANT
SUT ETHILON 2 0 FS 18 (SUTURE) ×1 IMPLANT
SUT MNCRL 4-0 (SUTURE) ×1
SUT MNCRL 4-0 27XMFL (SUTURE) ×1
SUT PDS AB 1 CT1 27 (SUTURE) IMPLANT
SUT PDSII 8 18 CT-1 CR8 (SUTURE) ×2 IMPLANT
SUT PROLENE 2 0 SH DA (SUTURE) IMPLANT
SUT SILK 2 0 SH CR/8 (SUTURE) ×1 IMPLANT
SUT SILK 3-0 (SUTURE) ×3 IMPLANT
SUT VIC AB 3-0 SH 27 (SUTURE) ×1
SUT VIC AB 3-0 SH 27X BRD (SUTURE) ×1 IMPLANT
SUTURE MNCRL 4-0 27XMF (SUTURE) ×1 IMPLANT
SYR 10ML LL (SYRINGE) IMPLANT
SYR 20ML LL LF (SYRINGE) ×1 IMPLANT
SYR 30ML LL (SYRINGE) ×1 IMPLANT
SYR TOOMEY IRRIG 70ML (MISCELLANEOUS) ×1
SYRINGE TOOMEY IRRIG 70ML (MISCELLANEOUS) IMPLANT
TAPE CLOTH SURG 4X10 WHT LF (GAUZE/BANDAGES/DRESSINGS) IMPLANT
TRAP FLUID SMOKE EVACUATOR (MISCELLANEOUS) ×1 IMPLANT
TUBE GASTRO 14FR ENFIT (TUBING)
TUBE GASTRO 16FR ENFIT (TUBING)
TUBE GASTRO 18FR ENFIT (TUBING) IMPLANT
TUBE GASTRO 22FR ENFIT (TUBING) ×1 IMPLANT
TUBE GSTRM 1 16FR INTNL ENFIT (TUBING) IMPLANT
TUBE GSTRM 10.5X14FR YPRT (TUBING) ×1 IMPLANT
WATER STERILE IRR 1000ML POUR (IV SOLUTION) ×1 IMPLANT
WATER STERILE IRR 500ML POUR (IV SOLUTION) ×1 IMPLANT

## 2023-05-10 NOTE — Discharge Instructions (Addendum)
Discharge Instructions: 1.  Patient may shower, but do not scrub wounds heavily and dab dry only. 2.  Do not submerge wounds in pool/tub until fully healed. 3.  Do not apply ointments or hydrogen peroxide to the wounds. 4.  May apply ice packs to the wounds for comfort. 5.  Please change dressing around feeding tube either daily or every other day with gauze and tape.  The gauze will need to be cut halfway to make a slit to go around the tube. 6.  The silicone disc around the tube should be cinched down to the skin at all times.  After surgery, the top of the disc is lined with the 5 cm mark on the tube.  Please reference picture in your chart as needed.  If the tube/disc is too lose, may allow contents from the stomach to leak a bit. 7.  May start using the tube for feeding on 05/11/23. 8.  Do not drive while taking narcotics for pain control.  Prior to driving, make sure you are able to rotate right and left to look at blindspots without significant pain or discomfort. 9.  No heavy lifting or pushing of more than 10-15 lbs for 6 weeks. AMBULATORY SURGERY  DISCHARGE INSTRUCTIONS   The drugs that you were given will stay in your system until tomorrow so for the next 24 hours you should not:  Drive an automobile Make any legal decisions Drink any alcoholic beverage   You may resume regular meals tomorrow.  Today it is better to start with liquids and gradually work up to solid foods.  You may eat anything you prefer, but it is better to start with liquids, then soup and crackers, and gradually work up to solid foods.   Please notify your doctor immediately if you have any unusual bleeding, trouble breathing, redness and pain at the surgery site, drainage, fever, or pain not relieved by medication.  Your post-operative visit with Dr.                                     is: Date:                        Time:    Please call to schedule your post-operative visit.  Additional  Instructions:

## 2023-05-10 NOTE — Anesthesia Preprocedure Evaluation (Addendum)
Anesthesia Evaluation  Patient identified by MRN, date of birth, ID band Patient awake    Reviewed: Allergy & Precautions, NPO status , Patient's Chart, lab work & pertinent test results  History of Anesthesia Complications Negative for: history of anesthetic complications  Airway Mallampati: III  TM Distance: >3 FB Neck ROM: full    Dental  (+) Chipped, Poor Dentition, Missing   Pulmonary COPD, Current Smoker   breath sounds clear to auscultation       Cardiovascular hypertension, Normal cardiovascular exam     Neuro/Psych  PSYCHIATRIC DISORDERS       Neuromuscular disease    GI/Hepatic negative GI ROS, Neg liver ROS,,,  Endo/Other  diabetes, Type 2    Renal/GU Renal disease     Musculoskeletal   Abdominal   Peds  Hematology negative hematology ROS (+)   Anesthesia Other Findings Past Medical History: No date: Cancer (HCC) No date: Diabetes mellitus without complication (HCC) No date: Diabetic polyneuropathy associated with type 2 diabetes  mellitus (HCC) No date: History of kidney stones No date: Hyperlipidemia No date: Hypertension No date: Kidney stones No date: Onychomycosis No date: Panlobular emphysema (HCC) No date: Squamous cell carcinoma, tongue border (HCC) No date: Tongue cancer (HCC) No date: Weight loss  Past Surgical History: No date: COLONOSCOPY 04/26/2023: COLONOSCOPY WITH PROPOFOL; N/A     Comment:  Procedure: COLONOSCOPY WITH PROPOFOL;  Surgeon: [name], MD;  Location: [facility] ENDOSCOPY;  Service:               Endoscopy;  Laterality: N/A; No date: CYST REMOVAL NECK 04/14/2023: IR IMAGING GUIDED PORT INSERTION No date: left arm muscle removal for tongue 03/14/2023: partial tongue removal No date: PORTA CATH INSERTION; Right No date: tongue cancer 03/14/2023: TRACHEOSTOMY     Reproductive/Obstetrics negative OB ROS                              Anesthesia Physical Anesthesia Plan  ASA: 3  Anesthesia Plan: General   Post-op Pain Management:    Induction: Intravenous  PONV Risk Score and Plan: Ondansetron, Dexamethasone, Midazolam and Treatment may vary due to age or medical condition  Airway Management Planned: Tracheostomy  Additional Equipment:   Intra-op Plan:   Post-operative Plan: Extubation in OR  Informed Consent: I have reviewed the patients History and Physical, chart, labs and discussed the procedure including the risks, benefits and alternatives for the proposed anesthesia with the patient or authorized representative who has indicated his/her understanding and acceptance.     Dental Advisory Given  Plan Discussed with: Anesthesiologist, CRNA and Surgeon  Anesthesia Plan Comments: (Cuffed trach placed by Dr. Andee Poles in preOp.  Patient consented for risks of anesthesia including but not limited to:  - adverse reactions to medications - damage to eyes, teeth, lips or other oral mucosa - nerve damage due to positioning  - sore throat or hoarseness - Damage to heart, brain, nerves, lungs, other parts of body or loss of life  Patient voiced understanding and assent.)       Anesthesia Quick Evaluation

## 2023-05-10 NOTE — Anesthesia Postprocedure Evaluation (Signed)
Anesthesia Post Note  Patient: Shane Perry  Procedure(s) Performed: INSERTION OF GASTROSTOMY TUBE, open (Abdomen)  Patient location during evaluation: PACU Anesthesia Type: General Level of consciousness: awake and alert Pain management: pain level controlled Vital Signs Assessment: post-procedure vital signs reviewed and stable Respiratory status: spontaneous breathing, nonlabored ventilation, respiratory function stable and patient connected to nasal cannula oxygen Cardiovascular status: blood pressure returned to baseline and stable Postop Assessment: no apparent nausea or vomiting Anesthetic complications: no Comments: Patients cuff less trach was replaced in the PACU   No notable events documented.   Last Vitals:  Vitals:   05/10/23 1319 05/10/23 1330  BP:    Pulse: 74   Resp: 18   Temp:  (!) 36.4 C  SpO2: 98%     Last Pain:  Vitals:   05/10/23 1319  TempSrc:   PainSc: 3                  Cleda Mccreedy Dynisha Due

## 2023-05-10 NOTE — Transfer of Care (Signed)
Immediate Anesthesia Transfer of Care Note  Patient: Shane Perry  Procedure(s) Performed: INSERTION OF GASTROSTOMY TUBE, open (Abdomen)  Patient Location: PACU  Anesthesia Type:General  Level of Consciousness: drowsy  Airway & Oxygen Therapy: Patient Spontanous Breathing and Patient connected to tracheostomy mask oxygen  Post-op Assessment: Report given to RN and Post -op Vital signs reviewed and stable  Post vital signs: Reviewed and stable  Last Vitals:  Vitals Value Taken Time  BP 139/65 05/10/23 1223  Temp    Pulse 68 05/10/23 1228  Resp 16 05/10/23 1228  SpO2 100 % 05/10/23 1228  Vitals shown include unfiled device data.  Last Pain:  Vitals:   05/10/23 0908  TempSrc: Temporal  PainSc: 3       Patients Stated Pain Goal: 0 (05/10/23 0908)  Complications: No notable events documented.

## 2023-05-10 NOTE — Progress Notes (Signed)
Patient #6 cuff'd shiley in place for procedure.  Changed back to cuff less #6 shiley while in pacu, tolerated without event, Dr. Randa Ngo : anesthesia at bedside.  Sats on room air 99%  no resp distress noted. Dr. Aleen Campi spoke at length with patient regarding care of g-tube, supplies given.  Shane Perry verbalizes understanding.

## 2023-05-10 NOTE — Interval H&P Note (Signed)
History and Physical Interval Note:  05/10/2023 10:12 AM  Shane Perry  has presented today for surgery, with the diagnosis of tongue cancer.  The various methods of treatment have been discussed with the patient and family. After consideration of risks, benefits and other options for treatment, the patient has consented to  Procedure(s): INSERTION OF GASTROSTOMY TUBE, open (N/A) as a surgical intervention.  The patient's history has been reviewed, patient examined, no change in status, stable for surgery.  I have reviewed the patient's chart and labs.  Questions were answered to the patient's satisfaction.     [name]

## 2023-05-10 NOTE — Op Note (Addendum)
  Procedure Date:  05/10/2023  Pre-operative Diagnosis:  Metastatic tongue cancer, requiring long-term tube feed access  Post-operative Diagnosis: Metastatic tongue cancer, requiring long-term tube feed access  Procedure:  Open gastrostomy tube placement  Surgeon:  Howie Ill, MD  Anesthesia:  General endotracheal  Estimated Blood Loss:  10 ml  Specimens:  None  Complications:  None  Indications for Procedure:  This is a 67 y.o. male who presents with metastatic tongue cancer requiring long-term tube feed access for nutrition.  He already has a Dobhoff tube in place but needs something more long-term.  The risks of bleeding, abscess or infection, injury to surrounding structures, and need for further procedures were all discussed with the patient and was willing to proceed.  Description of Procedure: The patient was correctly identified in the preoperative area and brought into the operating room.  The patient was placed supine with VTE prophylaxis in place.  Appropriate time-outs were performed.  Anesthesia was induced and the patient's tracheostomy was connected to mechanical ventilation.  The abdomen was prepped and draped in a sterile fashion.  A epigastric midline incision was made and electrocautery was used to dissect down the subcutaneous tissue to the fascia.  The fascia was incised and extended superiorly and inferiorly.  The stomach was pulled up to the midline and an appropriate spot within the body of the stomach was located for our tube.  The corresponding location at the skin was incised and a 22 Fr gastrostomy tube was inserted through the abdominal wall.  Two concentric 2-0 Silk purse string sutures were placed and a gastrotomy was created and the tube was passed through.  The balloon was insufflated and was cinching appropriately.  The two sutures were tied.  The tube with the stomach were pulled flush with the abdominal wall and 4 silk sutures were placed to tack the  stomach to the abdominal wall.  The tube was tested and flushed appropriately with sterile water.  The balloon was inflated with 9 ml of water.  Exparel solution mixed with 0.5% bupivacaine with epi was infiltrated over the peritoneum, fascia, and subcutaneous tissue.  The fascia was then closed using #1 PDS suture.  The midline wound was irrigated and closed with 3-0 Vicryl and 4-0 Monocryl.  The wound was sealed with DermaBond.  The tube was secured using 2-0 Prolene suture and dressed with 4x4 gauze and tape.   The patient was emerged from anesthesia and extubated and brought to the recovery room for further management.  The patient tolerated the procedure well and all counts were correct at the end of the case.   Howie Ill, MD

## 2023-05-11 ENCOUNTER — Inpatient Hospital Stay: Payer: 59

## 2023-05-11 ENCOUNTER — Encounter: Payer: Self-pay | Admitting: Surgery

## 2023-05-11 ENCOUNTER — Ambulatory Visit: Payer: 59

## 2023-05-11 NOTE — Addendum Note (Signed)
Encounter addended by: Edward Qualia on: 05/11/2023 4:16 PM  Actions taken: Imaging Exam ended

## 2023-05-11 NOTE — Progress Notes (Signed)
Nutrition Follow-up:  Patient with SCC of oral cavity/oral tongue, stage IV.  S/p glossectomy with flap reconstruction at [facility].  Dobhoff tube placed on 03/14/23 at [facility].  Open G-tube performed yesterday at [facility] and Dobhoff removed.  Patient receiving radiation and chemotherapy.  Unable to come to appointments today due to pain from procedure yesterday.   Spoke with patient via phone.  Says that he was not able to pick up pain medication yesterday after procedure. He was having pain this am and unable to come to appointments today.  His brother was able to get his pain medication today.  He was instructed to start using feeding tube tomorrow.  He has received a shipment of formula, gauze, enfit syringes and tape from [facility].  He is eating small amounts of soft/puree foods.  Using suction machine.      INTERVENTION:  Reviewed how to give bolus tube feeding with G-tube again over the phone. Patient verbalized understanding.      MONITORING, EVALUATION, GOAL: weight trends, intake, tube feeding   NEXT VISIT: Wednesday, Oct 30 during infusion  Ki Corbo B. Freida Busman, RD, LDN Registered Dietitian [phone]

## 2023-05-12 ENCOUNTER — Other Ambulatory Visit: Payer: Self-pay

## 2023-05-12 ENCOUNTER — Ambulatory Visit
Admission: RE | Admit: 2023-05-12 | Discharge: 2023-05-12 | Disposition: A | Discharge status: Home / Self Care | Payer: 59 | Source: Ambulatory Visit | Attending: Radiation Oncology | Admitting: Radiation Oncology

## 2023-05-12 DIAGNOSIS — Z5111 Encounter for antineoplastic chemotherapy: Secondary | ICD-10-CM | POA: Diagnosis not present

## 2023-05-12 LAB — RAD ONC ARIA SESSION SUMMARY
Course Elapsed Days: 14
Plan Fractions Treated to Date: 9
Plan Prescribed Dose Per Fraction: 2 Gy
Plan Total Fractions Prescribed: 35
Plan Total Prescribed Dose: 70 Gy
Reference Point Dosage Given to Date: 18 Gy
Reference Point Session Dosage Given: 2 Gy
Session Number: 9

## 2023-05-13 ENCOUNTER — Ambulatory Visit
Admission: RE | Admit: 2023-05-13 | Discharge: 2023-05-13 | Disposition: A | Discharge status: Home / Self Care | Payer: 59 | Source: Ambulatory Visit | Attending: Radiation Oncology | Admitting: Radiation Oncology

## 2023-05-13 ENCOUNTER — Other Ambulatory Visit: Payer: Self-pay

## 2023-05-13 DIAGNOSIS — Z5111 Encounter for antineoplastic chemotherapy: Secondary | ICD-10-CM | POA: Diagnosis not present

## 2023-05-13 LAB — RAD ONC ARIA SESSION SUMMARY
Course Elapsed Days: 15
Plan Fractions Treated to Date: 10
Plan Prescribed Dose Per Fraction: 2 Gy
Plan Total Fractions Prescribed: 35
Plan Total Prescribed Dose: 70 Gy
Reference Point Dosage Given to Date: 20 Gy
Reference Point Session Dosage Given: 2 Gy
Session Number: 10

## 2023-05-14 ENCOUNTER — Other Ambulatory Visit: Payer: Self-pay

## 2023-05-16 ENCOUNTER — Other Ambulatory Visit: Payer: Self-pay

## 2023-05-16 ENCOUNTER — Ambulatory Visit
Admission: RE | Admit: 2023-05-16 | Discharge: 2023-05-16 | Disposition: A | Discharge status: Home / Self Care | Payer: 59 | Source: Ambulatory Visit | Attending: Radiation Oncology | Admitting: Radiation Oncology

## 2023-05-16 DIAGNOSIS — Z5111 Encounter for antineoplastic chemotherapy: Secondary | ICD-10-CM | POA: Diagnosis not present

## 2023-05-16 LAB — RAD ONC ARIA SESSION SUMMARY
Course Elapsed Days: 18
Plan Fractions Treated to Date: 11
Plan Prescribed Dose Per Fraction: 2 Gy
Plan Total Fractions Prescribed: 35
Plan Total Prescribed Dose: 70 Gy
Reference Point Dosage Given to Date: 22 Gy
Reference Point Session Dosage Given: 2 Gy
Session Number: 11

## 2023-05-16 IMAGING — CT CT CHEST LUNG CANCER SCREENING LOW DOSE W/O CM
1 of 2 series · 14 of 32 positions shown, 18 images · non-contrast
Comparison: No priors.

CLINICAL DATA: 64-year-old male current smoker with 39 pack-year
history of smoking. Lung cancer screening examination.

EXAM:
CT CHEST WITHOUT CONTRAST LOW-DOSE FOR LUNG CANCER SCREENING
TECHNIQUE: Multidetector CT imaging of the chest was performed following the
standard protocol without IV contrast.

[Series 3: ldct screen lung · axial · 0.83mm/px · z∈[-378,-66]mm · 14 of 344 slices shown, 18 images]
[im 16/344  mediastinal]
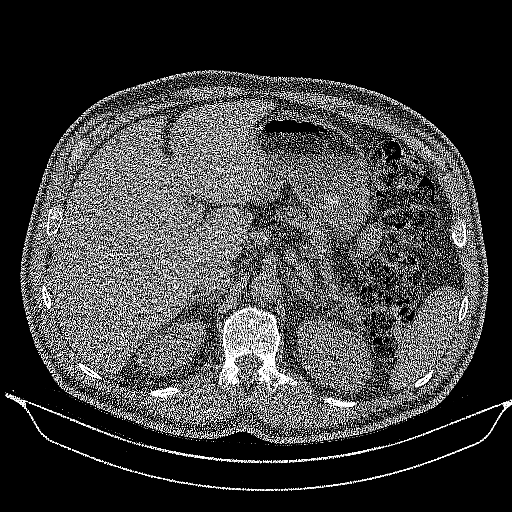
[im 16/344  lung]
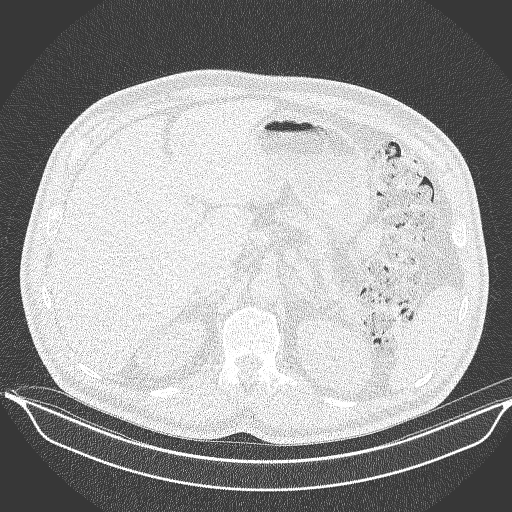
[im 47/344  lung]
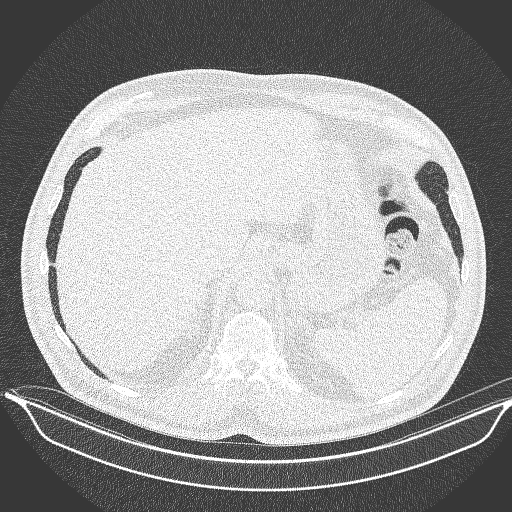
[im 78/344  lung]
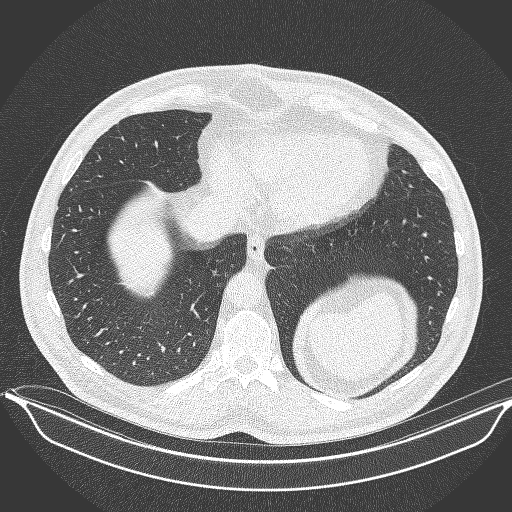
[im 94/344  lung]
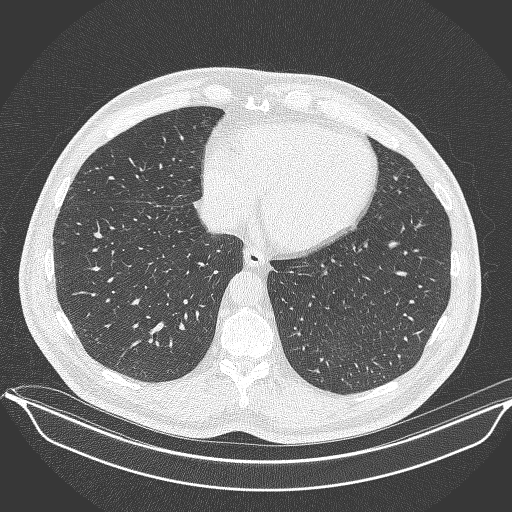
[im 110/344  mediastinal]
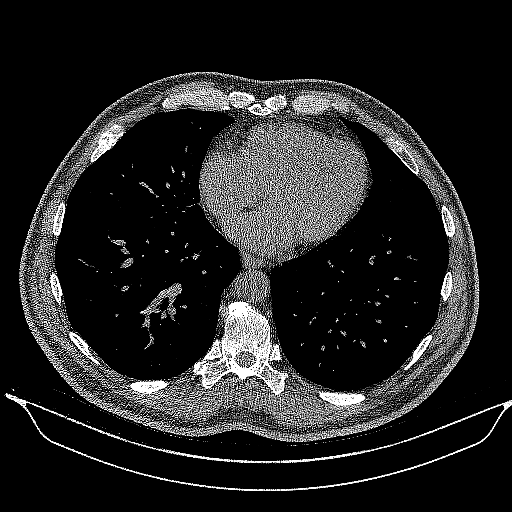
[im 110/344  lung]
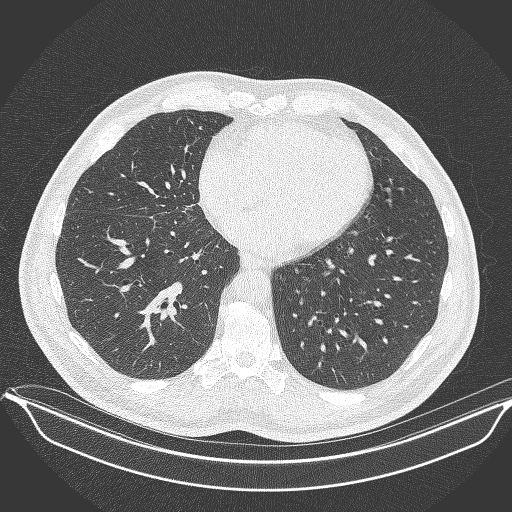
[im 141/344  lung]
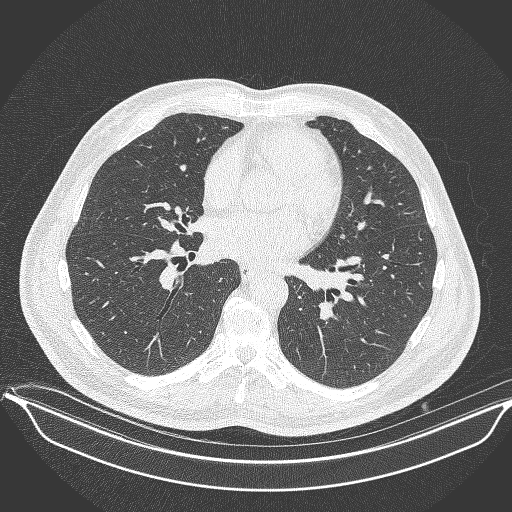
[im 162/344  lung]
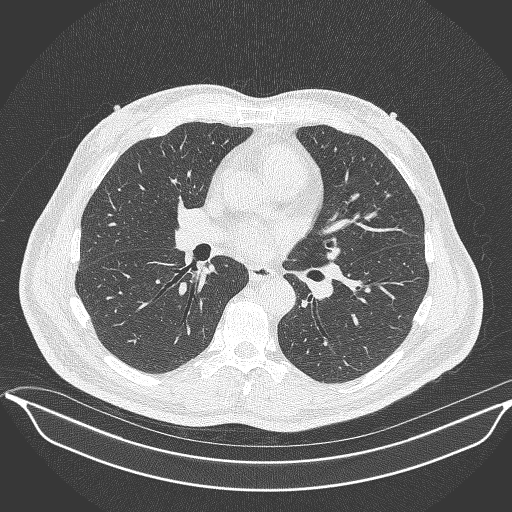
[im 172/344  lung]
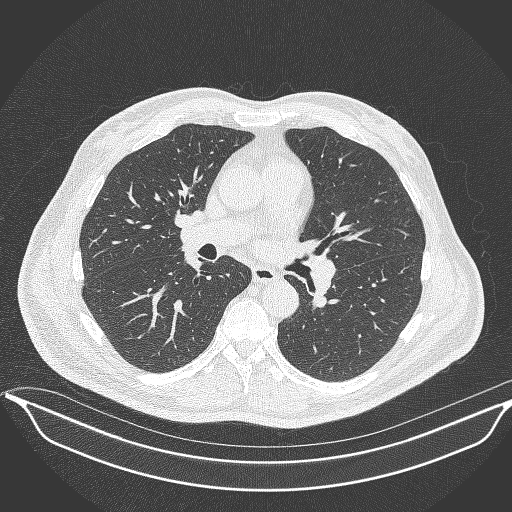
[im 203/344  mediastinal]
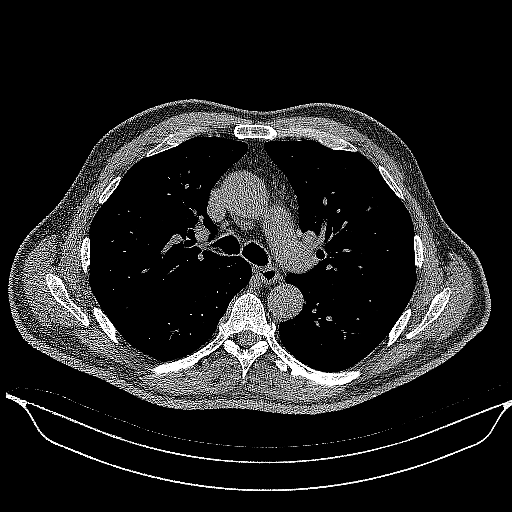
[im 203/344  lung]
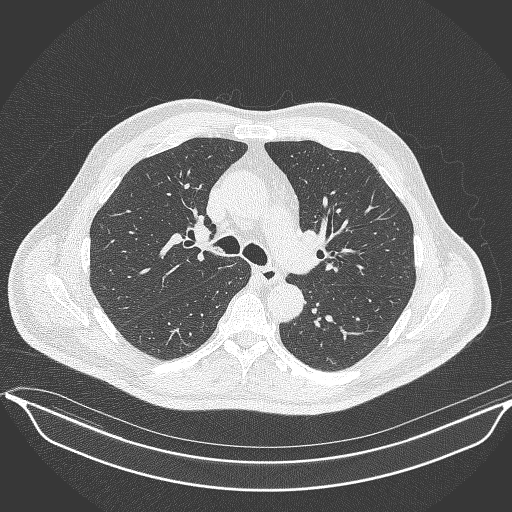
[im 234/344  lung]
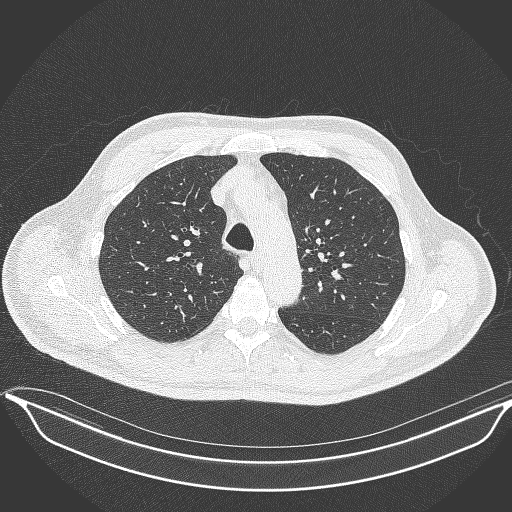
[im 258/344  lung]
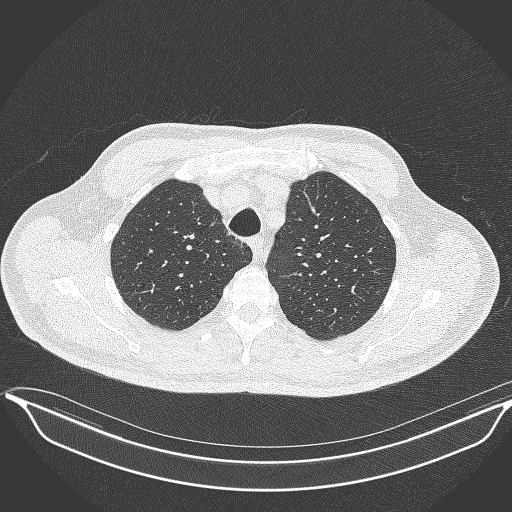
[im 266/344  lung]
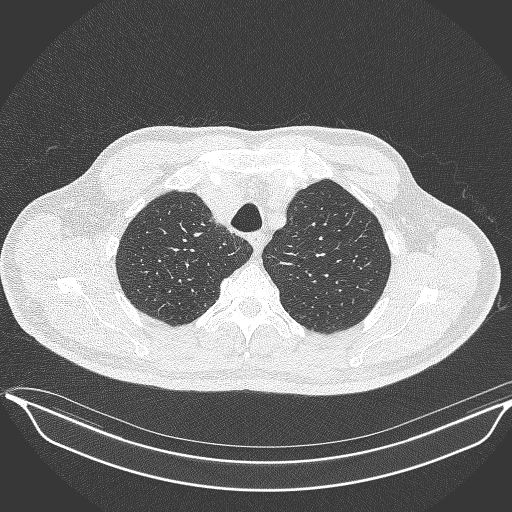
[im 297/344  mediastinal]
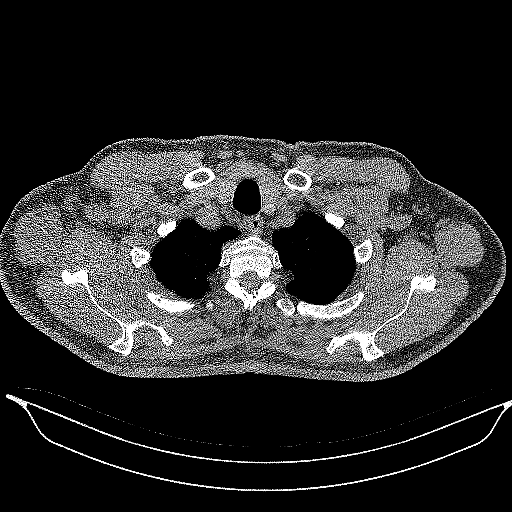
[im 297/344  lung]
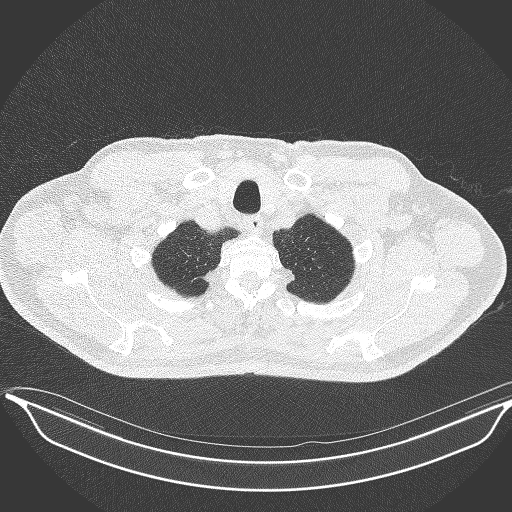
[im 328/344  lung]
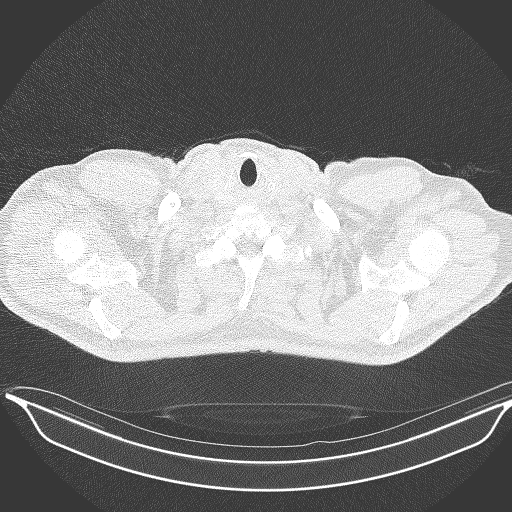

[14 of 32 positions shown; findings below may reference images not displayed]

FINDINGS: Cardiovascular: Heart size is normal. There is no significant
pericardial fluid, thickening or pericardial calcification. There is
aortic atherosclerosis, as well as atherosclerosis of the great
vessels of the mediastinum and the coronary arteries, including
calcified atherosclerotic plaque in the left circumflex coronary
artery.

Mediastinum/Nodes: No pathologically enlarged mediastinal or hilar
lymph nodes. Please note that accurate exclusion of hilar adenopathy
is limited on noncontrast CT scans. Esophagus is unremarkable in
appearance. No axillary lymphadenopathy.

Lungs/Pleura: Small pulmonary nodules are noted in the lungs,
largest of which is in the periphery of the lateral segment of the
right middle lobe (axial image 242 of series 3), with a volume
derived mean diameter of 4.3 mm. No other larger more suspicious
appearing pulmonary nodules or masses are noted. No acute
consolidative airspace disease. No pleural effusions. Mild diffuse
bronchial wall thickening with mild centrilobular and paraseptal
emphysema.

Upper Abdomen: Aortic atherosclerosis.

Musculoskeletal: Healing nondisplaced fracture of the anterior
aspect of the right sixth rib. There are no aggressive appearing
lytic or blastic lesions noted in the visualized portions of the
skeleton.
IMPRESSION: 1. Lung-RADS 2S, benign appearance or behavior. Continue annual
screening with low-dose chest CT without contrast in 12 months.
2. The "S" modifier above refers to potentially clinically
significant non lung cancer related findings. Specifically, there is
aortic atherosclerosis, in addition to left circumflex coronary
artery disease. Please note that although the presence of coronary
artery calcium documents the presence of coronary artery disease,
the severity of this disease and any potential stenosis cannot be
assessed on this non-gated CT examination. Assessment for potential
risk factor modification, dietary therapy or pharmacologic therapy
may be warranted, if clinically indicated.
3. Mild diffuse bronchial wall thickening with mild centrilobular
and paraseptal emphysema; imaging findings suggestive of underlying
COPD.

Aortic Atherosclerosis (XMN21-87M.M) and Emphysema ([id number].Q).

## 2023-05-17 ENCOUNTER — Ambulatory Visit: Payer: 59

## 2023-05-17 ENCOUNTER — Other Ambulatory Visit: Payer: Self-pay

## 2023-05-17 DIAGNOSIS — Z5111 Encounter for antineoplastic chemotherapy: Secondary | ICD-10-CM | POA: Diagnosis not present

## 2023-05-17 LAB — RAD ONC ARIA SESSION SUMMARY
Course Elapsed Days: 19
Plan Fractions Treated to Date: 12
Plan Prescribed Dose Per Fraction: 2 Gy
Plan Total Fractions Prescribed: 35
Plan Total Prescribed Dose: 70 Gy
Reference Point Dosage Given to Date: 24 Gy
Reference Point Session Dosage Given: 2 Gy
Session Number: 12

## 2023-05-17 MED FILL — Fosaprepitant Dimeglumine For IV Infusion 150 MG (Base Eq): INTRAVENOUS | Qty: 5 | Status: AC

## 2023-05-18 ENCOUNTER — Inpatient Hospital Stay: Payer: 59 | Admitting: Oncology

## 2023-05-18 ENCOUNTER — Ambulatory Visit
Admission: RE | Admit: 2023-05-18 | Discharge: 2023-05-18 | Disposition: A | Discharge status: Home / Self Care | Payer: 59 | Source: Ambulatory Visit | Attending: Radiation Oncology | Admitting: Radiation Oncology

## 2023-05-18 ENCOUNTER — Inpatient Hospital Stay: Payer: 59

## 2023-05-18 ENCOUNTER — Encounter: Payer: Self-pay | Admitting: Oncology

## 2023-05-18 ENCOUNTER — Other Ambulatory Visit: Payer: Self-pay

## 2023-05-18 ENCOUNTER — Other Ambulatory Visit: Payer: Self-pay | Admitting: Oncology

## 2023-05-18 VITALS — BP 98/62 | HR 83 | Temp 96.0°F | Resp 18 | Ht 73.0 in | Wt 154.3 lb

## 2023-05-18 VITALS — BP 135/78 | HR 81

## 2023-05-18 DIAGNOSIS — T380X5A Adverse effect of glucocorticoids and synthetic analogues, initial encounter: Secondary | ICD-10-CM | POA: Diagnosis not present

## 2023-05-18 DIAGNOSIS — C029 Malignant neoplasm of tongue, unspecified: Secondary | ICD-10-CM

## 2023-05-18 DIAGNOSIS — Z5111 Encounter for antineoplastic chemotherapy: Secondary | ICD-10-CM

## 2023-05-18 DIAGNOSIS — R739 Hyperglycemia, unspecified: Secondary | ICD-10-CM

## 2023-05-18 LAB — BASIC METABOLIC PANEL - CANCER CENTER ONLY
Anion gap: 7 (ref 5–15)
BUN: 24 mg/dL — ABNORMAL HIGH (ref 8–23)
CO2: 30 mmol/L (ref 22–32)
Calcium: 8.8 mg/dL — ABNORMAL LOW (ref 8.9–10.3)
Chloride: 87 mmol/L — ABNORMAL LOW (ref 98–111)
Creatinine: 0.53 mg/dL — ABNORMAL LOW (ref 0.61–1.24)
GFR, Estimated: >60 mL/min (ref >60)
Glucose, Bld: 474 mg/dL — ABNORMAL HIGH (ref 70–99)
Potassium: 4.8 mmol/L (ref 3.5–5.1)
Sodium: 124 mmol/L — ABNORMAL LOW (ref 135–145)

## 2023-05-18 LAB — RAD ONC ARIA SESSION SUMMARY
Course Elapsed Days: 20
Plan Fractions Treated to Date: 13
Plan Prescribed Dose Per Fraction: 2 Gy
Plan Total Fractions Prescribed: 35
Plan Total Prescribed Dose: 70 Gy
Reference Point Dosage Given to Date: 26 Gy
Reference Point Session Dosage Given: 2 Gy
Session Number: 13

## 2023-05-18 LAB — CBC WITH DIFFERENTIAL (CANCER CENTER ONLY)
Abs Immature Granulocytes: 0.03 10*3/uL (ref 0.00–0.07)
Basophils Absolute: 0 10*3/uL (ref 0.0–0.1)
Basophils Relative: 0 %
Eosinophils Absolute: 0 10*3/uL (ref 0.0–0.5)
Eosinophils Relative: 0 %
HCT: 35.9 % — ABNORMAL LOW (ref 39.0–52.0)
Hemoglobin: 12.3 g/dL — ABNORMAL LOW (ref 13.0–17.0)
Immature Granulocytes: 1 %
Lymphocytes Relative: 4 %
Lymphs Abs: 0.2 10*3/uL — ABNORMAL LOW (ref 0.7–4.0)
MCH: 30.9 pg (ref 26.0–34.0)
MCHC: 34.3 g/dL (ref 30.0–36.0)
MCV: 90.2 fL (ref 80.0–100.0)
Monocytes Absolute: 0.1 10*3/uL (ref 0.1–1.0)
Monocytes Relative: 2 %
Neutro Abs: 5 10*3/uL (ref 1.7–7.7)
Neutrophils Relative %: 93 %
Platelet Count: 191 10*3/uL (ref 150–400)
RBC: 3.98 MIL/uL — ABNORMAL LOW (ref 4.22–5.81)
RDW: 12.9 % (ref 11.5–15.5)
WBC Count: 5.4 10*3/uL (ref 4.0–10.5)
nRBC: 0 % (ref 0.0–0.2)

## 2023-05-18 LAB — MAGNESIUM: Magnesium: 1.8 mg/dL (ref 1.7–2.4)

## 2023-05-18 MED ORDER — SODIUM CHLORIDE 0.9 % IV SOLN
150.0000 mg | Freq: Once | INTRAVENOUS | Status: AC
  Administered 2023-05-18: 150 mg via INTRAVENOUS
  Filled 2023-05-18: qty 150

## 2023-05-18 MED ORDER — SODIUM CHLORIDE 0.9 % IV SOLN
Freq: Once | INTRAVENOUS | Status: AC
  Filled 2023-05-18: qty 250

## 2023-05-18 MED ORDER — CISPLATIN CHEMO INJECTION 100MG/100ML
40.0000 mg/m2 | Freq: Once | INTRAVENOUS | Status: AC
  Administered 2023-05-18: 77 mg via INTRAVENOUS
  Filled 2023-05-18: qty 77

## 2023-05-18 MED ORDER — MAGNESIUM SULFATE 2 GM/50ML IV SOLN
2.0000 g | Freq: Once | INTRAVENOUS | Status: AC
  Administered 2023-05-18: 2 g via INTRAVENOUS
  Filled 2023-05-18: qty 50

## 2023-05-18 MED ORDER — PALONOSETRON HCL INJECTION 0.25 MG/5ML
0.2500 mg | Freq: Once | INTRAVENOUS | Status: AC
  Administered 2023-05-18: 0.25 mg via INTRAVENOUS
  Filled 2023-05-18: qty 5

## 2023-05-18 MED ORDER — HEPARIN SOD (PORK) LOCK FLUSH 100 UNIT/ML IV SOLN
500.0000 [IU] | Freq: Once | INTRAVENOUS | Status: AC | PRN
  Administered 2023-05-18: 500 [IU]
  Filled 2023-05-18: qty 5

## 2023-05-18 MED ORDER — POTASSIUM CHLORIDE IN NACL 20-0.9 MEQ/L-% IV SOLN
Freq: Once | INTRAVENOUS | Status: AC
  Filled 2023-05-18: qty 1000

## 2023-05-18 MED ORDER — DEXAMETHASONE SODIUM PHOSPHATE 10 MG/ML IJ SOLN
2.0000 mg | Freq: Once | INTRAMUSCULAR | Status: AC
  Administered 2023-05-18: 2 mg via INTRAVENOUS
  Filled 2023-05-18: qty 1

## 2023-05-18 NOTE — Progress Notes (Signed)
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  He was subsequently seen by Dr. Lou Cal from ENT surgery at [facility] and underwent CT soft tissue neck as well as CT chest.  CT soft tissue neck showed ulcerated lesion involving the anterior lateral tongue measuring 1 x 3.4 cm in the axial plane.  1.9 cm in the coronal plane.  Relative sparing of the tongue base and floor of mouth.  Parapharyngeal and retropharyngeal spaces appear intact.  Small subcentimeter lymph nodes within bilateral neck which were nonspecific.  CT chest showed subcentimeter lung nodules that were nonspecific.  He had a biopsy of this tongue mass which was consistent with squamous cell carcinoma moderately differentiated.    He was seen by [facility]NT and underwent glossectomy and modified neck dissection on 03/14/2023 with tracheostomy and NG tube placement. Final Diagnosis        A. 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   B. LEFT SUBMANDIBULAR CONTENTS, DISSECTION:               Benign submandibular gland; negative for carcinoma.              Three benign lymph  nodes, negative for metastatic carcinoma (0/3).               C. SUBMENTAL CONTENTS, DISSECTION:               Four benign lymph nodes, negative for metastatic carcinoma (0/4).   D. RIGHT SUBMANDIBULAR CONTENTS, EXCISION:               Benign submandibular tissue.              Negative for carcinoma.   E. PAROTID GLAND, LEFT TAIL, EXCISION:               [place] tumor (1.1 cm); the tumor appears completely excised.              Negative for carcinoma.              Four benign lymph nodes, negative for metastatic carcinoma (0/4).   F. LEFT NECK CONTENTS LEVELS 2A, 3, 4 AND 5, DISSECTION:               Eight out of thirty-one lymph nodes, positive for metastatic squamous cell carcinoma (8/31).                           Size of largest nodal metastatic deposit: 3.3 cm. Extranodal extension: Identified. Benign salivary gland tissue; negative for malignancy.               G.  LEFT NECK CONTENTS LEVELS 2B, DISSECTION:               Nine benign lymph nodes, negative for metastatic carcinoma (0/9).    H. NODULE AT LEFT CAROTID BULB, EXCISION:               One benign lymph node, positive for metastatic squamous cell carcinoma (1/1). Size of metastatic deposit: 0.4 cm. Extranodal extension: Not identified.   I. TONGUE, LEFT, GLOSSECTOMY  :               Invasive squamous cell carcinoma, keratinizing, poorly differentiated with tumor necrosis.                           Tumor size: 5.0 cm in greatest dimension.                           Depth of invasion: 12 mm                           Lymphovascular and perineural invasion: Identified.                           Margins: Negative                           Pathologic stage: pT4a, pN3b.                           Please see CAP synoptic report.               J. TEETH 1,2,14,15, and 19, EXTRACTION:               Teeth (gross only)   Plan is for adjuvant concurrent chemoradiation with weekly Cisplatin.  PET/CT scan in September 2024 showed  hypermetabolic right cervical right supraclavicular adenopathy but no other evidence of distant metastatic disease.    Interval history-patient had feeding tube placement last week and did not come for his chemotherapy due to pain associated with the procedure.  Presently pain is better and he is tolerating tube feeds better.  Patient was supposed to take dexamethasone starting 1 day after chemotherapy only for 3 days but he has been taking Decadron every day for the last 2 weeks and therefore his blood sugar today is significantly elevated.  ECOG PS- 1 Pain scale- 2 Opioid associated constipation- no  Review of systems- Review of Systems  Constitutional:  Positive for malaise/fatigue. Negative for chills, fever and weight loss.  HENT:  Negative for congestion, ear discharge and nosebleeds.   Eyes:  Negative for blurred vision.  Respiratory:  Negative for cough, hemoptysis, sputum production, shortness of breath and wheezing.   Cardiovascular:  Negative for chest pain, palpitations, orthopnea and claudication.  Gastrointestinal:  Negative for abdominal pain, blood in stool, constipation, diarrhea, heartburn, melena, nausea and vomiting.  Genitourinary:  Negative for dysuria, flank pain, frequency, hematuria and urgency.  Musculoskeletal:  Negative for back pain, joint pain and myalgias.  Skin:  Negative for rash.  Neurological:  Negative for dizziness, tingling, focal weakness, seizures, weakness and headaches.  Endo/Heme/Allergies:  Does not bruise/bleed easily.  Psychiatric/Behavioral:  Negative for depression and suicidal ideas. The patient does not have insomnia.       Allergies  Allergen  Reactions   Statins Other (See Comments)    Loss of balance   Varenicline Anxiety and Other (See Comments)    violent  Violent, agitation   Lisinopril Other (See Comments)    hoarseness     Past Medical History:  Diagnosis Date   Cancer (HCC)    Diabetes mellitus without complication  (HCC)    Diabetic polyneuropathy associated with type 2 diabetes mellitus (HCC)    History of kidney stones    Hyperlipidemia    Hypertension    Kidney stones    Onychomycosis    Panlobular emphysema (HCC)    Squamous cell carcinoma, tongue border (HCC)    Tongue cancer (HCC)    Weight loss      Past Surgical History:  Procedure Laterality Date   COLONOSCOPY     COLONOSCOPY WITH PROPOFOL N/A 04/26/2023   Procedure: COLONOSCOPY WITH PROPOFOL;  Surgeon: [name], MD;  Location: [facility] ENDOSCOPY;  Service: Endoscopy;  Laterality: N/A;   CYST REMOVAL NECK     GASTROSTOMY N/A 05/10/2023   Procedure: INSERTION OF GASTROSTOMY TUBE, open;  Surgeon: Henrene Dodge, MD;  Location: [facility] ORS;  Service: General;  Laterality: N/A;   IR IMAGING GUIDED PORT INSERTION  04/14/2023   left arm muscle removal for tongue     partial tongue removal  03/14/2023   PORTA CATH INSERTION Right    tongue cancer     TRACHEOSTOMY  03/14/2023    Social History   Socioeconomic History   Marital status: Divorced    Spouse name: Not on file   Number of children: Not on file   Years of education: Not on file   Highest education level: Not on file  Occupational History   Not on file  Tobacco Use   Smoking status: Every Day    Current packs/day: 1.00    Average packs/day: 1 pack/day for 50.0 years (50.0 ttl pk-yrs)    Types: Cigarettes    Passive exposure: Past   Smokeless tobacco: Never  Vaping Use   Vaping status: Never Used  Substance and Sexual Activity   Alcohol use: Yes    Alcohol/week: 1.0 standard drink of alcohol    Types: 1 Cans of beer per week    Comment: Rare   Drug use: Never   Sexual activity: Not on file  Other Topics Concern   Not on file  Social History Narrative   Not on file   Social Determinants of Health   Financial Resource Strain: Patient Declined (02/18/2023)   Received from [facility]   Overall Financial Resource Strain (CARDIA)    Difficulty of Paying Living  Expenses: Patient declined  Food Insecurity: Medium Risk (03/21/2023)   Received from [facility]   Hunger Vital Sign    Worried About Running Out of Food in the Last Year: Sometimes true    Ran Out of Food in the Last Year: Never true  Transportation Needs: No Transportation Needs (03/21/2023)   Received from [facility]    In the past 12 months, has lack of reliable transportation kept you from medical appointments, meetings, work or from getting things needed for daily living? : No  Physical Activity: Insufficiently Active (02/18/2023)   Received from [facility]   Exercise Vital Sign    Days of Exercise per Week: 3 days    Minutes of Exercise per Session: 30 min  Stress: No Stress Concern Present (02/18/2023)   Received from [facility]  Institute of Occupational Health - Occupational Stress Questionnaire    Feeling of Stress : Only a little  Social Connections: Moderately Integrated (02/18/2023)   Received from [facility]   Social Network    How would you rate your social network (family, work, friends)?: Adequate participation with social networks  Intimate Partner Violence: Not At Risk (02/18/2023)   Received from [facility]   HITS    Over the last 12 months how often did your partner physically hurt you?: 1    Over the last 12 months how often did your partner insult you or talk down to you?: 1    Over the last 12 months how often did your partner threaten you with physical harm?: 1    Over the last 12 months how often did your partner scream or curse at you?: 1    Family History  Problem Relation Age of Onset   Hypertension Mother    Alzheimer's disease Mother    Alzheimer's disease Father    Heart disease Sister    Diabetes type I Brother      Current Outpatient Medications:    acetaminophen (TYLENOL) 160 MG/5ML solution, Take 320 mg by mouth every 6 (six) hours as needed., Disp: , Rfl:    aspirin 81 MG chewable tablet, Chew 81 mg by  mouth daily., Disp: , Rfl:    Blood Glucose Monitoring Suppl (ONETOUCH VERIO FLEX SYSTEM) w/Device KIT, by Does not apply route., Disp: , Rfl:    buPROPion (WELLBUTRIN SR) 150 MG 12 hr tablet, Take 150 mg by mouth daily., Disp: , Rfl:    dexamethasone (DECADRON) 4 MG tablet, Take 2 tablets (8 mg) by mouth daily x 3 days starting the day after cisplatin chemotherapy. Take with food., Disp: 30 tablet, Rfl: 1   glucose blood test strip, , Disp: , Rfl:    insulin regular (NOVOLIN R) 100 units/mL injection, Inject into the skin 3 (three) times daily before meals., Disp: , Rfl:    lidocaine-prilocaine (EMLA) cream, Apply to affected area once, Disp: 30 g, Rfl: 3   metoprolol succinate (TOPROL-XL) 50 MG 24 hr tablet, TAKE 1 TABLET BY MOUTH ONCE DAILY WITH  OR  IMMEDIATELY  FOLLOWING  A  MEAL, Disp: 90 tablet, Rfl: 1   Nutritional Supplements (FEEDING SUPPLEMENT, OSMOLITE 1.5 CAL,) LIQD, Give 1.5 cartons 4 times a day via feeding tube.  Flush with 60ml of water before and after each feeding. Provide additional 3 cups ( ) via feeding tube or orally to meet hydration needs.   Needs bolus supplies for tube feeding., Disp: , Rfl:    ondansetron (ZOFRAN) 8 MG tablet, Take 1 tablet (8 mg total) by mouth every 8 (eight) hours as needed for nausea or vomiting. Start on the third day after cisplatin., Disp: 30 tablet, Rfl: 1   oxyCODONE (ROXICODONE) 5 MG/5ML solution, Take 5 mLs (5 mg total) by mouth every 6 (six) hours as needed for severe pain (pain score 7-10)., Disp: 140 mL, Rfl: 0   prochlorperazine (COMPAZINE) 10 MG tablet, Take 1 tablet (10 mg total) by mouth every 6 (six) hours as needed (Nausea or vomiting)., Disp: 30 tablet, Rfl: 1 No current facility-administered medications for this visit.  Facility-Administered Medications Ordered in Other Visits:    CISplatin (PLATINOL) 77 mg in sodium chloride 0.9 % 250 mL chemo infusion, 40 mg/m2 (Treatment Plan Recorded), Intravenous, Once, Creig Hines, MD,  Last Rate: 327 mL/hr at 05/18/23 1313, 77 mg at 05/18/23 1313  Physical  exam:  Vitals:   05/18/23 0931 05/18/23 0936  BP: (!) 147/126 98/62  Pulse: 83   Resp: 18   Temp: (!) 96 F (35.6 C)   TempSrc: Tympanic   SpO2: 97%   Weight: 154 lb 4.8 oz (70 kg)   Height: 6\' 1"  (1.854 m)    Physical Exam Cardiovascular:     Rate and Rhythm: Normal rate and regular rhythm.     Heart sounds: Normal heart sounds.  Pulmonary:     Effort: Pulmonary effort is normal.     Breath sounds: Normal breath sounds.  Abdominal:     General: Bowel sounds are normal.     Palpations: Abdomen is soft.     Comments: PEG tube in place  Skin:    General: Skin is warm and dry.  Neurological:     Mental Status: He is alert and oriented to person, place, and time.         Latest Ref Rng & Units 05/18/2023    9:16 AM  CMP  Glucose 70 - 99 mg/dL 660   BUN 8 - 23 mg/dL 24   Creatinine 6.30 - 1.24 mg/dL 1.60   Sodium 109 - 323 mmol/L 124   Potassium 3.5 - 5.1 mmol/L 4.8   Chloride 98 - 111 mmol/L 87   CO2 22 - 32 mmol/L 30   Calcium 8.9 - 10.3 mg/dL 8.8       Latest Ref Rng & Units 05/18/2023    9:16 AM  CBC  WBC 4.0 - 10.5 K/uL 5.4   Hemoglobin 13.0 - 17.0 g/dL 55.7   Hematocrit 32.2 - 52.0 % 35.9   Platelets 150 - 400 K/uL 191      Assessment and plan- Patient is a 67 y.o. 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  He is here for on treatment assessment prior to cycle 3 of adjuvant cisplatin chemotherapy concurrent with radiation  Patient missed chemotherapy last week.  This will be cycle 3 of weekly cisplatin chemotherapy.  He will directly proceed for cycle 4 next week and I will see him back in 2 weeks for cycle 5.  Plan is to do total 7 cycles.  Hyperglycemia likely secondary to daily Decadron which he was not supposed to take every day.  Decadron was only meant to be taken for 3 days starting 1 day after chemotherapy.  I have asked  him to stop taking Decadron altogether.  He will continue his routine diabetes regimen blood sugars are still not controlled despite that he will need to get in touch with his primary care doctor  Hyponatremia: Suspect that is secondary to hyperglycemia.  Continue to monitor   Visit Diagnosis 1. Tongue cancer (HCC)   2. Encounter for antineoplastic chemotherapy   3. Steroid-induced hyperglycemia      Dr. Owens Shark, MD, MPH [facility] [phone] 05/18/2023 1:14 PM

## 2023-05-18 NOTE — Progress Notes (Signed)
Nutrition Follow-up:  Patient with SCC of oral cavity/oral tongue, stage IV.  S/p glossectomy with flap reconstruction at [facility].  Open G-tube performed on 10/22 at [facility].  Patient receiving radiation and chemotherapy.    Met with patient during infusion. Reports that he is using about 4 cartons of osmolite/equate plus/glucerna in tube daily.  Eating and drinking some puree foods by mouth.  Reports that he is getting use to new tube.      Medications: Says that he was taking dexamethasone daily not just for 3 days after treatment  Labs: glucose 474 today (? Steroid)  Anthropometrics:   Weight 154 lb 4.8 oz otday 154 lb on 10/31 159 lb 14.4 oz on 10/9 153 lb 14.4 oz on 9/26 219 lb on March 2023   NUTRITION DIAGNOSIS: Unintentional weight loss stable.     INTERVENTION: Patient knows to only use dexamethasone for 3 days following chemotherapy Recommend at least 4, ideally 6 cartons of 350 + calorie nutrition (osmolite 1.5) via feeding tube.  Anything that patient is able to eat or drink orally will just be additional calories and protein to help with weight gain.  Patient will need to drink or give additional 3 cups of fluid via tube for better hydration.      MONITORING, EVALUATION, GOAL: weight trends, intake, tube feeding tolerance   NEXT VISIT: Wednesday, Nov 13 during infusion  Shanika Levings B. Freida Busman, RD, LDN Registered Dietitian [phone]

## 2023-05-18 NOTE — Patient Instructions (Signed)

## 2023-05-18 NOTE — Progress Notes (Signed)
Per Dr. Smith Robert, OK to run post fluids+potassium with Cisplatin infusion.

## 2023-05-19 ENCOUNTER — Other Ambulatory Visit: Payer: Self-pay

## 2023-05-19 ENCOUNTER — Ambulatory Visit
Admission: RE | Admit: 2023-05-19 | Discharge: 2023-05-19 | Disposition: A | Discharge status: Home / Self Care | Payer: 59 | Source: Ambulatory Visit | Attending: Radiation Oncology | Admitting: Radiation Oncology

## 2023-05-19 DIAGNOSIS — Z5111 Encounter for antineoplastic chemotherapy: Secondary | ICD-10-CM | POA: Diagnosis not present

## 2023-05-19 LAB — RAD ONC ARIA SESSION SUMMARY
Course Elapsed Days: 21
Plan Fractions Treated to Date: 14
Plan Prescribed Dose Per Fraction: 2 Gy
Plan Total Fractions Prescribed: 35
Plan Total Prescribed Dose: 70 Gy
Reference Point Dosage Given to Date: 28 Gy
Reference Point Session Dosage Given: 2 Gy
Session Number: 14

## 2023-05-20 ENCOUNTER — Other Ambulatory Visit: Payer: Self-pay

## 2023-05-20 ENCOUNTER — Ambulatory Visit
Admission: RE | Admit: 2023-05-20 | Discharge: 2023-05-20 | Disposition: A | Discharge status: Home / Self Care | Payer: 59 | Source: Ambulatory Visit | Attending: Radiation Oncology | Admitting: Radiation Oncology

## 2023-05-20 DIAGNOSIS — C023 Malignant neoplasm of anterior two-thirds of tongue, part unspecified: Secondary | ICD-10-CM | POA: Insufficient documentation

## 2023-05-20 DIAGNOSIS — E119 Type 2 diabetes mellitus without complications: Secondary | ICD-10-CM | POA: Insufficient documentation

## 2023-05-20 DIAGNOSIS — E785 Hyperlipidemia, unspecified: Secondary | ICD-10-CM | POA: Insufficient documentation

## 2023-05-20 DIAGNOSIS — C770 Secondary and unspecified malignant neoplasm of lymph nodes of head, face and neck: Secondary | ICD-10-CM | POA: Diagnosis not present

## 2023-05-20 DIAGNOSIS — Z7984 Long term (current) use of oral hypoglycemic drugs: Secondary | ICD-10-CM | POA: Insufficient documentation

## 2023-05-20 DIAGNOSIS — Z7982 Long term (current) use of aspirin: Secondary | ICD-10-CM | POA: Insufficient documentation

## 2023-05-20 DIAGNOSIS — C021 Malignant neoplasm of border of tongue: Secondary | ICD-10-CM | POA: Diagnosis not present

## 2023-05-20 DIAGNOSIS — F17210 Nicotine dependence, cigarettes, uncomplicated: Secondary | ICD-10-CM | POA: Diagnosis not present

## 2023-05-20 DIAGNOSIS — E871 Hypo-osmolality and hyponatremia: Secondary | ICD-10-CM | POA: Diagnosis not present

## 2023-05-20 DIAGNOSIS — I10 Essential (primary) hypertension: Secondary | ICD-10-CM | POA: Insufficient documentation

## 2023-05-20 DIAGNOSIS — Z5111 Encounter for antineoplastic chemotherapy: Secondary | ICD-10-CM | POA: Diagnosis present

## 2023-05-20 DIAGNOSIS — Z87891 Personal history of nicotine dependence: Secondary | ICD-10-CM | POA: Insufficient documentation

## 2023-05-20 DIAGNOSIS — Z7952 Long term (current) use of systemic steroids: Secondary | ICD-10-CM | POA: Insufficient documentation

## 2023-05-20 DIAGNOSIS — Z79899 Other long term (current) drug therapy: Secondary | ICD-10-CM | POA: Insufficient documentation

## 2023-05-20 DIAGNOSIS — Z87442 Personal history of urinary calculi: Secondary | ICD-10-CM | POA: Insufficient documentation

## 2023-05-20 LAB — RAD ONC ARIA SESSION SUMMARY
Course Elapsed Days: 22
Plan Fractions Treated to Date: 15
Plan Prescribed Dose Per Fraction: 2 Gy
Plan Total Fractions Prescribed: 35
Plan Total Prescribed Dose: 70 Gy
Reference Point Dosage Given to Date: 30 Gy
Reference Point Session Dosage Given: 2 Gy
Session Number: 15

## 2023-05-23 ENCOUNTER — Other Ambulatory Visit: Payer: Self-pay

## 2023-05-23 ENCOUNTER — Ambulatory Visit
Admission: RE | Admit: 2023-05-23 | Discharge: 2023-05-23 | Disposition: A | Discharge status: Home / Self Care | Payer: 59 | Source: Ambulatory Visit | Attending: Radiation Oncology | Admitting: Radiation Oncology

## 2023-05-23 DIAGNOSIS — Z5111 Encounter for antineoplastic chemotherapy: Secondary | ICD-10-CM | POA: Diagnosis not present

## 2023-05-23 LAB — RAD ONC ARIA SESSION SUMMARY
Course Elapsed Days: 25
Plan Fractions Treated to Date: 16
Plan Prescribed Dose Per Fraction: 2 Gy
Plan Total Fractions Prescribed: 35
Plan Total Prescribed Dose: 70 Gy
Reference Point Dosage Given to Date: 32 Gy
Reference Point Session Dosage Given: 2 Gy
Session Number: 16

## 2023-05-24 ENCOUNTER — Other Ambulatory Visit: Payer: Self-pay

## 2023-05-24 ENCOUNTER — Other Ambulatory Visit: Payer: Self-pay | Admitting: Oncology

## 2023-05-24 ENCOUNTER — Ambulatory Visit
Admission: RE | Admit: 2023-05-24 | Discharge: 2023-05-24 | Disposition: A | Discharge status: Home / Self Care | Payer: 59 | Source: Ambulatory Visit | Attending: Radiation Oncology | Admitting: Radiation Oncology

## 2023-05-24 ENCOUNTER — Encounter: Payer: Self-pay | Admitting: Oncology

## 2023-05-24 DIAGNOSIS — C029 Malignant neoplasm of tongue, unspecified: Secondary | ICD-10-CM

## 2023-05-24 DIAGNOSIS — Z5111 Encounter for antineoplastic chemotherapy: Secondary | ICD-10-CM | POA: Diagnosis not present

## 2023-05-24 LAB — RAD ONC ARIA SESSION SUMMARY
Course Elapsed Days: 26
Plan Fractions Treated to Date: 17
Plan Prescribed Dose Per Fraction: 2 Gy
Plan Total Fractions Prescribed: 35
Plan Total Prescribed Dose: 70 Gy
Reference Point Dosage Given to Date: 34 Gy
Reference Point Session Dosage Given: 2 Gy
Session Number: 17

## 2023-05-25 ENCOUNTER — Other Ambulatory Visit: Payer: Self-pay

## 2023-05-25 ENCOUNTER — Ambulatory Visit
Admission: RE | Admit: 2023-05-25 | Discharge: 2023-05-25 | Disposition: A | Discharge status: Home / Self Care | Payer: 59 | Source: Ambulatory Visit | Attending: Radiation Oncology | Admitting: Radiation Oncology

## 2023-05-25 ENCOUNTER — Encounter: Payer: Self-pay | Admitting: Oncology

## 2023-05-25 DIAGNOSIS — Z5111 Encounter for antineoplastic chemotherapy: Secondary | ICD-10-CM | POA: Diagnosis not present

## 2023-05-25 LAB — RAD ONC ARIA SESSION SUMMARY
Course Elapsed Days: 27
Plan Fractions Treated to Date: 18
Plan Prescribed Dose Per Fraction: 2 Gy
Plan Total Fractions Prescribed: 35
Plan Total Prescribed Dose: 70 Gy
Reference Point Dosage Given to Date: 36 Gy
Reference Point Session Dosage Given: 2 Gy
Session Number: 18

## 2023-05-25 MED FILL — Fosaprepitant Dimeglumine For IV Infusion 150 MG (Base Eq): INTRAVENOUS | Qty: 5 | Status: AC

## 2023-05-26 ENCOUNTER — Encounter: Payer: Self-pay | Admitting: *Deleted

## 2023-05-26 ENCOUNTER — Ambulatory Visit
Admission: RE | Admit: 2023-05-26 | Discharge: 2023-05-26 | Disposition: A | Discharge status: Home / Self Care | Payer: 59 | Source: Ambulatory Visit | Attending: Radiation Oncology | Admitting: Radiation Oncology

## 2023-05-26 ENCOUNTER — Inpatient Hospital Stay: Payer: 59 | Attending: Oncology

## 2023-05-26 ENCOUNTER — Inpatient Hospital Stay: Payer: 59

## 2023-05-26 ENCOUNTER — Other Ambulatory Visit: Payer: Self-pay

## 2023-05-26 DIAGNOSIS — Z87442 Personal history of urinary calculi: Secondary | ICD-10-CM | POA: Insufficient documentation

## 2023-05-26 DIAGNOSIS — Z5111 Encounter for antineoplastic chemotherapy: Secondary | ICD-10-CM | POA: Diagnosis not present

## 2023-05-26 DIAGNOSIS — Z7984 Long term (current) use of oral hypoglycemic drugs: Secondary | ICD-10-CM | POA: Insufficient documentation

## 2023-05-26 DIAGNOSIS — Z7982 Long term (current) use of aspirin: Secondary | ICD-10-CM | POA: Insufficient documentation

## 2023-05-26 DIAGNOSIS — I10 Essential (primary) hypertension: Secondary | ICD-10-CM | POA: Insufficient documentation

## 2023-05-26 DIAGNOSIS — E785 Hyperlipidemia, unspecified: Secondary | ICD-10-CM | POA: Insufficient documentation

## 2023-05-26 DIAGNOSIS — C021 Malignant neoplasm of border of tongue: Secondary | ICD-10-CM | POA: Insufficient documentation

## 2023-05-26 DIAGNOSIS — Z7952 Long term (current) use of systemic steroids: Secondary | ICD-10-CM | POA: Insufficient documentation

## 2023-05-26 DIAGNOSIS — E119 Type 2 diabetes mellitus without complications: Secondary | ICD-10-CM | POA: Insufficient documentation

## 2023-05-26 DIAGNOSIS — C770 Secondary and unspecified malignant neoplasm of lymph nodes of head, face and neck: Secondary | ICD-10-CM | POA: Insufficient documentation

## 2023-05-26 DIAGNOSIS — Z79899 Other long term (current) drug therapy: Secondary | ICD-10-CM | POA: Insufficient documentation

## 2023-05-26 DIAGNOSIS — E871 Hypo-osmolality and hyponatremia: Secondary | ICD-10-CM | POA: Insufficient documentation

## 2023-05-26 DIAGNOSIS — C023 Malignant neoplasm of anterior two-thirds of tongue, part unspecified: Secondary | ICD-10-CM | POA: Insufficient documentation

## 2023-05-26 DIAGNOSIS — F17210 Nicotine dependence, cigarettes, uncomplicated: Secondary | ICD-10-CM | POA: Insufficient documentation

## 2023-05-26 LAB — RAD ONC ARIA SESSION SUMMARY
Course Elapsed Days: 28
Plan Fractions Treated to Date: 19
Plan Prescribed Dose Per Fraction: 2 Gy
Plan Total Fractions Prescribed: 35
Plan Total Prescribed Dose: 70 Gy
Reference Point Dosage Given to Date: 38 Gy
Reference Point Session Dosage Given: 2 Gy
Session Number: 19

## 2023-05-26 NOTE — Progress Notes (Signed)
Cardiology Clinic Note   Date: 05/27/2023 ID: Shane Perry, DOB Feb 18, 1956, MRN [id number]  Primary Cardiologist:  Julien Nordmann, MD  Patient Profile    Shane Perry is a 67 y.o. male who presents to the clinic today for routine follow up.     Past medical history significant for: PVCs. Echo 05/21/2021: EF 50 to 55%.  No RWMA.  Grade I DD.  Normal strain.  Normal RV size/function.  Moderate LAE.  Mild RAE.  Mild aortic valve sclerosis without stenosis. Hypertension. T2DM. Tongue cancer. Emphysema. Tobacco abuse.  In summary, patient was first evaluated by Dr. Mariah Milling on 04/15/2021 for irregular heart rhythm at the request of Patsey Berthold, NP.  EKG revealed normal sinus rhythm with PVCs.  Patient was asymptomatic.  Echo obtained to rule out structural heart disease and detailed above.     History of Present Illness    Shane Perry is followed by Dr. Mariah Milling for the above outlined history.  Patient was last seen in the office by Dr. Mariah Milling on 10/12/2021 for routine follow-up.  He was doing well at that time and no changes were made.  Discussed the use of AI scribe software for clinical note transcription with the patient, who gave verbal consent to proceed.  The patient is here alone. Since his last visit he underwent tongue removal secondary to lung cancer and has a tracheostomy. He is currently undergoing chemo and radiation. He is able to swallow liquids and his main source of sustenance is shake supplements and sometimes soup. He does have a PEG and he utilizes it for crushed medications. He has not been on Toprol for several months, as he was told to stop it secondary to low BP.  His only complaint today is fatigue. He continues to smoke. He has been unable to take Chantix secondary to a bad reaction to the medication. He was taking Wellbutrin but he is not able to crush it so he has not been on it for some time.        ROS: All other systems reviewed and are  otherwise negative except as noted in History of Present Illness.  Studies Reviewed    EKG Interpretation Date/Time:  Friday May 27 2023 14:29:15 EST Ventricular Rate:  85 PR Interval:  168 QRS Duration:  100 QT Interval:  352 QTC Calculation: 418 R Axis:   88  Text Interpretation: Normal sinus rhythm Normal ECG When compared with ECG of 23-Dec-2022 10:38, PREVIOUS ECG IS PRESENT Confirmed by Carlos Levering [id number]) on 05/27/2023 2:43:25 PM           Physical Exam    VS:  BP (!) 100/58 (BP Location: Left Arm, Patient Position: Sitting, Cuff Size: Normal)   Pulse 85   Ht 6\' 1"  (1.854 m)   Wt 152 lb 8 oz (69.2 kg)   SpO2 97%   BMI 20.12 kg/m  , BMI Body mass index is 20.12 kg/m.  GEN: Well nourished, well developed, in no acute distress. Neck: No JVD or carotid bruits. Cardiac:  RRR. No murmurs. No rubs or gallops.   Respiratory:  Respirations regular and unlabored. Clear to auscultation without rales, wheezing or rhonchi. GI: Soft, nontender, nondistended. Extremities: Radials/DP/PT 2+ and equal bilaterally. No clubbing or cyanosis. No edema.  Skin: Warm and dry, no rash. Neuro: Strength intact.  Assessment & Plan      Premature Ventricular Contractions (PVCs) History of PVCs, but none noted today. Patient has stopped taking Metoprolol due to  low blood pressure. No current symptoms of palpitations. EKG shows NSR.  -Discontinue Metoprolol due to low blood pressure and absence of symptoms. -Advise patient to contact the office if symptoms of palpitations return.  Tongue Cancer S/P tongue removal and tracheostomy. Patient undergoing radiation and chemotherapy. Reports fatigue likely secondary to treatment. -Continue current treatment plan with oncology.  Tobacco abuse Patient continues to smoke. He was unable to tolerate Chantix secondary to mood changes. He was taking Wellbutrin but is unable to crush it so it has been stopped.  -Instructed to discuss other  options for cessation with PCP.  Hypertension BP today 100/58. He denies dizziness. Not taking Toprol as above.  -Remain off anti-hypertensives.        Disposition: Return in 1 year or sooner as needed.          Signed, Etta Grandchild. Korin Setzler, DNP, NP-C

## 2023-05-27 ENCOUNTER — Ambulatory Visit
Admission: RE | Admit: 2023-05-27 | Discharge: 2023-05-27 | Disposition: A | Discharge status: Home / Self Care | Payer: 59 | Source: Ambulatory Visit | Attending: Radiation Oncology | Admitting: Radiation Oncology

## 2023-05-27 ENCOUNTER — Other Ambulatory Visit: Payer: Self-pay

## 2023-05-27 ENCOUNTER — Ambulatory Visit: Payer: 59 | Attending: Student | Admitting: Student

## 2023-05-27 ENCOUNTER — Encounter: Payer: Self-pay | Admitting: Oncology

## 2023-05-27 ENCOUNTER — Encounter: Payer: Self-pay | Admitting: Student

## 2023-05-27 VITALS — BP 100/58 | HR 85 | Ht 73.0 in | Wt 152.5 lb

## 2023-05-27 DIAGNOSIS — Z720 Tobacco use: Secondary | ICD-10-CM

## 2023-05-27 DIAGNOSIS — I493 Ventricular premature depolarization: Secondary | ICD-10-CM | POA: Diagnosis not present

## 2023-05-27 DIAGNOSIS — C029 Malignant neoplasm of tongue, unspecified: Secondary | ICD-10-CM | POA: Diagnosis not present

## 2023-05-27 DIAGNOSIS — E1159 Type 2 diabetes mellitus with other circulatory complications: Secondary | ICD-10-CM

## 2023-05-27 DIAGNOSIS — I152 Hypertension secondary to endocrine disorders: Secondary | ICD-10-CM

## 2023-05-27 DIAGNOSIS — Z5111 Encounter for antineoplastic chemotherapy: Secondary | ICD-10-CM | POA: Diagnosis not present

## 2023-05-27 LAB — RAD ONC ARIA SESSION SUMMARY
Course Elapsed Days: 29
Plan Fractions Treated to Date: 20
Plan Prescribed Dose Per Fraction: 2 Gy
Plan Total Fractions Prescribed: 35
Plan Total Prescribed Dose: 70 Gy
Reference Point Dosage Given to Date: 40 Gy
Reference Point Session Dosage Given: 2 Gy
Session Number: 20

## 2023-05-27 NOTE — Patient Instructions (Signed)
Medication Instructions:  Your physician recommends that you continue on your current medications as directed. Please refer to the Current Medication list given to you today.  *If you need a refill on your cardiac medications before your next appointment, please call your pharmacy*  Lab Work: - None ordered  Testing/Procedures: - None ordered  Follow-Up: At [facility], you and your health needs are our priority.  As part of our continuing mission to provide you with exceptional heart care, we have created designated Provider Care Teams.  These Care Teams include your primary Cardiologist (physician) and Advanced Practice Providers (APPs -  Physician Assistants and Nurse Practitioners) who all work together to provide you with the care you need, when you need it.  Your next appointment:   1 year(s)  Provider:   Carlos Levering, NP    Other Instructions - None

## 2023-05-30 ENCOUNTER — Other Ambulatory Visit: Payer: Self-pay

## 2023-05-30 ENCOUNTER — Ambulatory Visit
Admission: RE | Admit: 2023-05-30 | Discharge: 2023-05-30 | Disposition: A | Discharge status: Home / Self Care | Payer: 59 | Source: Ambulatory Visit | Attending: Radiation Oncology | Admitting: Radiation Oncology

## 2023-05-30 DIAGNOSIS — C029 Malignant neoplasm of tongue, unspecified: Secondary | ICD-10-CM

## 2023-05-30 DIAGNOSIS — Z5111 Encounter for antineoplastic chemotherapy: Secondary | ICD-10-CM | POA: Diagnosis not present

## 2023-05-30 LAB — RAD ONC ARIA SESSION SUMMARY
Course Elapsed Days: 32
Plan Fractions Treated to Date: 21
Plan Prescribed Dose Per Fraction: 2 Gy
Plan Total Fractions Prescribed: 35
Plan Total Prescribed Dose: 70 Gy
Reference Point Dosage Given to Date: 42 Gy
Reference Point Session Dosage Given: 2 Gy
Session Number: 21

## 2023-05-31 ENCOUNTER — Other Ambulatory Visit: Payer: Self-pay

## 2023-05-31 ENCOUNTER — Inpatient Hospital Stay: Payer: 59

## 2023-05-31 ENCOUNTER — Other Ambulatory Visit: Payer: 59

## 2023-05-31 ENCOUNTER — Ambulatory Visit
Admission: RE | Admit: 2023-05-31 | Discharge: 2023-05-31 | Disposition: A | Discharge status: Home / Self Care | Payer: 59 | Source: Ambulatory Visit | Attending: Radiation Oncology | Admitting: Radiation Oncology

## 2023-05-31 ENCOUNTER — Ambulatory Visit: Payer: 59 | Admitting: Oncology

## 2023-05-31 ENCOUNTER — Encounter: Payer: Self-pay | Admitting: Oncology

## 2023-05-31 ENCOUNTER — Other Ambulatory Visit: Payer: Self-pay | Admitting: Oncology

## 2023-05-31 ENCOUNTER — Inpatient Hospital Stay (HOSPITAL_BASED_OUTPATIENT_CLINIC_OR_DEPARTMENT_OTHER): Payer: 59 | Admitting: Oncology

## 2023-05-31 VITALS — BP 109/68 | HR 88 | Temp 95.2°F | Resp 20 | Wt 125.3 lb

## 2023-05-31 DIAGNOSIS — C029 Malignant neoplasm of tongue, unspecified: Secondary | ICD-10-CM

## 2023-05-31 DIAGNOSIS — E871 Hypo-osmolality and hyponatremia: Secondary | ICD-10-CM

## 2023-05-31 DIAGNOSIS — Z95828 Presence of other vascular implants and grafts: Secondary | ICD-10-CM

## 2023-05-31 DIAGNOSIS — Z5111 Encounter for antineoplastic chemotherapy: Secondary | ICD-10-CM | POA: Diagnosis not present

## 2023-05-31 LAB — CBC WITH DIFFERENTIAL (CANCER CENTER ONLY)
Abs Immature Granulocytes: 0.06 10*3/uL (ref 0.00–0.07)
Basophils Absolute: 0 10*3/uL (ref 0.0–0.1)
Basophils Relative: 0 %
Eosinophils Absolute: 0 10*3/uL (ref 0.0–0.5)
Eosinophils Relative: 0 %
HCT: 35.2 % — ABNORMAL LOW (ref 39.0–52.0)
Hemoglobin: 12.5 g/dL — ABNORMAL LOW (ref 13.0–17.0)
Immature Granulocytes: 1 %
Lymphocytes Relative: 3 %
Lymphs Abs: 0.3 10*3/uL — ABNORMAL LOW (ref 0.7–4.0)
MCH: 30.7 pg (ref 26.0–34.0)
MCHC: 35.5 g/dL (ref 30.0–36.0)
MCV: 86.5 fL (ref 80.0–100.0)
Monocytes Absolute: 0.6 10*3/uL (ref 0.1–1.0)
Monocytes Relative: 7 %
Neutro Abs: 8.1 10*3/uL — ABNORMAL HIGH (ref 1.7–7.7)
Neutrophils Relative %: 89 %
Platelet Count: 213 10*3/uL (ref 150–400)
RBC: 4.07 MIL/uL — ABNORMAL LOW (ref 4.22–5.81)
RDW: 13.8 % (ref 11.5–15.5)
WBC Count: 9.1 10*3/uL (ref 4.0–10.5)
nRBC: 0 % (ref 0.0–0.2)

## 2023-05-31 LAB — RAD ONC ARIA SESSION SUMMARY
Course Elapsed Days: 33
Plan Fractions Treated to Date: 22
Plan Prescribed Dose Per Fraction: 2 Gy
Plan Total Fractions Prescribed: 35
Plan Total Prescribed Dose: 70 Gy
Reference Point Dosage Given to Date: 44 Gy
Reference Point Session Dosage Given: 2 Gy
Session Number: 22

## 2023-05-31 LAB — BASIC METABOLIC PANEL - CANCER CENTER ONLY
Anion gap: 12 (ref 5–15)
BUN: 13 mg/dL (ref 8–23)
CO2: 28 mmol/L (ref 22–32)
Calcium: 9 mg/dL (ref 8.9–10.3)
Chloride: 80 mmol/L — ABNORMAL LOW (ref 98–111)
Creatinine: 0.45 mg/dL — ABNORMAL LOW (ref 0.61–1.24)
GFR, Estimated: >60 mL/min (ref >60)
Glucose, Bld: 266 mg/dL — ABNORMAL HIGH (ref 70–99)
Potassium: 4.6 mmol/L (ref 3.5–5.1)
Sodium: 121 mmol/L — ABNORMAL LOW (ref 135–145)

## 2023-05-31 LAB — MAGNESIUM: Magnesium: 1.7 mg/dL (ref 1.7–2.4)

## 2023-05-31 MED ORDER — SODIUM CHLORIDE 0.9% FLUSH
10.0000 mL | Freq: Once | INTRAVENOUS | Status: AC
  Administered 2023-05-31: 10 mL via INTRAVENOUS
  Filled 2023-05-31: qty 10

## 2023-05-31 MED ORDER — HEPARIN SOD (PORK) LOCK FLUSH 100 UNIT/ML IV SOLN
500.0000 [IU] | Freq: Once | INTRAVENOUS | Status: AC
  Administered 2023-05-31: 500 [IU] via INTRAVENOUS
  Filled 2023-05-31: qty 5

## 2023-05-31 MED ORDER — SODIUM CHLORIDE 0.9 % IV SOLN
Freq: Once | INTRAVENOUS | Status: AC
  Filled 2023-05-31: qty 250

## 2023-05-31 MED FILL — Fosaprepitant Dimeglumine For IV Infusion 150 MG (Base Eq): INTRAVENOUS | Qty: 5 | Status: AC

## 2023-05-31 NOTE — Progress Notes (Signed)
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  He was subsequently seen by Dr. Lou Cal from ENT surgery at [facility] and underwent CT soft tissue neck as well as CT chest.  CT soft tissue neck showed ulcerated lesion involving the anterior lateral tongue measuring 1 x 3.4 cm in the axial plane.  1.9 cm in the coronal plane.  Relative sparing of the tongue base and floor of mouth.  Parapharyngeal and retropharyngeal spaces appear intact.  Small subcentimeter lymph nodes within bilateral neck which were nonspecific.  CT chest showed subcentimeter lung nodules that were nonspecific.  He had a biopsy of this tongue mass which was consistent with squamous cell carcinoma moderately differentiated.    He was seen by [facility] and underwent glossectomy and modified neck dissection on 03/14/2023 with tracheostomy and NG tube placement. Final Diagnosis        A. 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   B. LEFT SUBMANDIBULAR CONTENTS, DISSECTION:               Benign submandibular gland; negative for carcinoma.               Three benign lymph nodes, negative for metastatic carcinoma (0/3).               C. SUBMENTAL CONTENTS, DISSECTION:               Four benign lymph nodes, negative for metastatic carcinoma (0/4).   D. RIGHT SUBMANDIBULAR CONTENTS, EXCISION:               Benign submandibular tissue.              Negative for carcinoma.   E. PAROTID GLAND, LEFT TAIL, EXCISION:               W[place]in tumor (1.1 cm); the tumor appears completely excised.              Negative for carcinoma.              Four benign lymph nodes, negative for metastatic carcinoma (0/4).   F. LEFT NECK CONTENTS LEVELS 2A, 3, 4 AND 5, DISSECTION:               Eight out of thirty-one lymph nodes, positive for metastatic squamous cell carcinoma (8/31).                           Size of largest nodal metastatic deposit: 3.3 cm. Extranodal extension: Identified. Benign salivary gland tissue; negative for malignancy.  G. LEFT NECK CONTENTS LEVELS 2B, DISSECTION:               Nine benign lymph nodes, negative for metastatic carcinoma (0/9).    H. NODULE AT LEFT CAROTID BULB, EXCISION:               One benign lymph node, positive for metastatic squamous cell carcinoma (1/1). Size of metastatic deposit: 0.4 cm. Extranodal extension: Not identified.   I. TONGUE, LEFT, GLOSSECTOMY  :               Invasive squamous cell carcinoma, keratinizing, poorly differentiated with tumor necrosis.                           Tumor size: 5.0 cm in greatest dimension.                           Depth of invasion: 12 mm                           Lymphovascular and perineural invasion: Identified.                           Margins: Negative                           Pathologic stage: pT4a, pN3b.                           Please see CAP synoptic report.               J. TEETH 1,2,14,15, and 19, EXTRACTION:               Teeth (gross only)   Plan is for adjuvant concurrent chemoradiation with weekly Cisplatin.  PET/CT scan in  September 2024 showed hypermetabolic right cervical right supraclavicular adenopathy but no other evidence of distant metastatic disease.      Interval history-patient missed chemotherapy last week.  Overall tolerating radiation treatment well.  Pain is currently well-controlled and he is only using occasional Tylenol.  He has been using PEG tube for most of his nutritional needs.  Occasional constipation for which he uses MiraLAX.  ECOG PS- 1 Pain scale- 0   Review of systems- Review of Systems  Constitutional:  Positive for malaise/fatigue. Negative for chills, fever and weight loss.  HENT:  Negative for congestion, ear discharge and nosebleeds.   Eyes:  Negative for blurred vision.  Respiratory:  Negative for cough, hemoptysis, sputum production, shortness of breath and wheezing.   Cardiovascular:  Negative for chest pain, palpitations, orthopnea and claudication.  Gastrointestinal:  Negative for abdominal pain, blood in stool, constipation, diarrhea, heartburn, melena, nausea and vomiting.  Genitourinary:  Negative for dysuria, flank pain, frequency, hematuria and urgency.  Musculoskeletal:  Negative for back pain, joint pain and myalgias.  Skin:  Negative for rash.  Neurological:  Negative for dizziness, tingling, focal weakness, seizures, weakness and headaches.  Endo/Heme/Allergies:  Does not bruise/bleed easily.  Psychiatric/Behavioral:  Negative for depression and suicidal ideas. The patient does not have insomnia.       Allergies  Allergen Reactions   Statins Other (See Comments)    Loss of balance   Varenicline Anxiety and Other (See Comments)    violent  Violent,  agitation   Lisinopril Other (See Comments)    hoarseness     Past Medical History:  Diagnosis Date   Cancer (HCC)    Diabetes mellitus without complication (HCC)    Diabetic polyneuropathy associated with type 2 diabetes mellitus (HCC)    History of kidney stones    Hyperlipidemia    Hypertension     Kidney stones    Onychomycosis    Panlobular emphysema (HCC)    Squamous cell carcinoma, tongue border (HCC)    Tongue cancer (HCC)    Weight loss      Past Surgical History:  Procedure Laterality Date   COLONOSCOPY     COLONOSCOPY WITH PROPOFOL N/A 04/26/2023   Procedure: COLONOSCOPY WITH PROPOFOL;  Surgeon: [name], MD;  Location: [facility] ENDOSCOPY;  Service: Endoscopy;  Laterality: N/A;   CYST REMOVAL NECK     GASTROSTOMY N/A 05/10/2023   Procedure: INSERTION OF GASTROSTOMY TUBE, open;  Surgeon: Henrene Dodge, MD;  Location: [facility] ORS;  Service: General;  Laterality: N/A;   IR IMAGING GUIDED PORT INSERTION  04/14/2023   left arm muscle removal for tongue     partial tongue removal  03/14/2023   PORTA CATH INSERTION Right    tongue cancer     TRACHEOSTOMY  03/14/2023    Social History   Socioeconomic History   Marital status: Divorced    Spouse name: Not on file   Number of children: Not on file   Years of education: Not on file   Highest education level: Not on file  Occupational History   Not on file  Tobacco Use   Smoking status: Every Day    Current packs/day: 1.00    Average packs/day: 1 pack/day for 50.0 years (50.0 ttl pk-yrs)    Types: Cigarettes    Passive exposure: Past   Smokeless tobacco: Never  Vaping Use   Vaping status: Never Used  Substance and Sexual Activity   Alcohol use: Not Currently    Alcohol/week: 1.0 standard drink of alcohol    Types: 1 Cans of beer per week    Comment: Rare   Drug use: Never   Sexual activity: Not on file  Other Topics Concern   Not on file  Social History Narrative   Not on file   Social Determinants of Health   Financial Resource Strain: Patient Declined (02/18/2023)   Received from [facility]   Overall Financial Resource Strain (CARDIA)    Difficulty of Paying Living Expenses: Patient declined  Food Insecurity: Medium Risk (03/21/2023)   Received from [facility]   Hunger Vital Sign    Ran Out of  Food in the Last Year: Never true    Worried About Running Out of Food in the Last Year: Sometimes true  Transportation Needs: No Transportation Needs (03/21/2023)   Received from [facility]    In the past 12 months, has lack of reliable transportation kept you from medical appointments, meetings, work or from getting things needed for daily living? : No  Physical Activity: Insufficiently Active (02/18/2023)   Received from [facility]   Exercise Vital Sign    Days of Exercise per Week: 3 days    Minutes of Exercise per Session: 30 min  Stress: No Stress Concern Present (02/18/2023)   Received from [facility] - Occupational Stress Questionnaire    Feeling of Stress : Only a little  Social Connections: Moderately Integrated (02/18/2023)  Received from [facility]   Social Network    How would you rate your social network (family, work, friends)?: Adequate participation with social networks  Intimate Partner Violence: Not At Risk (02/18/2023)   Received from [facility]   HITS    Over the last 12 months how often did your partner physically hurt you?: Never    Over the last 12 months how often did your partner insult you or talk down to you?: Never    Over the last 12 months how often did your partner threaten you with physical harm?: Never    Over the last 12 months how often did your partner scream or curse at you?: Never    Family History  Problem Relation Age of Onset   Hypertension Mother    Alzheimer's disease Mother    Alzheimer's disease Father    Heart disease Sister    Diabetes type I Brother      Current Outpatient Medications:    acetaminophen (TYLENOL) 160 MG/5ML solution, Take 320 mg by mouth every 6 (six) hours as needed., Disp: , Rfl:    aspirin 81 MG chewable tablet, Chew 81 mg by mouth daily., Disp: , Rfl:    Blood Glucose Monitoring Suppl (ONETOUCH VERIO FLEX SYSTEM) w/Device KIT, by Does not  apply route., Disp: , Rfl:    buPROPion (WELLBUTRIN SR) 150 MG 12 hr tablet, Take 150 mg by mouth daily., Disp: , Rfl:    dexamethasone (DECADRON) 4 MG tablet, Take 2 tablets (8 mg) by mouth daily x 3 days starting the day after cisplatin chemotherapy. Take with food., Disp: 30 tablet, Rfl: 1   glucose blood test strip, , Disp: , Rfl:    insulin regular (NOVOLIN R) 100 units/mL injection, Inject into the skin 3 (three) times daily before meals., Disp: , Rfl:    lidocaine-prilocaine (EMLA) cream, Apply to affected area once, Disp: 30 g, Rfl: 3   Nutritional Supplements (FEEDING SUPPLEMENT, OSMOLITE 1.5 CAL,) LIQD, Give 1.5 cartons 4 times a day via feeding tube.  Flush with 60ml of water before and after each feeding. Provide additional 3 cups ( ) via feeding tube or orally to meet hydration needs.   Needs bolus supplies for tube feeding., Disp: , Rfl:    ondansetron (ZOFRAN) 8 MG tablet, Take 1 tablet (8 mg total) by mouth every 8 (eight) hours as needed for nausea or vomiting. Start on the third day after cisplatin., Disp: 30 tablet, Rfl: 1   oxyCODONE (ROXICODONE) 5 MG/5ML solution, Take 5 mLs (5 mg total) by mouth every 6 (six) hours as needed for severe pain (pain score 7-10)., Disp: 140 mL, Rfl: 0   prochlorperazine (COMPAZINE) 10 MG tablet, Take 1 tablet (10 mg total) by mouth every 6 (six) hours as needed (Nausea or vomiting)., Disp: 30 tablet, Rfl: 1  Physical exam: There were no vitals filed for this visit. Physical Exam HENT:     Mouth/Throat:     Mouth: Mucous membranes are moist.     Pharynx: Oropharynx is clear.     Comments: S/p glossectomy and reconstruction flap surgery Cardiovascular:     Rate and Rhythm: Normal rate and regular rhythm.     Heart sounds: Normal heart sounds.  Pulmonary:     Effort: Pulmonary effort is normal.     Breath sounds: Normal breath sounds.  Abdominal:     General: Bowel sounds are normal.     Palpations: Abdomen is soft.     Comments: PEG  tube in place  Skin:    General: Skin is warm and dry.  Neurological:     Mental Status: He is alert and oriented to person, place, and time.         Latest Ref Rng & Units 05/31/2023    9:05 AM  CMP  Glucose 70 - 99 mg/dL 536   BUN 8 - 23 mg/dL 13   Creatinine 6.44 - 1.24 mg/dL 0.34   Sodium 742 - 595 mmol/L 121   Potassium 3.5 - 5.1 mmol/L 4.6   Chloride 98 - 111 mmol/L 80   CO2 22 - 32 mmol/L 28   Calcium 8.9 - 10.3 mg/dL 9.0       Latest Ref Rng & Units 05/31/2023    9:05 AM  CBC  WBC 4.0 - 10.5 K/uL 9.1   Hemoglobin 13.0 - 17.0 g/dL 63.8   Hematocrit 75.6 - 52.0 % 35.2   Platelets 150 - 400 K/uL 213     No images are attached to the encounter.  No results found.   Assessment and plan- Patient is a 67 y.o. 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  He is here for on treatment assessment prior to cycle 4 of adjuvant cisplatin chemotherapy concurrent with radiation  Patient will proceed with cycle 4 of cisplatin chemotherapy tomorrow.  He will directly proceed for cycle 5 of treatment next week and I will see him back in 2 weeks for cycle 6 plan is to do total 7 cycles.  Hyponatremia: I will give him 1 L of IV fluids today.  I have also asked him to improve his intake of electrolytes such as Pedialyte or Gatorade  Constipation: I have asked him to add senna to MiraLAX   Visit Diagnosis 1. Tongue cancer (HCC)   2. Encounter for antineoplastic chemotherapy      Dr. Owens Shark, MD, MPH [facility] at [facility] [phone] 05/31/2023 9:15 AM

## 2023-05-31 NOTE — Addendum Note (Signed)
Addended by: Owens Shark C on: 05/31/2023 10:33 AM   Modules accepted: Orders

## 2023-06-01 ENCOUNTER — Encounter: Payer: Self-pay | Admitting: Oncology

## 2023-06-01 ENCOUNTER — Inpatient Hospital Stay: Payer: 59

## 2023-06-01 ENCOUNTER — Ambulatory Visit: Payer: 59

## 2023-06-01 ENCOUNTER — Other Ambulatory Visit: Payer: 59

## 2023-06-01 NOTE — Progress Notes (Signed)
Nutrition  Patient did not show up for infusion or nutrition visit today.  Will follow-up on 11/20  Craig Wisnewski B. Freida Busman, RD, LDN Registered Dietitian [phone]

## 2023-06-02 ENCOUNTER — Ambulatory Visit: Payer: 59

## 2023-06-02 ENCOUNTER — Other Ambulatory Visit: Payer: Self-pay

## 2023-06-03 ENCOUNTER — Ambulatory Visit: Payer: 59

## 2023-06-06 ENCOUNTER — Other Ambulatory Visit: Payer: Self-pay

## 2023-06-06 ENCOUNTER — Ambulatory Visit
Admission: RE | Admit: 2023-06-06 | Discharge: 2023-06-06 | Disposition: A | Discharge status: Home / Self Care | Payer: 59 | Source: Ambulatory Visit | Attending: Radiation Oncology | Admitting: Radiation Oncology

## 2023-06-06 DIAGNOSIS — Z5111 Encounter for antineoplastic chemotherapy: Secondary | ICD-10-CM | POA: Diagnosis not present

## 2023-06-06 LAB — RAD ONC ARIA SESSION SUMMARY
Course Elapsed Days: 39
Plan Fractions Treated to Date: 23
Plan Prescribed Dose Per Fraction: 2 Gy
Plan Total Fractions Prescribed: 35
Plan Total Prescribed Dose: 70 Gy
Reference Point Dosage Given to Date: 46 Gy
Reference Point Session Dosage Given: 2 Gy
Session Number: 23

## 2023-06-06 MED FILL — Fosaprepitant Dimeglumine For IV Infusion 150 MG (Base Eq): INTRAVENOUS | Qty: 5 | Status: AC

## 2023-06-07 ENCOUNTER — Ambulatory Visit
Admission: RE | Admit: 2023-06-07 | Discharge: 2023-06-07 | Disposition: A | Discharge status: Home / Self Care | Payer: 59 | Source: Ambulatory Visit | Attending: Radiation Oncology | Admitting: Radiation Oncology

## 2023-06-07 ENCOUNTER — Other Ambulatory Visit: Payer: Self-pay

## 2023-06-07 DIAGNOSIS — Z5111 Encounter for antineoplastic chemotherapy: Secondary | ICD-10-CM | POA: Diagnosis not present

## 2023-06-07 LAB — RAD ONC ARIA SESSION SUMMARY
Course Elapsed Days: 40
Plan Fractions Treated to Date: 24
Plan Prescribed Dose Per Fraction: 2 Gy
Plan Total Fractions Prescribed: 35
Plan Total Prescribed Dose: 70 Gy
Reference Point Dosage Given to Date: 48 Gy
Reference Point Session Dosage Given: 2 Gy
Session Number: 24

## 2023-06-08 ENCOUNTER — Other Ambulatory Visit: Payer: 59

## 2023-06-08 ENCOUNTER — Inpatient Hospital Stay: Payer: 59

## 2023-06-08 ENCOUNTER — Ambulatory Visit: Payer: 59

## 2023-06-08 ENCOUNTER — Ambulatory Visit: Admission: RE | Admit: 2023-06-08 | Payer: 59 | Source: Ambulatory Visit

## 2023-06-08 ENCOUNTER — Ambulatory Visit: Payer: 59 | Admitting: Oncology

## 2023-06-08 NOTE — Progress Notes (Signed)
Nutrition  Patient did not come to nutrition appointment or other appointments today.    Shane Perry B. Freida Busman, RD, LDN Registered Dietitian [phone]

## 2023-06-09 ENCOUNTER — Other Ambulatory Visit: Payer: Self-pay

## 2023-06-09 ENCOUNTER — Ambulatory Visit
Admission: RE | Admit: 2023-06-09 | Discharge: 2023-06-09 | Disposition: A | Discharge status: Home / Self Care | Payer: 59 | Source: Ambulatory Visit | Attending: Radiation Oncology | Admitting: Radiation Oncology

## 2023-06-09 DIAGNOSIS — Z5111 Encounter for antineoplastic chemotherapy: Secondary | ICD-10-CM | POA: Diagnosis not present

## 2023-06-09 LAB — RAD ONC ARIA SESSION SUMMARY
Course Elapsed Days: 42
Plan Fractions Treated to Date: 25
Plan Prescribed Dose Per Fraction: 2 Gy
Plan Total Fractions Prescribed: 35
Plan Total Prescribed Dose: 70 Gy
Reference Point Dosage Given to Date: 50 Gy
Reference Point Session Dosage Given: 2 Gy
Session Number: 25

## 2023-06-10 ENCOUNTER — Ambulatory Visit
Admission: RE | Admit: 2023-06-10 | Discharge: 2023-06-10 | Disposition: A | Discharge status: Home / Self Care | Payer: 59 | Source: Ambulatory Visit | Attending: Radiation Oncology | Admitting: Radiation Oncology

## 2023-06-10 ENCOUNTER — Other Ambulatory Visit: Payer: Self-pay

## 2023-06-10 DIAGNOSIS — Z5111 Encounter for antineoplastic chemotherapy: Secondary | ICD-10-CM | POA: Diagnosis not present

## 2023-06-10 LAB — RAD ONC ARIA SESSION SUMMARY
Course Elapsed Days: 43
Plan Fractions Treated to Date: 26
Plan Prescribed Dose Per Fraction: 2 Gy
Plan Total Fractions Prescribed: 35
Plan Total Prescribed Dose: 70 Gy
Reference Point Dosage Given to Date: 52 Gy
Reference Point Session Dosage Given: 2 Gy
Session Number: 26

## 2023-06-13 ENCOUNTER — Other Ambulatory Visit: Payer: Self-pay

## 2023-06-13 ENCOUNTER — Ambulatory Visit
Admission: RE | Admit: 2023-06-13 | Discharge: 2023-06-13 | Disposition: A | Discharge status: Home / Self Care | Payer: 59 | Source: Ambulatory Visit | Attending: Radiation Oncology | Admitting: Radiation Oncology

## 2023-06-13 ENCOUNTER — Inpatient Hospital Stay (HOSPITAL_BASED_OUTPATIENT_CLINIC_OR_DEPARTMENT_OTHER): Payer: 59 | Admitting: Nurse Practitioner

## 2023-06-13 ENCOUNTER — Encounter: Payer: Self-pay | Admitting: Nurse Practitioner

## 2023-06-13 VITALS — BP 106/78 | HR 92 | Temp 96.8°F | Wt 127.0 lb

## 2023-06-13 DIAGNOSIS — G893 Neoplasm related pain (acute) (chronic): Secondary | ICD-10-CM

## 2023-06-13 DIAGNOSIS — Z5111 Encounter for antineoplastic chemotherapy: Secondary | ICD-10-CM | POA: Diagnosis not present

## 2023-06-13 LAB — RAD ONC ARIA SESSION SUMMARY
Course Elapsed Days: 46
Plan Fractions Treated to Date: 27
Plan Prescribed Dose Per Fraction: 2 Gy
Plan Total Fractions Prescribed: 35
Plan Total Prescribed Dose: 70 Gy
Reference Point Dosage Given to Date: 54 Gy
Reference Point Session Dosage Given: 2 Gy
Session Number: 27

## 2023-06-13 MED ORDER — ACETAMINOPHEN 160 MG/5ML PO SOLN: 320.0000 mg | Freq: Four times a day (QID) | ORAL | 1 refills | Status: DC | PRN

## 2023-06-13 MED ORDER — OXYCODONE HCL 5 MG/5ML PO SOLN: 5.0000 mg | Freq: Four times a day (QID) | ORAL | 0 refills | Status: DC | PRN

## 2023-06-13 NOTE — Progress Notes (Signed)
Symptom Management Clinic  [facility] at [facility] A Department of the [facility]. [facility] [address], [address] [place], [place] [address] [phone] (phone) [phone] (fax)  Patient Care Team: Stevphen Rochester, MD as PCP - General (Family Medicine) Mariah Milling, Tollie Pizza, MD as PCP - Cardiology (Cardiology) Carmina Miller, MD as Consulting Physician (Radiation Oncology) Creig Hines, MD as Consulting Physician (Oncology)   Name of the patient: Shane Perry  [id number]  March 05, 1956   Date of visit: 06/13/23  Diagnosis- Glenna Durand Cancer  Chief complaint/ Reason for visit- Pain  Heme/Onc history:  Oncology History  Tongue cancer (HCC)  02/01/2023 Initial Diagnosis   Tongue cancer (HCC)   02/01/2023 Cancer Staging   Staging form: Oral Cavity, AJCC 8th Edition - Clinical stage from 02/01/2023: Stage II (cT2, cN0, cM0) - Signed by Creig Hines, MD on 02/01/2023 Histologic grade (G): G2 Histologic grading system: 3 grade system   03/29/2023 Cancer Staging   Staging form: Oral Cavity, AJCC 8th Edition - Pathologic stage from 03/29/2023: Stage IVB (pT4a, pN3b, cM0) - Signed by Creig Hines, MD on 03/29/2023   04/27/2023 -  Chemotherapy   Patient is on Treatment Plan : HEAD/NECK Cisplatin (40) q7d       Interval history- Patient is 67 year old male, currently undergoing treatment for stage IVB SCC of the tongue, currently undergoing chemotherapy and radiation, who presents to Symptom Management Clinic for complaints of mouth pain. This is his ongoing discomfort that comes and goes. Slightly increased with radiation treatments. He has intermittently used tylenol and oxycodone solutions with improvement in pain. He is running low on medication and worried about running out over the holiday week. No new symptoms and otherwise denies complaints.   Review of systems- Review of Systems  Constitutional:  Negative for chills, fever,  malaise/fatigue and weight loss.  HENT:  Negative for congestion and sore throat.        Jaw & neck pain  Respiratory:  Positive for cough and sputum production. Negative for hemoptysis and shortness of breath.   Cardiovascular:  Negative for chest pain.  Gastrointestinal:  Negative for nausea and vomiting.  Musculoskeletal:  Negative for back pain and falls.  Neurological:  Negative for dizziness and loss of consciousness.  Psychiatric/Behavioral:  Positive for depression. The patient is not nervous/anxious.       Allergies  Allergen Reactions   Statins Other (See Comments)    Loss of balance   Varenicline Anxiety and Other (See Comments)    violent  Violent, agitation   Lisinopril Other (See Comments)    hoarseness    Past Medical History:  Diagnosis Date   Cancer (HCC)    Diabetes mellitus without complication (HCC)    Diabetic polyneuropathy associated with type 2 diabetes mellitus (HCC)    History of kidney stones    Hyperlipidemia    Hypertension    Kidney stones    Onychomycosis    Panlobular emphysema (HCC)    Squamous cell carcinoma, tongue border (HCC)    Tongue cancer (HCC)    Weight loss     Past Surgical History:  Procedure Laterality Date   COLONOSCOPY     COLONOSCOPY WITH PROPOFOL N/A 04/26/2023   Procedure: COLONOSCOPY WITH PROPOFOL;  Surgeon: [name], MD;  Location: [facility] ENDOSCOPY;  Service: Endoscopy;  Laterality: N/A;   CYST REMOVAL NECK     GASTROSTOMY N/A 05/10/2023   Procedure: INSERTION OF GASTROSTOMY TUBE, open;  Surgeon: Henrene Dodge, MD;  Location: [facility] ORS;  Service: General;  Laterality: N/A;   IR IMAGING GUIDED PORT INSERTION  04/14/2023   left arm muscle removal for tongue     partial tongue removal  03/14/2023   PORTA CATH INSERTION Right    tongue cancer     TRACHEOSTOMY  03/14/2023    Social History   Socioeconomic History   Marital status: Divorced    Spouse name: Not on file   Number of children: Not on file    Years of education: Not on file   Highest education level: Not on file  Occupational History   Not on file  Tobacco Use   Smoking status: Every Day    Current packs/day: 1.00    Average packs/day: 1 pack/day for 50.0 years (50.0 ttl pk-yrs)    Types: Cigarettes    Passive exposure: Past   Smokeless tobacco: Never  Vaping Use   Vaping status: Never Used  Substance and Sexual Activity   Alcohol use: Not Currently    Alcohol/week: 1.0 standard drink of alcohol    Types: 1 Cans of beer per week    Comment: Rare   Drug use: Never   Sexual activity: Not on file  Other Topics Concern   Not on file  Social History Narrative   Not on file   Social Determinants of Health   Financial Resource Strain: Patient Declined (02/18/2023)   Received from [facility]   Overall Financial Resource Strain (CARDIA)    Difficulty of Paying Living Expenses: Patient declined  Food Insecurity: Medium Risk (03/21/2023)   Received from [facility]   Hunger Vital Sign    Ran Out of Food in the Last Year: Never true    Worried About Running Out of Food in the Last Year: Sometimes true  Transportation Needs: No Transportation Needs (03/21/2023)   Received from [facility]    In the past 12 months, has lack of reliable transportation kept you from medical appointments, meetings, work or from getting things needed for daily living? : No  Physical Activity: Insufficiently Active (02/18/2023)   Received from [facility]   Exercise Vital Sign    Days of Exercise per Week: 3 days    Minutes of Exercise per Session: 30 min  Stress: No Stress Concern Present (02/18/2023)   Received from [facility] of Occupational Health - Occupational Stress Questionnaire    Feeling of Stress : Only a little  Social Connections: Moderately Integrated (02/18/2023)   [facility]   Social Network    How would you rate your social network (family, work, friends)?: Adequate  participation with social networks  Intimate Partner Violence: Not At Risk (02/18/2023)   Received from [facility]   HITS    Over the last 12 months how often did your partner physically hurt you?: Never    Over the last 12 months how often did your partner insult you or talk down to you?: Never    Over the last 12 months how often did your partner threaten you with physical harm?: Never    Over the last 12 months how often did your partner scream or curse at you?: Never    Family History  Problem Relation Age of Onset   Hypertension Mother    Alzheimer's disease Mother    Alzheimer's disease Father    Heart disease Sister    Diabetes type I Brother  Current Outpatient Medications:    aspirin 81 MG chewable tablet, Chew 81 mg by mouth daily., Disp: , Rfl:    Blood Glucose Monitoring Suppl (ONETOUCH VERIO FLEX SYSTEM) w/Device KIT, by Does not apply route., Disp: , Rfl:    buPROPion (WELLBUTRIN SR) 150 MG 12 hr tablet, Take 150 mg by mouth daily., Disp: , Rfl:    dexamethasone (DECADRON) 4 MG tablet, Take 2 tablets (8 mg) by mouth daily x 3 days starting the day after cisplatin chemotherapy. Take with food., Disp: 30 tablet, Rfl: 1   glucose blood test strip, , Disp: , Rfl:    insulin regular (NOVOLIN R) 100 units/mL injection, Inject into the skin 3 (three) times daily before meals., Disp: , Rfl:    lidocaine-prilocaine (EMLA) cream, Apply to affected area once, Disp: 30 g, Rfl: 3   Nutritional Supplements (FEEDING SUPPLEMENT, OSMOLITE 1.5 CAL,) LIQD, Give 1.5 cartons 4 times a day via feeding tube.  Flush with 60ml of water before and after each feeding. Provide additional 3 cups ( ) via feeding tube or orally to meet hydration needs.   Needs bolus supplies for tube feeding., Disp: , Rfl:    ondansetron (ZOFRAN) 8 MG tablet, Take 1 tablet (8 mg total) by mouth every 8 (eight) hours as needed for nausea or vomiting. Start on the third day after cisplatin., Disp: 30 tablet, Rfl:  1   prochlorperazine (COMPAZINE) 10 MG tablet, Take 1 tablet (10 mg total) by mouth every 6 (six) hours as needed (Nausea or vomiting)., Disp: 30 tablet, Rfl: 1   acetaminophen (TYLENOL) 160 MG/5ML solution, Take 10 mLs (320 mg total) by mouth every 6 (six) hours as needed for moderate pain (pain score 4-6)., Disp: 473 mL, Rfl: 1   oxyCODONE (ROXICODONE) 5 MG/5ML solution, Take 5 mLs (5 mg total) by mouth every 6 (six) hours as needed for severe pain (pain score 7-10)., Disp: 140 mL, Rfl: 0  Physical exam:  Vitals:   06/13/23 1044  BP: 106/78  Pulse: 92  Temp: (!) 96.8 F (36 C)  TempSrc: Tympanic  SpO2: 96%  Weight: 127 lb (57.6 kg)   Physical Exam Constitutional:      Appearance: He is not ill-appearing.     Comments: Thin build  HENT:     Mouth/Throat:     Comments: Poor dentition. Post surgical changes. Fullness of bilateral jaw. Erythema of neck and jawline.  Neck:     Comments: trach Neurological:     Mental Status: He is alert and oriented to person, place, and time.  Psychiatric:        Mood and Affect: Mood normal.        Behavior: Behavior normal.     Assessment and plan- Patient is a 67 y.o. male   Neoplasm related pain- well controlled with oxycodone 5 mg q6h prn. Refill provided today. Refill of acetaminophen 320 mg q6h prn. Refill provided. Will refer to palliative care for ongoing symptom management.  Stage IVB head and neck cancer- SCC of the tongue. Currently undergoing concurrently cisplatin and radiation. Tolerating well. Continue follow up with Dr Smith Robert.   Disposition:  Ref to palliative Follow up with med-onc and rad-onc as scheduled. RTC sooner if symptoms odn't imporve or worsen in the interim.    Visit Diagnosis 1. Neoplasm related pain     Patient expressed understanding and was in agreement with this plan. He also understands that He can call clinic at any time with any questions, concerns, or complaints.  Thank you for allowing me to  participate in the care of this pleasant patient.   Consuello Masse, DNP, AGNP-C, AOCNP Cancer Center at [facility] [phone]

## 2023-06-14 ENCOUNTER — Other Ambulatory Visit: Payer: Self-pay

## 2023-06-14 ENCOUNTER — Ambulatory Visit
Admission: RE | Admit: 2023-06-14 | Discharge: 2023-06-14 | Disposition: A | Discharge status: Home / Self Care | Payer: 59 | Source: Ambulatory Visit | Attending: Radiation Oncology | Admitting: Radiation Oncology

## 2023-06-14 DIAGNOSIS — Z5111 Encounter for antineoplastic chemotherapy: Secondary | ICD-10-CM | POA: Diagnosis not present

## 2023-06-14 LAB — RAD ONC ARIA SESSION SUMMARY
Course Elapsed Days: 47
Plan Fractions Treated to Date: 28
Plan Prescribed Dose Per Fraction: 2 Gy
Plan Total Fractions Prescribed: 35
Plan Total Prescribed Dose: 70 Gy
Reference Point Dosage Given to Date: 56 Gy
Reference Point Session Dosage Given: 2 Gy
Session Number: 28

## 2023-06-14 MED FILL — Fosaprepitant Dimeglumine For IV Infusion 150 MG (Base Eq): INTRAVENOUS | Qty: 5 | Status: AC

## 2023-06-15 ENCOUNTER — Ambulatory Visit: Payer: 59 | Admitting: Oncology

## 2023-06-15 ENCOUNTER — Ambulatory Visit: Payer: 59

## 2023-06-15 ENCOUNTER — Inpatient Hospital Stay: Payer: 59

## 2023-06-15 ENCOUNTER — Inpatient Hospital Stay: Payer: 59 | Admitting: Licensed Clinical Social Worker

## 2023-06-15 ENCOUNTER — Other Ambulatory Visit: Payer: Self-pay

## 2023-06-15 ENCOUNTER — Other Ambulatory Visit: Payer: 59

## 2023-06-15 ENCOUNTER — Ambulatory Visit
Admission: RE | Admit: 2023-06-15 | Discharge: 2023-06-15 | Disposition: A | Discharge status: Home / Self Care | Payer: 59 | Source: Ambulatory Visit | Attending: Radiation Oncology | Admitting: Radiation Oncology

## 2023-06-15 ENCOUNTER — Inpatient Hospital Stay (HOSPITAL_BASED_OUTPATIENT_CLINIC_OR_DEPARTMENT_OTHER): Payer: 59 | Admitting: Hospice and Palliative Medicine

## 2023-06-15 VITALS — BP 110/64 | HR 79 | Temp 96.5°F | Resp 18 | Ht 73.0 in | Wt 157.3 lb

## 2023-06-15 DIAGNOSIS — C029 Malignant neoplasm of tongue, unspecified: Secondary | ICD-10-CM

## 2023-06-15 DIAGNOSIS — Z515 Encounter for palliative care: Secondary | ICD-10-CM

## 2023-06-15 DIAGNOSIS — Z5111 Encounter for antineoplastic chemotherapy: Secondary | ICD-10-CM | POA: Diagnosis not present

## 2023-06-15 LAB — CBC WITH DIFFERENTIAL (CANCER CENTER ONLY)
Abs Immature Granulocytes: 0.02 10*3/uL (ref 0.00–0.07)
Basophils Absolute: 0 10*3/uL (ref 0.0–0.1)
Basophils Relative: 0 %
Eosinophils Absolute: 0.1 10*3/uL (ref 0.0–0.5)
Eosinophils Relative: 1 %
HCT: 35.7 % — ABNORMAL LOW (ref 39.0–52.0)
Hemoglobin: 12.3 g/dL — ABNORMAL LOW (ref 13.0–17.0)
Immature Granulocytes: 0 %
Lymphocytes Relative: 3 %
Lymphs Abs: 0.2 10*3/uL — ABNORMAL LOW (ref 0.7–4.0)
MCH: 30.8 pg (ref 26.0–34.0)
MCHC: 34.5 g/dL (ref 30.0–36.0)
MCV: 89.3 fL (ref 80.0–100.0)
Monocytes Absolute: 0.5 10*3/uL (ref 0.1–1.0)
Monocytes Relative: 6 %
Neutro Abs: 7.1 10*3/uL (ref 1.7–7.7)
Neutrophils Relative %: 90 %
Platelet Count: 193 10*3/uL (ref 150–400)
RBC: 4 MIL/uL — ABNORMAL LOW (ref 4.22–5.81)
RDW: 14.6 % (ref 11.5–15.5)
WBC Count: 8 10*3/uL (ref 4.0–10.5)
nRBC: 0 % (ref 0.0–0.2)

## 2023-06-15 LAB — BASIC METABOLIC PANEL - CANCER CENTER ONLY
Anion gap: 8 (ref 5–15)
BUN: 17 mg/dL (ref 8–23)
CO2: 32 mmol/L (ref 22–32)
Calcium: 9 mg/dL (ref 8.9–10.3)
Chloride: 87 mmol/L — ABNORMAL LOW (ref 98–111)
Creatinine: 0.51 mg/dL — ABNORMAL LOW (ref 0.61–1.24)
GFR, Estimated: >60 mL/min (ref >60)
Glucose, Bld: 245 mg/dL — ABNORMAL HIGH (ref 70–99)
Potassium: 4.4 mmol/L (ref 3.5–5.1)
Sodium: 127 mmol/L — ABNORMAL LOW (ref 135–145)

## 2023-06-15 LAB — RAD ONC ARIA SESSION SUMMARY
Course Elapsed Days: 48
Plan Fractions Treated to Date: 29
Plan Prescribed Dose Per Fraction: 2 Gy
Plan Total Fractions Prescribed: 35
Plan Total Prescribed Dose: 70 Gy
Reference Point Dosage Given to Date: 58 Gy
Reference Point Session Dosage Given: 2 Gy
Session Number: 29

## 2023-06-15 LAB — MAGNESIUM: Magnesium: 1.9 mg/dL (ref 1.7–2.4)

## 2023-06-15 MED ORDER — SODIUM CHLORIDE 0.9 % IV SOLN
Freq: Once | INTRAVENOUS | Status: AC
Start: 2023-06-15 — End: 2023-06-15
  Filled 2023-06-15: qty 250

## 2023-06-15 MED ORDER — SODIUM CHLORIDE 0.9 % IV SOLN
40.0000 mg/m2 | Freq: Once | INTRAVENOUS | Status: AC
  Administered 2023-06-15: 77 mg via INTRAVENOUS
  Filled 2023-06-15: qty 77

## 2023-06-15 MED ORDER — POTASSIUM CHLORIDE IN NACL 20-0.9 MEQ/L-% IV SOLN
Freq: Once | INTRAVENOUS | Status: AC
  Filled 2023-06-15: qty 1000

## 2023-06-15 MED ORDER — HEPARIN SOD (PORK) LOCK FLUSH 100 UNIT/ML IV SOLN
500.0000 [IU] | Freq: Once | INTRAVENOUS | Status: AC | PRN
  Administered 2023-06-15: 500 [IU]
  Filled 2023-06-15: qty 5

## 2023-06-15 MED ORDER — PALONOSETRON HCL INJECTION 0.25 MG/5ML
0.2500 mg | Freq: Once | INTRAVENOUS | Status: AC
  Administered 2023-06-15: 0.25 mg via INTRAVENOUS
  Filled 2023-06-15: qty 5

## 2023-06-15 MED ORDER — DEXAMETHASONE SODIUM PHOSPHATE 10 MG/ML IJ SOLN
4.0000 mg | Freq: Once | INTRAMUSCULAR | Status: AC
  Administered 2023-06-15: 4 mg via INTRAVENOUS
  Filled 2023-06-15: qty 1

## 2023-06-15 MED ORDER — MAGNESIUM SULFATE 2 GM/50ML IV SOLN
2.0000 g | Freq: Once | INTRAVENOUS | Status: AC
  Administered 2023-06-15: 2 g via INTRAVENOUS

## 2023-06-15 MED ORDER — SODIUM CHLORIDE 0.9 % IV SOLN
150.0000 mg | Freq: Once | INTRAVENOUS | Status: AC
  Administered 2023-06-15: 150 mg via INTRAVENOUS
  Filled 2023-06-15: qty 150

## 2023-06-15 NOTE — Progress Notes (Signed)
Nutrition Follow-up:  Patient with SCC of oral cavity/oral tongue, stage IV.  S/p glossectomy with flap reconstruction at [facility].  Open G-tube performed on 10/22 at [facility].  Patient receiving radiation and cisplatin.  Met with patient during infusion.  Reports that he does not have any taste.  Having trouble eating due to radiation, all foods have to be applesauce consistency.  Says that he is using 6 cartons of osmolite 1.5 daily and sometimes add equate plus (2 cartons).  Says that he is giving 11 oz of water with each feeding (at least 4 times a day).  Reports bowel movement every 3 days    Medications: reviewed  Labs: Na 127, glucose 245  Anthropometrics:   Weight 157 lb today  154 lb 4.8 oz on 10/30 154 lb on 10/31 159 lb on 10/9 153 lb on 9/26 219 lb on March 2023   Estimated Energy Needs  Kcals: 2200-2500 Protein: 100-125 g Fluid: > 2.2 L  NUTRITION DIAGNOSIS: Unintentional weight loss improving relying on feeding tube   INTERVENTION:  Reduce free water to 4 cups per day.  Free water in osmolite 1.5 =1078ml.   Continue tube feeding of osmolite 1.5 and equate plus shakes 6-8 per day.   Patient eating per SLP recommendations    MONITORING, EVALUATION, GOAL: weight trends, intake   NEXT VISIT: Wednesday, Dec 4 during infusion  Farley Crooker B. Freida Busman, RD, LDN Registered Dietitian [phone]

## 2023-06-15 NOTE — Progress Notes (Signed)
Palliative Medicine [facility] at [facility] [phone] [phone]) [phone]   Name: Shane Perry Date: 06/15/2023 MRN: [id number]  DOB: Feb 17, 1956  Patient Care Team: Stevphen Rochester, MD as PCP - General (Family Medicine) Mariah Milling Tollie Pizza, MD as PCP - Cardiology (Cardiology) Carmina Miller, MD as Consulting Physician (Radiation Oncology) Creig Hines, MD as Consulting Physician (Oncology)    REASON FOR CONSULTATION: Shane Perry is a 67 y.o. 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 Currently undergoing chemotherapy and radiation.  Palliative care was consulted to address goals and manage ongoing symptoms.  SOCIAL HISTORY:     reports that he has been smoking cigarettes. He has a 50 pack-year smoking history. He has been exposed to tobacco smoke. He has never used smokeless tobacco. He reports that he does not currently use alcohol after a past usage of about 1.0 standard drink of alcohol per week. He reports that he does not use drugs.  Patient is unmarried.  He has a son who lives nearby but recently had a stroke.  Patient has another son in [place].  Patient has a brother who lives nearby and is available to help if needed.  Patient was a truck Hospital doctor.   ADVANCE DIRECTIVES:    CODE STATUS:   PAST MEDICAL HISTORY: Past Medical History:  Diagnosis Date   Cancer (HCC)    Diabetes mellitus without complication (HCC)    Diabetic polyneuropathy associated with type 2 diabetes mellitus (HCC)    History of kidney stones    Hyperlipidemia    Hypertension    Kidney stones    Onychomycosis    Panlobular emphysema (HCC)    Squamous cell carcinoma, tongue border (HCC)    Tongue cancer (HCC)    Weight loss     PAST SURGICAL HISTORY:  Past Surgical History:  Procedure Laterality Date    COLONOSCOPY     COLONOSCOPY WITH PROPOFOL N/A 04/26/2023   Procedure: COLONOSCOPY WITH PROPOFOL;  Surgeon: [name], MD;  Location: [facility] ENDOSCOPY;  Service: Endoscopy;  Laterality: N/A;   CYST REMOVAL NECK     GASTROSTOMY N/A 05/10/2023   Procedure: INSERTION OF GASTROSTOMY TUBE, open;  Surgeon: Henrene Dodge, MD;  Location: [facility] ORS;  Service: General;  Laterality: N/A;   IR IMAGING GUIDED PORT INSERTION  04/14/2023   left arm muscle removal for tongue     partial tongue removal  03/14/2023   PORTA CATH INSERTION Right    tongue cancer     TRACHEOSTOMY  03/14/2023    HEMATOLOGY/ONCOLOGY HISTORY:  Oncology History  Tongue cancer (HCC)  02/01/2023 Initial Diagnosis   Tongue cancer (HCC)   02/01/2023 Cancer Staging   Staging form: Oral Cavity, AJCC 8th Edition - Clinical stage from 02/01/2023: Stage II (cT2, cN0, cM0) - Signed by Creig Hines, MD on 02/01/2023 Histologic grade (G): G2 Histologic grading system: 3 grade system   03/29/2023 Cancer Staging   Staging form: Oral Cavity, AJCC 8th Edition - Pathologic stage from 03/29/2023: Stage IVB (pT4a, pN3b, cM0) - Signed by Creig Hines, MD on 03/29/2023   04/27/2023 -  Chemotherapy   Patient is on Treatment Plan : HEAD/NECK Cisplatin (40) q7d       ALLERGIES:  is allergic to statins, varenicline, and lisinopril.  MEDICATIONS:  Current Outpatient Medications  Medication Sig Dispense Refill   acetaminophen (TYLENOL)  160 MG/5ML solution Take 10 mLs (320 mg total) by mouth every 6 (six) hours as needed for moderate pain (pain score 4-6). 473 mL 1   aspirin 81 MG chewable tablet Chew 81 mg by mouth daily.     Blood Glucose Monitoring Suppl (ONETOUCH VERIO FLEX SYSTEM) w/Device KIT by Does not apply route.     buPROPion (WELLBUTRIN SR) 150 MG 12 hr tablet Take 150 mg by mouth daily.     dexamethasone (DECADRON) 4 MG tablet Take 2 tablets (8 mg) by mouth daily x 3 days starting the day after cisplatin chemotherapy. Take with food.  30 tablet 1   glucose blood test strip      insulin regular (NOVOLIN R) 100 units/mL injection Inject into the skin 3 (three) times daily before meals.     lidocaine-prilocaine (EMLA) cream Apply to affected area once 30 g 3   Nutritional Supplements (FEEDING SUPPLEMENT, OSMOLITE 1.5 CAL,) LIQD Give 1.5 cartons 4 times a day via feeding tube.  Flush with 60ml of water before and after each feeding. Provide additional 3 cups ( ) via feeding tube or orally to meet hydration needs.   Needs bolus supplies for tube feeding.     ondansetron (ZOFRAN) 8 MG tablet Take 1 tablet (8 mg total) by mouth every 8 (eight) hours as needed for nausea or vomiting. Start on the third day after cisplatin. 30 tablet 1   oxyCODONE (ROXICODONE) 5 MG/5ML solution Take 5 mLs (5 mg total) by mouth every 6 (six) hours as needed for severe pain (pain score 7-10). 140 mL 0   prochlorperazine (COMPAZINE) 10 MG tablet Take 1 tablet (10 mg total) by mouth every 6 (six) hours as needed (Nausea or vomiting). 30 tablet 1   No current facility-administered medications for this visit.   Facility-Administered Medications Ordered in Other Visits  Medication Dose Route Frequency Provider Last Rate Last Admin   0.9 %  sodium chloride infusion   Intravenous Once Creig Hines, MD       0.9 % NaCl with KCl 20 mEq/ L  infusion   Intravenous Once Creig Hines, MD       dexamethasone (DECADRON) injection 4 mg  4 mg Intravenous Once Creig Hines, MD       fosaprepitant (EMEND) 150 mg in sodium chloride 0.9 % 145 mL IVPB  150 mg Intravenous Once Creig Hines, MD       magnesium sulfate IVPB 2 g 50 mL  2 g Intravenous Once Creig Hines, MD       palonosetron (ALOXI) injection 0.25 mg  0.25 mg Intravenous Once Creig Hines, MD        VITAL SIGNS: There were no vitals taken for this visit. There were no vitals filed for this visit.  Estimated body mass index is 20.75 kg/m as calculated from the following:   Height as of an  earlier encounter on 06/15/23: 6\' 1"  (1.854 m).   Weight as of an earlier encounter on 06/15/23: 157 lb 4.8 oz (71.3 kg).  LABS: CBC:    Component Value Date/Time   WBC 8.0 06/15/2023 0831   WBC 7.8 12/23/2022 1057   HGB 12.3 (L) 06/15/2023 0831   HCT 35.7 (L) 06/15/2023 0831   PLT 193 06/15/2023 0831   MCV 89.3 06/15/2023 0831   NEUTROABS 7.1 06/15/2023 0831   LYMPHSABS 0.2 (L) 06/15/2023 0831   MONOABS 0.5 06/15/2023 0831   EOSABS 0.1 06/15/2023 0831   BASOSABS 0.0  06/15/2023 0831   Comprehensive Metabolic Panel:    Component Value Date/Time   NA 127 (L) 06/15/2023 0831   K 4.4 06/15/2023 0831   CL 87 (L) 06/15/2023 0831   CO2 32 06/15/2023 0831   BUN 17 06/15/2023 0831   CREATININE 0.51 (L) 06/15/2023 0831   GLUCOSE 245 (H) 06/15/2023 0831   CALCIUM 9.0 06/15/2023 0831   AST 42 (H) 12/23/2022 1057   ALT 18 12/23/2022 1057   ALKPHOS 39 12/23/2022 1057   BILITOT 0.5 12/23/2022 1057   PROT 6.6 12/23/2022 1057   ALBUMIN 4.0 12/23/2022 1057    RADIOGRAPHIC STUDIES: No results found.  PERFORMANCE STATUS (ECOG) : 1 - Symptomatic but completely ambulatory  Review of Systems Unless otherwise noted, a complete review of systems is negative.  Physical Exam General: NAD HEENT: Trach Pulmonary: Unlabored Abdomen:  PEG noted but not visualized Skin: no rashes Neurological: Weakness but otherwise nonfocal  IMPRESSION: Patient seen in infusion.  Introduced palliative care services and attempted to establish therapeutic rapport.  Overall, patient says that he is doing well and denies significant complaints or concerns today.  He says that he has occasional generalized pain.  Also has a occasional pain in the head neck area from his tumor and treatment.  However, he says that pain overall is stable.  He is mostly taking liquid acetaminophen, which he finds controls the pain well.  He only infrequently requires oxycodone.  Patient endorses occasional depressive symptoms.   He is on Wellbutrin, primarily to help him with smoking cessation.  He says that he has occasionally thought about just quitting all treatment but understands the significance of that decision.  He says that he often talks on the phone with friends (despite dysarthria). Will refer to SW and [facility].   PLAN: -Continue current scope of treatment -Referral to SW and [facility] -Follow up telephone visit 1 month   Patient expressed understanding and was in agreement with this plan. He also understands that He can call the clinic at any time with any questions, concerns, or complaints.     Time Total: 20 minutes  Visit consisted of counseling and education dealing with the complex and emotionally intense issues of symptom management and palliative care in the setting of serious and potentially life-threatening illness.Greater than 50%  of this time was spent counseling and coordinating care related to the above assessment and plan.  Signed by: Laurette Schimke, PhD, NP-C

## 2023-06-15 NOTE — Patient Instructions (Signed)
[facility] - A DEPT OF [facility]  Discharge Instructions: Thank you for choosing [facility] to provide your oncology and hematology care.  If you have a lab appointment with the Cancer Center, please go directly to the Cancer Center and check in at the registration area.  Wear comfortable clothing and clothing appropriate for easy access to any Portacath or PICC line.   We strive to give you quality time with your provider. You may need to reschedule your appointment if you arrive late (15 or more minutes).  Arriving late affects you and other patients whose appointments are after yours.  Also, if you miss three or more appointments without notifying the office, you may be dismissed from the clinic at the provider's discretion.      For prescription refill requests, have your pharmacy contact our office and allow 72 hours for refills to be completed.    Today you received the following chemotherapy and/or immunotherapy agents- cisplatin      To help prevent nausea and vomiting after your treatment, we encourage you to take your nausea medication as directed.  BELOW ARE SYMPTOMS THAT SHOULD BE REPORTED IMMEDIATELY: *FEVER GREATER THAN 100.4 F (38 C) OR HIGHER *CHILLS OR SWEATING *NAUSEA AND VOMITING THAT IS NOT CONTROLLED WITH YOUR NAUSEA MEDICATION *UNUSUAL SHORTNESS OF BREATH *UNUSUAL BRUISING OR BLEEDING *URINARY PROBLEMS (pain or burning when urinating, or frequent urination) *BOWEL PROBLEMS (unusual diarrhea, constipation, pain near the anus) TENDERNESS IN MOUTH AND THROAT WITH OR WITHOUT PRESENCE OF ULCERS (sore throat, sores in mouth, or a toothache) UNUSUAL RASH, SWELLING OR PAIN  UNUSUAL VAGINAL DISCHARGE OR ITCHING   Items with * indicate a potential emergency and should be followed up as soon as possible or go to the Emergency Department if any problems should occur.  Please show the CHEMOTHERAPY ALERT CARD or IMMUNOTHERAPY  ALERT CARD at check-in to the Emergency Department and triage nurse.  Should you have questions after your visit or need to cancel or reschedule your appointment, please contact [facility] - A DEPT OF [facility]  [phone] and follow the prompts.  Office hours are 8:00 a.m. to 4:30 p.m. Monday - Friday. Please note that voicemails left after 4:00 p.m. may not be returned until the following business day.  We are closed weekends and major holidays. You have access to a nurse at all times for urgent questions. Please call the main number to the clinic [phone] and follow the prompts.  For any non-urgent questions, you may also contact your provider using MyChart. We now offer e-Visits for anyone 42 and older to request care online for non-urgent symptoms. For details visit [email or web address].   Also download the MyChart app! Go to the app store, search "MyChart", open the app, select [facility], and log in with your MyChart username and password.

## 2023-06-15 NOTE — Progress Notes (Signed)
[facility] Clinical Social Work  Initial Assessment   Shane Perry is a 67 y.o. year old male contacted by phone. Clinical Social Work was referred by medical provider for assessment of psychosocial needs.   SDOH (Social Determinants of Health) assessments performed: Yes   SDOH Screenings   Food Insecurity: Medium Risk (03/21/2023)   Received from [facility]  Transportation Needs: No Transportation Needs (03/21/2023)   Received from [facility]  Utilities: Low Risk  (03/21/2023)   Received from [facility]  Recent Concern: Utilities - At Risk (02/18/2023)   Received from [facility]  Depression (PHQ2-9): Low Risk  (02/01/2023)  Financial Resource Strain: Patient Declined (02/18/2023)   Received from [facility]  Physical Activity: Insufficiently Active (02/18/2023)   Received from [facility]  Social Connections: Moderately Integrated (02/18/2023)   Received from [facility]  Stress: No Stress Concern Present (02/18/2023)   Received from [facility]  Tobacco Use: High Risk (06/13/2023)     Distress Screen completed: No     No data to display            Family/Social Information:  Housing Arrangement: patient lives alone Family members/support persons in your life? Family and Friends Transportation concerns: no  Employment: Unemployed.  Patient was a truck Hospital doctor. Income source: Actor concerns: Yes, due to illness and/or loss of work during treatment Type of concern:  General Food access concerns: no Religious or spiritual practice: Yes-Patient identifies as Engineer, manufacturing systems Currently in place:  [employer]  Coping/ Adjustment to diagnosis: Patient understands treatment plan and what happens next? Yes.  Patient stated he gets sad at times but has no plans to harm himself or others. 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  Clinical Social Work Clinical Goal(s):  Explore community resource options for unmet needs related to:  Financial Strain   Interventions: Discussed common feeling and emotions when being diagnosed with cancer, and the importance of support during treatment Informed patient of the support team roles and support services at [facility] Provided CSW contact information and encouraged patient to call with any questions or concerns Provided patient with information about CSW role and opportunities for counseling.   Follow Up Plan: Meet with patient next week after radiation treatment. Patient verbalizes understanding of plan: Yes    Pascal Lux Deshanna Kama, LCSW Clinical Social Worker [facility]

## 2023-06-17 ENCOUNTER — Ambulatory Visit: Payer: 59

## 2023-06-20 ENCOUNTER — Ambulatory Visit
Admission: RE | Admit: 2023-06-20 | Discharge: 2023-06-20 | Disposition: A | Discharge status: Home / Self Care | Payer: 59 | Source: Ambulatory Visit | Attending: Radiation Oncology | Admitting: Radiation Oncology

## 2023-06-20 ENCOUNTER — Other Ambulatory Visit: Payer: Self-pay

## 2023-06-20 ENCOUNTER — Ambulatory Visit: Payer: 59

## 2023-06-20 ENCOUNTER — Inpatient Hospital Stay: Payer: 59 | Attending: Oncology

## 2023-06-20 DIAGNOSIS — C770 Secondary and unspecified malignant neoplasm of lymph nodes of head, face and neck: Secondary | ICD-10-CM | POA: Insufficient documentation

## 2023-06-20 DIAGNOSIS — I10 Essential (primary) hypertension: Secondary | ICD-10-CM | POA: Insufficient documentation

## 2023-06-20 DIAGNOSIS — E119 Type 2 diabetes mellitus without complications: Secondary | ICD-10-CM | POA: Insufficient documentation

## 2023-06-20 DIAGNOSIS — E785 Hyperlipidemia, unspecified: Secondary | ICD-10-CM | POA: Insufficient documentation

## 2023-06-20 DIAGNOSIS — Z87891 Personal history of nicotine dependence: Secondary | ICD-10-CM | POA: Insufficient documentation

## 2023-06-20 DIAGNOSIS — C023 Malignant neoplasm of anterior two-thirds of tongue, part unspecified: Secondary | ICD-10-CM | POA: Insufficient documentation

## 2023-06-20 DIAGNOSIS — C069 Malignant neoplasm of mouth, unspecified: Secondary | ICD-10-CM | POA: Insufficient documentation

## 2023-06-20 DIAGNOSIS — Z7984 Long term (current) use of oral hypoglycemic drugs: Secondary | ICD-10-CM | POA: Insufficient documentation

## 2023-06-20 DIAGNOSIS — Z79899 Other long term (current) drug therapy: Secondary | ICD-10-CM | POA: Insufficient documentation

## 2023-06-20 DIAGNOSIS — Z87442 Personal history of urinary calculi: Secondary | ICD-10-CM | POA: Insufficient documentation

## 2023-06-20 DIAGNOSIS — Z5111 Encounter for antineoplastic chemotherapy: Secondary | ICD-10-CM | POA: Insufficient documentation

## 2023-06-20 DIAGNOSIS — Z7952 Long term (current) use of systemic steroids: Secondary | ICD-10-CM | POA: Insufficient documentation

## 2023-06-20 DIAGNOSIS — Z7982 Long term (current) use of aspirin: Secondary | ICD-10-CM | POA: Insufficient documentation

## 2023-06-20 LAB — RAD ONC ARIA SESSION SUMMARY
Course Elapsed Days: 53
Plan Fractions Treated to Date: 30
Plan Prescribed Dose Per Fraction: 2 Gy
Plan Total Fractions Prescribed: 35
Plan Total Prescribed Dose: 70 Gy
Reference Point Dosage Given to Date: 60 Gy
Reference Point Session Dosage Given: 2 Gy
Session Number: 30

## 2023-06-20 NOTE — Progress Notes (Signed)
[facility] CSW Progress Note  Visual merchandiser met with patient after his treatment to introduce self and to give him contact information.  Briefly discussed his military background.  Provided additional information regarding CSW role.    Dorothey Baseman, LCSW Clinical Social Worker [facility]

## 2023-06-21 ENCOUNTER — Ambulatory Visit: Payer: 59

## 2023-06-21 ENCOUNTER — Ambulatory Visit
Admission: RE | Admit: 2023-06-21 | Discharge: 2023-06-21 | Disposition: A | Discharge status: Home / Self Care | Payer: 59 | Source: Ambulatory Visit | Attending: Radiation Oncology | Admitting: Radiation Oncology

## 2023-06-21 ENCOUNTER — Other Ambulatory Visit: Payer: Self-pay | Admitting: *Deleted

## 2023-06-21 ENCOUNTER — Other Ambulatory Visit: Payer: Self-pay

## 2023-06-21 DIAGNOSIS — Z5111 Encounter for antineoplastic chemotherapy: Secondary | ICD-10-CM | POA: Diagnosis not present

## 2023-06-21 LAB — RAD ONC ARIA SESSION SUMMARY
Course Elapsed Days: 54
Plan Fractions Treated to Date: 31
Plan Prescribed Dose Per Fraction: 2 Gy
Plan Total Fractions Prescribed: 35
Plan Total Prescribed Dose: 70 Gy
Reference Point Dosage Given to Date: 62 Gy
Reference Point Session Dosage Given: 2 Gy
Session Number: 31

## 2023-06-21 MED ORDER — SUCRALFATE 1 G PO TABS: 1.0000 g | ORAL_TABLET | Freq: Three times a day (TID) | ORAL | 3 refills | Status: DC

## 2023-06-21 MED FILL — Fosaprepitant Dimeglumine For IV Infusion 150 MG (Base Eq): INTRAVENOUS | Qty: 5 | Status: AC

## 2023-06-22 ENCOUNTER — Encounter: Payer: Self-pay | Admitting: Oncology

## 2023-06-22 ENCOUNTER — Inpatient Hospital Stay: Payer: 59

## 2023-06-22 ENCOUNTER — Inpatient Hospital Stay (HOSPITAL_BASED_OUTPATIENT_CLINIC_OR_DEPARTMENT_OTHER): Payer: 59 | Admitting: Oncology

## 2023-06-22 ENCOUNTER — Ambulatory Visit: Payer: 59

## 2023-06-22 ENCOUNTER — Other Ambulatory Visit: Payer: 59

## 2023-06-22 ENCOUNTER — Other Ambulatory Visit: Payer: Self-pay

## 2023-06-22 ENCOUNTER — Ambulatory Visit: Payer: 59 | Admitting: Oncology

## 2023-06-22 ENCOUNTER — Ambulatory Visit
Admission: RE | Admit: 2023-06-22 | Discharge: 2023-06-22 | Disposition: A | Discharge status: Home / Self Care | Payer: 59 | Source: Ambulatory Visit | Attending: Radiation Oncology | Admitting: Radiation Oncology

## 2023-06-22 VITALS — BP 90/56 | HR 99 | Temp 96.0°F | Resp 17 | Wt 153.0 lb

## 2023-06-22 DIAGNOSIS — Z5111 Encounter for antineoplastic chemotherapy: Secondary | ICD-10-CM

## 2023-06-22 DIAGNOSIS — C029 Malignant neoplasm of tongue, unspecified: Secondary | ICD-10-CM

## 2023-06-22 LAB — BASIC METABOLIC PANEL - CANCER CENTER ONLY
Anion gap: 11 (ref 5–15)
BUN: 17 mg/dL (ref 8–23)
CO2: 31 mmol/L (ref 22–32)
Calcium: 9.6 mg/dL (ref 8.9–10.3)
Chloride: 88 mmol/L — ABNORMAL LOW (ref 98–111)
Creatinine: 0.53 mg/dL — ABNORMAL LOW (ref 0.61–1.24)
GFR, Estimated: >60 mL/min (ref >60)
Glucose, Bld: 204 mg/dL — ABNORMAL HIGH (ref 70–99)
Potassium: 4.4 mmol/L (ref 3.5–5.1)
Sodium: 130 mmol/L — ABNORMAL LOW (ref 135–145)

## 2023-06-22 LAB — CBC WITH DIFFERENTIAL (CANCER CENTER ONLY)
Abs Immature Granulocytes: 0.03 10*3/uL (ref 0.00–0.07)
Basophils Absolute: 0 10*3/uL (ref 0.0–0.1)
Basophils Relative: 0 %
Eosinophils Absolute: 0 10*3/uL (ref 0.0–0.5)
Eosinophils Relative: 1 %
HCT: 37.6 % — ABNORMAL LOW (ref 39.0–52.0)
Hemoglobin: 13 g/dL (ref 13.0–17.0)
Immature Granulocytes: 0 %
Lymphocytes Relative: 3 %
Lymphs Abs: 0.3 10*3/uL — ABNORMAL LOW (ref 0.7–4.0)
MCH: 30.7 pg (ref 26.0–34.0)
MCHC: 34.6 g/dL (ref 30.0–36.0)
MCV: 88.7 fL (ref 80.0–100.0)
Monocytes Absolute: 0.6 10*3/uL (ref 0.1–1.0)
Monocytes Relative: 8 %
Neutro Abs: 7.4 10*3/uL (ref 1.7–7.7)
Neutrophils Relative %: 88 %
Platelet Count: 286 10*3/uL (ref 150–400)
RBC: 4.24 MIL/uL (ref 4.22–5.81)
RDW: 15.1 % (ref 11.5–15.5)
WBC Count: 8.4 10*3/uL (ref 4.0–10.5)
nRBC: 0 % (ref 0.0–0.2)

## 2023-06-22 LAB — RAD ONC ARIA SESSION SUMMARY
Course Elapsed Days: 55
Plan Fractions Treated to Date: 32
Plan Prescribed Dose Per Fraction: 2 Gy
Plan Total Fractions Prescribed: 35
Plan Total Prescribed Dose: 70 Gy
Reference Point Dosage Given to Date: 64 Gy
Reference Point Session Dosage Given: 2 Gy
Session Number: 32

## 2023-06-22 LAB — MAGNESIUM: Magnesium: 1.8 mg/dL (ref 1.7–2.4)

## 2023-06-22 MED ORDER — STERILE WATER FOR INJECTION IJ SOLN
OROMUCOSAL | 3 refills | Status: DC
  Filled 2023-06-22: qty 480, 12d supply, fill #0

## 2023-06-22 MED ORDER — DEXAMETHASONE SODIUM PHOSPHATE 10 MG/ML IJ SOLN
4.0000 mg | Freq: Once | INTRAMUSCULAR | Status: AC
  Administered 2023-06-22: 4 mg via INTRAVENOUS
  Filled 2023-06-22: qty 1

## 2023-06-22 MED ORDER — POTASSIUM CHLORIDE IN NACL 20-0.9 MEQ/L-% IV SOLN
Freq: Once | INTRAVENOUS | Status: AC
  Filled 2023-06-22: qty 1000

## 2023-06-22 MED ORDER — SODIUM CHLORIDE 0.9 % IV SOLN
150.0000 mg | Freq: Once | INTRAVENOUS | Status: AC
  Administered 2023-06-22: 150 mg via INTRAVENOUS
  Filled 2023-06-22: qty 5
  Filled 2023-06-22: qty 150
  Filled 2023-06-22: qty 5

## 2023-06-22 MED ORDER — HEPARIN SOD (PORK) LOCK FLUSH 100 UNIT/ML IV SOLN
500.0000 [IU] | Freq: Once | INTRAVENOUS | Status: AC | PRN
  Administered 2023-06-22: 500 [IU]
  Filled 2023-06-22: qty 5

## 2023-06-22 MED ORDER — SODIUM CHLORIDE 0.9 % IV SOLN
40.0000 mg/m2 | Freq: Once | INTRAVENOUS | Status: AC
  Administered 2023-06-22: 77 mg via INTRAVENOUS
  Filled 2023-06-22: qty 77

## 2023-06-22 MED ORDER — PALONOSETRON HCL INJECTION 0.25 MG/5ML
0.2500 mg | Freq: Once | INTRAVENOUS | Status: AC
  Administered 2023-06-22: 0.25 mg via INTRAVENOUS
  Filled 2023-06-22: qty 5

## 2023-06-22 MED ORDER — SODIUM CHLORIDE 0.9 % IV SOLN
Freq: Once | INTRAVENOUS | Status: AC
Start: 2023-06-22 — End: 2023-06-22
  Filled 2023-06-22: qty 250

## 2023-06-22 MED ORDER — MAGNESIUM SULFATE 2 GM/50ML IV SOLN
2.0000 g | Freq: Once | INTRAVENOUS | Status: AC
  Administered 2023-06-22: 2 g via INTRAVENOUS
  Filled 2023-06-22: qty 50

## 2023-06-22 NOTE — Progress Notes (Signed)
Nutrition Follow-up:  Patient with SCC of oral cavity/oral tongue, stage IV.  S/p glossectomy with flap reconstruction at [facility].  Open G-tube performed on 10/22 at [facility].  Patient receiving radiation and cisplatin  Met with patient during infusion. Says that today is his last chemotherapy. Patient reports not being able to eat orally (no taste, difficulty swallowing).  Reports that he continues to give 6 cartons of osmolite 1.5 via tube and additional equate plus (2 a day).  He is giving 6-8 cartons of nutrition via tube daily. Tolerating tube feeding well.  Reports that he is adding 1/2 tsp of salt to tube feeding daily (1150mg  sodium)   Medications: reviewed  Labs: Na 130, glucose 204, creatinine 0.53  Anthropometrics:   Weight 153 lb today  157 lb on 11/27 154 lb on 10/30 154 lb on 10/31 159 lb on 10/9 153 lb on 9/26 219 lb on March 2023   Estimated Energy Needs  Kcals: 2200-2500 Protein: 100-125 g Fluid: > 2.2 L  NUTRITION DIAGNOSIS: Unintentional weight loss stable    INTERVENTION:  Continue at least 6 cartons of osmolite 1.5 daily via PEG tube.  Can add additional equate plus cartons for added nutrition (350 calories/bottle)      MONITORING, EVALUATION, GOAL: weight trends, tube feeding   NEXT VISIT: Wed, Dec 18 phone call  Shane Perry, RD, LDN Registered Dietitian [phone]

## 2023-06-22 NOTE — Progress Notes (Signed)
Patient here for oncology follow-up appointment, concerns of recent fall

## 2023-06-22 NOTE — Progress Notes (Signed)
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  He was subsequently seen by Dr. Lou Cal from ENT surgery at [facility] and underwent CT soft tissue neck as well as CT chest.  CT soft tissue neck showed ulcerated lesion involving the anterior lateral tongue measuring 1 x 3.4 cm in the axial plane.  1.9 cm in the coronal plane.  Relative sparing of the tongue base and floor of mouth.  Parapharyngeal and retropharyngeal spaces appear intact.  Small subcentimeter lymph nodes within bilateral neck which were nonspecific.  CT chest showed subcentimeter lung nodules that were nonspecific.  He had a biopsy of this tongue mass which was consistent with squamous cell carcinoma moderately differentiated.    He was seen by [facility] ENT and underwent glossectomy and modified neck dissection on 03/14/2023 with tracheostomy and NG tube placement. Final Diagnosis        A. 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   B. LEFT SUBMANDIBULAR CONTENTS, DISSECTION:               Benign submandibular gland; negative for carcinoma.               Three benign lymph nodes, negative for metastatic carcinoma (0/3).               C. SUBMENTAL CONTENTS, DISSECTION:               Four benign lymph nodes, negative for metastatic carcinoma (0/4).   D. RIGHT SUBMANDIBULAR CONTENTS, EXCISION:               Benign submandibular tissue.              Negative for carcinoma.   E. PAROTID GLAND, LEFT TAIL, EXCISION:               [place] tumor (1.1 cm); the tumor appears completely excised.              Negative for carcinoma.              Four benign lymph nodes, negative for metastatic carcinoma (0/4).   F. LEFT NECK CONTENTS LEVELS 2A, 3, 4 AND 5, DISSECTION:               Eight out of thirty-one lymph nodes, positive for metastatic squamous cell carcinoma (8/31).                           Size of largest nodal metastatic deposit: 3.3 cm. Extranodal extension: Identified. Benign salivary gland tissue; negative for malignancy.  G. LEFT NECK CONTENTS LEVELS 2B, DISSECTION:               Nine benign lymph nodes, negative for metastatic carcinoma (0/9).    H. NODULE AT LEFT CAROTID BULB, EXCISION:               One benign lymph node, positive for metastatic squamous cell carcinoma (1/1). Size of metastatic deposit: 0.4 cm. Extranodal extension: Not identified.   I. TONGUE, LEFT, GLOSSECTOMY  :               Invasive squamous cell carcinoma, keratinizing, poorly differentiated with tumor necrosis.                           Tumor size: 5.0 cm in greatest dimension.                           Depth of invasion: 12 mm                           Lymphovascular and perineural invasion: Identified.                           Margins: Negative                           Pathologic stage: pT4a, pN3b.                           Please see CAP synoptic report.               J. TEETH 1,2,14,15, and 19, EXTRACTION:               Teeth (gross only)   Plan is for adjuvant concurrent chemoradiation with weekly Cisplatin.  PET/CT scan in  September 2024 showed hypermetabolic right cervical right supraclavicular adenopathy but no other evidence of distant metastatic disease.      Interval history-he does complain of some mild pain but is able to continue with his nutrition through PEG tube feeds.  ECOG PS- 1 Pain scale- 3 Opioid associated constipation- no  Review of systems- Review of Systems  Constitutional:  Negative for chills, fever, malaise/fatigue and weight loss.  HENT:  Negative for congestion, ear discharge and nosebleeds.   Eyes:  Negative for blurred vision.  Respiratory:  Negative for cough, hemoptysis, sputum production, shortness of breath and wheezing.   Cardiovascular:  Negative for chest pain, palpitations, orthopnea and claudication.  Gastrointestinal:  Negative for abdominal pain, blood in stool, constipation, diarrhea, heartburn, melena, nausea and vomiting.  Genitourinary:  Negative for dysuria, flank pain, frequency, hematuria and urgency.  Musculoskeletal:  Negative for back pain, joint pain and myalgias.  Skin:  Negative for rash.  Neurological:  Negative for dizziness, tingling, focal weakness, seizures, weakness and headaches.  Endo/Heme/Allergies:  Does not bruise/bleed easily.  Psychiatric/Behavioral:  Negative for depression and suicidal ideas. The patient does not have insomnia.       Allergies  Allergen Reactions   Statins Other (See Comments)    Loss of balance   Varenicline Anxiety and Other (See Comments)    violent  Violent, agitation   Lisinopril Other (See Comments)    hoarseness     Past Medical History:  Diagnosis Date   Cancer (  HCC)    Diabetes mellitus without complication (HCC)    Diabetic polyneuropathy associated with type 2 diabetes mellitus (HCC)    History of kidney stones    Hyperlipidemia    Hypertension    Kidney stones    Onychomycosis    Panlobular emphysema (HCC)    Squamous cell carcinoma, tongue border (HCC)    Tongue cancer (HCC)    Weight loss       Past Surgical History:  Procedure Laterality Date   COLONOSCOPY     COLONOSCOPY WITH PROPOFOL N/A 04/26/2023   Procedure: COLONOSCOPY WITH PROPOFOL;  Surgeon: [name], MD;  Location: [facility] ENDOSCOPY;  Service: Endoscopy;  Laterality: N/A;   CYST REMOVAL NECK     GASTROSTOMY N/A 05/10/2023   Procedure: INSERTION OF GASTROSTOMY TUBE, open;  Surgeon: Henrene Dodge, MD;  Location: [facility] ORS;  Service: General;  Laterality: N/A;   IR IMAGING GUIDED PORT INSERTION  04/14/2023   left arm muscle removal for tongue     partial tongue removal  03/14/2023   PORTA CATH INSERTION Right    tongue cancer     TRACHEOSTOMY  03/14/2023    Social History   Socioeconomic History   Marital status: Divorced    Spouse name: Not on file   Number of children: Not on file   Years of education: Not on file   Highest education level: Not on file  Occupational History   Not on file  Tobacco Use   Smoking status: Every Day    Current packs/day: 1.00    Average packs/day: 1 pack/day for 50.0 years (50.0 ttl pk-yrs)    Types: Cigarettes    Passive exposure: Past   Smokeless tobacco: Never  Vaping Use   Vaping status: Never Used  Substance and Sexual Activity   Alcohol use: Not Currently    Alcohol/week: 1.0 standard drink of alcohol    Types: 1 Cans of beer per week    Comment: Rare   Drug use: Never   Sexual activity: Not on file  Other Topics Concern   Not on file  Social History Narrative   Not on file   Social Determinants of Health   Financial Resource Strain: Patient Declined (02/18/2023)   Received from [facility]   Overall Financial Resource Strain (CARDIA)    Difficulty of Paying Living Expenses: Patient declined  Food Insecurity: Medium Risk (03/21/2023)   Received from [facility]   Hunger Vital Sign    Ran Out of Food in the Last Year: Never true    Worried About Running Out of Food in the Last Year: Sometimes true  Transportation Needs: No Transportation Needs  (03/21/2023)   Received from [facility]    In the past 12 months, has lack of reliable transportation kept you from medical appointments, meetings, work or from getting things needed for daily living? : No  Physical Activity: Insufficiently Active (02/18/2023)   Received from [facility]   Exercise Vital Sign    Days of Exercise per Week: 3 days    Minutes of Exercise per Session: 30 min  Stress: No Stress Concern Present (02/18/2023)   Received from [facility] Occupational [facility] - Occupational Stress Questionnaire    Feeling of Stress : Only a little  Social Connections: Moderately Integrated (02/18/2023)   Received from [facility]   Social Network    How would you rate your social network (family, work, friends)?: Adequate participation with  social networks  Intimate Partner Violence: Not At Risk (02/18/2023)   Received from [facility]   HITS    Over the last 12 months how often did your partner physically hurt you?: Never    Over the last 12 months how often did your partner insult you or talk down to you?: Never    Over the last 12 months how often did your partner threaten you with physical harm?: Never    Over the last 12 months how often did your partner scream or curse at you?: Never    Family History  Problem Relation Age of Onset   Hypertension Mother    Alzheimer's disease Mother    Alzheimer's disease Father    Heart disease Sister    Diabetes type I Brother      Current Outpatient Medications:    acetaminophen (TYLENOL) 160 MG/5ML solution, Take 10 mLs (320 mg total) by mouth every 6 (six) hours as needed for moderate pain (pain score 4-6)., Disp: 473 mL, Rfl: 1   aspirin 81 MG chewable tablet, Chew 81 mg by mouth daily., Disp: , Rfl:    Blood Glucose Monitoring Suppl (ONETOUCH VERIO FLEX SYSTEM) w/Device KIT, by Does not apply route., Disp: , Rfl:    buPROPion (WELLBUTRIN SR) 150 MG 12 hr tablet, Take 150 mg by mouth  daily., Disp: , Rfl:    dexamethasone (DECADRON) 4 MG tablet, Take 2 tablets (8 mg) by mouth daily x 3 days starting the day after cisplatin chemotherapy. Take with food., Disp: 30 tablet, Rfl: 1   glucose blood test strip, , Disp: , Rfl:    insulin regular (NOVOLIN R) 100 units/mL injection, Inject into the skin 3 (three) times daily before meals., Disp: , Rfl:    lidocaine-prilocaine (EMLA) cream, Apply to affected area once, Disp: 30 g, Rfl: 3   Nutritional Supplements (FEEDING SUPPLEMENT, OSMOLITE 1.5 CAL,) LIQD, Give 1.5 cartons 4 times a day via feeding tube.  Flush with 60ml of water before and after each feeding. Provide additional 3 cups ( ) via feeding tube or orally to meet hydration needs.   Needs bolus supplies for tube feeding., Disp: , Rfl:    ondansetron (ZOFRAN) 8 MG tablet, Take 1 tablet (8 mg total) by mouth every 8 (eight) hours as needed for nausea or vomiting. Start on the third day after cisplatin., Disp: 30 tablet, Rfl: 1   oxyCODONE (ROXICODONE) 5 MG/5ML solution, Take 5 mLs (5 mg total) by mouth every 6 (six) hours as needed for severe pain (pain score 7-10)., Disp: 140 mL, Rfl: 0   prochlorperazine (COMPAZINE) 10 MG tablet, Take 1 tablet (10 mg total) by mouth every 6 (six) hours as needed (Nausea or vomiting)., Disp: 30 tablet, Rfl: 1   sucralfate (CARAFATE) 1 g tablet, Take 1 tablet (1 g total) by mouth 4 (four) times daily -  with meals and at bedtime., Disp: 120 tablet, Rfl: 3  Physical exam: There were no vitals filed for this visit. Physical Exam HENT:     Mouth/Throat:     Comments: Few scattered ulcerations noted Cardiovascular:     Rate and Rhythm: Normal rate and regular rhythm.     Heart sounds: Normal heart sounds.  Pulmonary:     Effort: Pulmonary effort is normal.     Breath sounds: Normal breath sounds.  Skin:    General: Skin is warm and dry.  Neurological:     Mental Status: He is alert and oriented to person, place, and  time.          Latest Ref Rng & Units 06/15/2023    8:31 AM  CMP  Glucose 70 - 99 mg/dL 811   BUN 8 - 23 mg/dL 17   Creatinine 9.14 - 1.24 mg/dL 7.82   Sodium 956 - 213 mmol/L 127   Potassium 3.5 - 5.1 mmol/L 4.4   Chloride 98 - 111 mmol/L 87   CO2 22 - 32 mmol/L 32   Calcium 8.9 - 10.3 mg/dL 9.0       Latest Ref Rng & Units 06/15/2023    8:31 AM  CBC  WBC 4.0 - 10.5 K/uL 8.0   Hemoglobin 13.0 - 17.0 g/dL 08.6   Hematocrit 57.8 - 52.0 % 35.7   Platelets 150 - 400 K/uL 193      Assessment and plan- Patient is a 67 y.o. 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  He is here for on treatment assessment prior to cycle 5 of adjuvant cisplatin chemotherapy concurrent with radiation  Patient has missed at least 2 doses of weekly chemotherapy once because he did not realize he was supposed to come for his treatment and in the other instance he was having more throat pain and therefore his radiation was paused.  We will proceed with cycle 5 of weekly cisplatin chemotherapy today.  He completes radiation treatment next week and I will plan to give him cycle 6 of cisplatin next week as well.  Will plan to repeat PET CT scan in 10 weeks time and I will see him following PET scan  He will be seen by covering NP in about 3 to 4 weeks with labs   Visit Diagnosis 1. Encounter for antineoplastic chemotherapy   2. Tongue cancer (HCC)      Dr. Owens Shark, MD, MPH [facility] at [facility] [phone] 06/22/2023 8:43 AM

## 2023-06-22 NOTE — Patient Instructions (Signed)

## 2023-06-23 ENCOUNTER — Other Ambulatory Visit: Payer: Self-pay

## 2023-06-23 ENCOUNTER — Ambulatory Visit
Admission: RE | Admit: 2023-06-23 | Discharge: 2023-06-23 | Disposition: A | Discharge status: Home / Self Care | Payer: 59 | Source: Ambulatory Visit | Attending: Radiation Oncology | Admitting: Radiation Oncology

## 2023-06-23 DIAGNOSIS — Z5111 Encounter for antineoplastic chemotherapy: Secondary | ICD-10-CM | POA: Diagnosis not present

## 2023-06-23 LAB — RAD ONC ARIA SESSION SUMMARY
Course Elapsed Days: 56
Plan Fractions Treated to Date: 33
Plan Prescribed Dose Per Fraction: 2 Gy
Plan Total Fractions Prescribed: 35
Plan Total Prescribed Dose: 70 Gy
Reference Point Dosage Given to Date: 66 Gy
Reference Point Session Dosage Given: 2 Gy
Session Number: 33

## 2023-06-24 ENCOUNTER — Ambulatory Visit
Admission: RE | Admit: 2023-06-24 | Discharge: 2023-06-24 | Disposition: A | Discharge status: Home / Self Care | Payer: 59 | Source: Ambulatory Visit | Attending: Radiation Oncology | Admitting: Radiation Oncology

## 2023-06-24 ENCOUNTER — Ambulatory Visit: Payer: 59

## 2023-06-24 ENCOUNTER — Other Ambulatory Visit: Payer: Self-pay

## 2023-06-24 DIAGNOSIS — Z5111 Encounter for antineoplastic chemotherapy: Secondary | ICD-10-CM | POA: Diagnosis not present

## 2023-06-24 LAB — RAD ONC ARIA SESSION SUMMARY
Course Elapsed Days: 57
Plan Fractions Treated to Date: 34
Plan Prescribed Dose Per Fraction: 2 Gy
Plan Total Fractions Prescribed: 35
Plan Total Prescribed Dose: 70 Gy
Reference Point Dosage Given to Date: 68 Gy
Reference Point Session Dosage Given: 2 Gy
Session Number: 34

## 2023-06-27 ENCOUNTER — Other Ambulatory Visit: Payer: Self-pay

## 2023-06-27 ENCOUNTER — Ambulatory Visit: Payer: 59

## 2023-06-27 ENCOUNTER — Ambulatory Visit
Admission: RE | Admit: 2023-06-27 | Discharge: 2023-06-27 | Disposition: A | Discharge status: Home / Self Care | Payer: 59 | Source: Ambulatory Visit | Attending: Radiation Oncology | Admitting: Radiation Oncology

## 2023-06-27 DIAGNOSIS — Z5111 Encounter for antineoplastic chemotherapy: Secondary | ICD-10-CM | POA: Diagnosis not present

## 2023-06-27 LAB — RAD ONC ARIA SESSION SUMMARY
Course Elapsed Days: 60
Plan Fractions Treated to Date: 35
Plan Prescribed Dose Per Fraction: 2 Gy
Plan Total Fractions Prescribed: 35
Plan Total Prescribed Dose: 70 Gy
Reference Point Dosage Given to Date: 70 Gy
Reference Point Session Dosage Given: 2 Gy
Session Number: 35

## 2023-06-28 ENCOUNTER — Telehealth: Payer: Self-pay | Admitting: *Deleted

## 2023-06-28 ENCOUNTER — Other Ambulatory Visit: Payer: Self-pay | Admitting: *Deleted

## 2023-06-28 ENCOUNTER — Ambulatory Visit: Payer: 59

## 2023-06-28 DIAGNOSIS — C029 Malignant neoplasm of tongue, unspecified: Secondary | ICD-10-CM

## 2023-06-28 MED FILL — Fosaprepitant Dimeglumine For IV Infusion 150 MG (Base Eq): INTRAVENOUS | Qty: 5 | Status: AC

## 2023-06-28 NOTE — Telephone Encounter (Signed)
Received Accomodation form from patient employer. Form completed and sent for physician signature

## 2023-06-28 NOTE — Telephone Encounter (Addendum)
Form signed and faxed back to [employer], Confirmation of receipt obtained and patient notified via MyChart message

## 2023-06-28 NOTE — Radiation Completion Notes (Signed)
Patient Name: Shane Perry, Shane Perry MRN: [id number] Date of Birth: 11/18/1955 Referring Physician: Patsey Berthold, M.D. Date of Service: 2023-06-28 Radiation Oncologist: Carmina Miller, M.D. [facility]                             RADIATION ONCOLOGY END OF TREATMENT NOTE     Diagnosis: C02.3 Malignant neoplasm of anterior two-thirds of tongue, part unspecified Staging on 2023-03-29: Tongue cancer (HCC) T=pT4a, N=pN3b, M=cM0 Staging on 2023-02-01: Tongue cancer (HCC) T=cT2, N=cN0, M=cM0 Intent: Curative     HPI: Patient is a 67 year old male who bit his tongue and saw an ENT surgeon.  CT scan of his neck showed an ulcerated lesion in the anterior lateral tongue measuring 1 x 3.4 cm x 1.9 cm.  He CT scan of chest showed small lung nodules nonspecific.  Biopsy of his tongue was positive for moderately differentiated squamous cell carcinoma.  Patient underwent partial glossectomy modified neck dissection on 03/14/2023.  He had metastatic squamous cell carcinoma in 1 pretracheal lymph node.  He also had 8 out of 31 lymph nodes positive for metastatic squamous cell carcinoma the largest being 3.3 cm with extranodal extension in the left neck levels 2A34 and 5.  Also had a left carotid bulb lymph node positive for squamous cell carcinoma.  The tongue had a 5 cm invasive poorly differentiated squamous cell carcinoma with lymph-vascular perineural invasion positive.  Margins were negative.  Patient is slowly recovering from his surgery.  He is swallowing somewhat.  He is having no head and neck pain at this time.  He has been seen by medical oncology with recommendation for concurrent chemoradiation is seen for radiation oncology opinion.  He had a PET CT scan ordered recently showing a my opinion hypermetabolic activity in the right high cervical chain.  No other evidence of residual disease is noted.  No formal reading of the PET scan has been performed at this time       ==========DELIVERED PLANS==========  First Treatment Date: 2023-04-28 Last Treatment Date: 2023-06-27   Plan Name: HN_Tongue Site: Tongue Technique: IMRT Mode: Photon Dose Per Fraction: 2 Gy Prescribed Dose (Delivered / Prescribed): 70 Gy / 70 Gy Prescribed Fxs (Delivered / Prescribed): 35 / 35     ==========ON TREATMENT VISIT DATES========== 2023-05-03, 2023-05-16, 2023-05-19, 2023-05-24, 2023-05-31, 2023-06-07, 2023-06-14, 2023-06-21     ==========UPCOMING VISITS==========       ==========APPENDIX - ON TREATMENT VISIT NOTES==========   See weekly On Treatment Notes in Epic for details in the Media tab (listed as Progress notes on the On Treatment Visit Dates listed above).

## 2023-06-29 ENCOUNTER — Other Ambulatory Visit: Payer: 59

## 2023-06-29 ENCOUNTER — Inpatient Hospital Stay: Payer: 59

## 2023-06-29 ENCOUNTER — Ambulatory Visit: Payer: 59

## 2023-06-29 ENCOUNTER — Other Ambulatory Visit: Payer: Self-pay

## 2023-06-29 ENCOUNTER — Ambulatory Visit: Payer: 59 | Admitting: Oncology

## 2023-06-29 VITALS — BP 109/60 | HR 80 | Temp 96.0°F | Resp 17 | Wt 153.2 lb

## 2023-06-29 DIAGNOSIS — Z5111 Encounter for antineoplastic chemotherapy: Secondary | ICD-10-CM | POA: Diagnosis not present

## 2023-06-29 DIAGNOSIS — C029 Malignant neoplasm of tongue, unspecified: Secondary | ICD-10-CM

## 2023-06-29 LAB — CBC WITH DIFFERENTIAL/PLATELET
Abs Immature Granulocytes: 0.04 10*3/uL (ref 0.00–0.07)
Basophils Absolute: 0 10*3/uL (ref 0.0–0.1)
Basophils Relative: 0 %
Eosinophils Absolute: 0.1 10*3/uL (ref 0.0–0.5)
Eosinophils Relative: 1 %
HCT: 36.4 % — ABNORMAL LOW (ref 39.0–52.0)
Hemoglobin: 12.5 g/dL — ABNORMAL LOW (ref 13.0–17.0)
Immature Granulocytes: 0 %
Lymphocytes Relative: 2 %
Lymphs Abs: 0.2 10*3/uL — ABNORMAL LOW (ref 0.7–4.0)
MCH: 30.5 pg (ref 26.0–34.0)
MCHC: 34.3 g/dL (ref 30.0–36.0)
MCV: 88.8 fL (ref 80.0–100.0)
Monocytes Absolute: 0.7 10*3/uL (ref 0.1–1.0)
Monocytes Relative: 7 %
Neutro Abs: 8.8 10*3/uL — ABNORMAL HIGH (ref 1.7–7.7)
Neutrophils Relative %: 90 %
Platelets: 241 10*3/uL (ref 150–400)
RBC: 4.1 MIL/uL — ABNORMAL LOW (ref 4.22–5.81)
RDW: 16.1 % — ABNORMAL HIGH (ref 11.5–15.5)
WBC: 9.8 10*3/uL (ref 4.0–10.5)
nRBC: 0 % (ref 0.0–0.2)

## 2023-06-29 LAB — BASIC METABOLIC PANEL - CANCER CENTER ONLY
Anion gap: 9 (ref 5–15)
BUN: 25 mg/dL — ABNORMAL HIGH (ref 8–23)
CO2: 32 mmol/L (ref 22–32)
Calcium: 9.3 mg/dL (ref 8.9–10.3)
Chloride: 86 mmol/L — ABNORMAL LOW (ref 98–111)
Creatinine: 0.66 mg/dL (ref 0.61–1.24)
GFR, Estimated: >60 mL/min (ref >60)
Glucose, Bld: 333 mg/dL — ABNORMAL HIGH (ref 70–99)
Potassium: 4.4 mmol/L (ref 3.5–5.1)
Sodium: 127 mmol/L — ABNORMAL LOW (ref 135–145)

## 2023-06-29 LAB — MAGNESIUM: Magnesium: 1.7 mg/dL (ref 1.7–2.4)

## 2023-06-29 MED ORDER — PALONOSETRON HCL INJECTION 0.25 MG/5ML
0.2500 mg | Freq: Once | INTRAVENOUS | Status: AC
  Administered 2023-06-29: 0.25 mg via INTRAVENOUS
  Filled 2023-06-29: qty 5

## 2023-06-29 MED ORDER — SODIUM CHLORIDE 0.9 % IV SOLN
40.0000 mg/m2 | Freq: Once | INTRAVENOUS | Status: AC
  Administered 2023-06-29: 77 mg via INTRAVENOUS
  Filled 2023-06-29: qty 77

## 2023-06-29 MED ORDER — MAGNESIUM SULFATE 2 GM/50ML IV SOLN
2.0000 g | Freq: Once | INTRAVENOUS | Status: AC
Start: 2023-06-29 — End: 2023-06-29
  Administered 2023-06-29: 2 g via INTRAVENOUS
  Filled 2023-06-29: qty 50

## 2023-06-29 MED ORDER — SODIUM CHLORIDE 0.9 % IV SOLN
Freq: Once | INTRAVENOUS | Status: AC
Start: 2023-06-29 — End: 2023-06-29
  Filled 2023-06-29: qty 250

## 2023-06-29 MED ORDER — POTASSIUM CHLORIDE IN NACL 20-0.9 MEQ/L-% IV SOLN
Freq: Once | INTRAVENOUS | Status: AC
  Filled 2023-06-29: qty 1000

## 2023-06-29 MED ORDER — DEXAMETHASONE SODIUM PHOSPHATE 10 MG/ML IJ SOLN
4.0000 mg | Freq: Once | INTRAMUSCULAR | Status: AC
Start: 2023-06-29 — End: 2023-06-29
  Administered 2023-06-29: 4 mg via INTRAVENOUS
  Filled 2023-06-29: qty 1

## 2023-06-29 MED ORDER — TRIAMCINOLONE ACETONIDE 0.025 % EX OINT: 1.0000 | TOPICAL_OINTMENT | Freq: Two times a day (BID) | CUTANEOUS | 0 refills | Status: DC

## 2023-06-29 MED ORDER — FOSAPREPITANT DIMEGLUMINE INJECTION 150 MG
150.0000 mg | Freq: Once | INTRAVENOUS | Status: AC
  Administered 2023-06-29: 150 mg via INTRAVENOUS
  Filled 2023-06-29 (×2): qty 5
  Filled 2023-06-29: qty 150

## 2023-06-29 NOTE — Progress Notes (Signed)
Dr. Smith Robert at chairside to assess [place] site. Per Dr. Smith Robert give treatment via PIV and MD will call in steroid cream. Pt aware and agrees with plan.

## 2023-06-29 NOTE — Patient Instructions (Signed)
 [facility] - A DEPT OF [facility]  Discharge Instructions: Thank you for choosing [facility] to provide your oncology and hematology care.  If you have a lab appointment with the Cancer Center, please go directly to the Cancer Center and check in at the registration area.  Wear comfortable clothing and clothing appropriate for easy access to any Portacath or PICC line.   We strive to give you quality time with your provider. You may need to reschedule your appointment if you arrive late (15 or more minutes).  Arriving late affects you and other patients whose appointments are after yours.  Also, if you miss three or more appointments without notifying the office, you may be dismissed from the clinic at the provider's discretion.      For prescription refill requests, have your pharmacy contact our office and allow 72 hours for refills to be completed.    Today you received the following chemotherapy and/or immunotherapy agents Cisplatin.      To help prevent nausea and vomiting after your treatment, we encourage you to take your nausea medication as directed.  BELOW ARE SYMPTOMS THAT SHOULD BE REPORTED IMMEDIATELY: *FEVER GREATER THAN 100.4 F (38 C) OR HIGHER *CHILLS OR SWEATING *NAUSEA AND VOMITING THAT IS NOT CONTROLLED WITH YOUR NAUSEA MEDICATION *UNUSUAL SHORTNESS OF BREATH *UNUSUAL BRUISING OR BLEEDING *URINARY PROBLEMS (pain or burning when urinating, or frequent urination) *BOWEL PROBLEMS (unusual diarrhea, constipation, pain near the anus) TENDERNESS IN MOUTH AND THROAT WITH OR WITHOUT PRESENCE OF ULCERS (sore throat, sores in mouth, or a toothache) UNUSUAL RASH, SWELLING OR PAIN  UNUSUAL VAGINAL DISCHARGE OR ITCHING   Items with * indicate a potential emergency and should be followed up as soon as possible or go to the Emergency Department if any problems should occur.  Please show the CHEMOTHERAPY ALERT CARD or IMMUNOTHERAPY  ALERT CARD at check-in to the Emergency Department and triage nurse.  Should you have questions after your visit or need to cancel or reschedule your appointment, please contact [facility] - A DEPT OF [facility]  [phone] and follow the prompts.  Office hours are 8:00 a.m. to 4:30 p.m. Monday - Friday. Please note that voicemails left after 4:00 p.m. may not be returned until the following business day.  We are closed weekends and major holidays. You have access to a nurse at all times for urgent questions. Please call the main number to the clinic [phone] and follow the prompts.  For any non-urgent questions, you may also contact your provider using MyChart. We now offer e-Visits for anyone 25 and older to request care online for non-urgent symptoms. For details visit [email or web address].   Also download the MyChart app! Go to the app store, search "MyChart", open the app, select [place], and log in with your MyChart username and password.

## 2023-07-04 ENCOUNTER — Other Ambulatory Visit: Payer: Self-pay

## 2023-07-05 ENCOUNTER — Ambulatory Visit: Payer: 59

## 2023-07-05 ENCOUNTER — Other Ambulatory Visit: Payer: 59

## 2023-07-06 ENCOUNTER — Encounter: Payer: Self-pay | Admitting: Oncology

## 2023-07-06 ENCOUNTER — Inpatient Hospital Stay: Payer: 59

## 2023-07-06 ENCOUNTER — Telehealth: Payer: Self-pay | Admitting: *Deleted

## 2023-07-06 ENCOUNTER — Other Ambulatory Visit: Payer: Self-pay

## 2023-07-06 NOTE — Progress Notes (Signed)
Nutrition Follow-up:  Patient with SCC of oral cavity/oral tongue, stage IV.  S/p glossectomy with flap reconstruction at [facility].  Open G-tube performed on 10/22 at [facility].  Patient completed radiation and chemotherapy.  Spoke with patient via phone.  Reports that he is only able to swallow liquids orally with the exception of applesauce and yogurt.  He has been giving 6-8 cartons of osmolite 1.5 or equate plus via feeding tube.  Denies tolerance issues.     Medications: reviewed  Labs: reviewed from 12/11  Anthropometrics:   Weight 153 lb on 12/11 153 lb on 12/4 157 lb on 11/27 154 lb on 10/30 159 lb on 10/9 153 lb on 9/26    Estimated Energy Needs  Kcals: 2200-2500 Protein: 100-125 g Fluid: > 2.2 L  NUTRITION DIAGNOSIS: Unintentional weight loss stable    INTERVENTION:  Continue at least 6 -8 cartons of osmolite 1.5 via feeding tube.       MONITORING, EVALUATION, GOAL: weight trends, intake, tube feeding   NEXT VISIT: Wed, Jan 15 in clinic  Shane Perry, RD, LDN Registered Dietitian [phone]

## 2023-07-06 NOTE — Telephone Encounter (Signed)
I was asked by Sharia Reeve to reach out to patient to provide an [facility] apt tomorrow for patient's port to be evaluated. Pt reported to [facility] that his "port feels like he is being stung by a bee then the pain eases off and then comes back. "Dr Smith Robert prescribed kenalog cream that he has been applying for the past week. Pt stated that "he does not see much difference after starting the cream"  Smc apt made for 930 am tomorrow 07/07/23 with Sharia Reeve

## 2023-07-07 ENCOUNTER — Other Ambulatory Visit: Payer: Self-pay

## 2023-07-07 ENCOUNTER — Inpatient Hospital Stay (HOSPITAL_BASED_OUTPATIENT_CLINIC_OR_DEPARTMENT_OTHER): Payer: 59 | Admitting: Hospice and Palliative Medicine

## 2023-07-07 ENCOUNTER — Encounter: Payer: Self-pay | Admitting: Hospice and Palliative Medicine

## 2023-07-07 VITALS — BP 111/68 | HR 91 | Temp 96.5°F | Resp 18 | Ht 73.0 in | Wt 153.0 lb

## 2023-07-07 DIAGNOSIS — C029 Malignant neoplasm of tongue, unspecified: Secondary | ICD-10-CM | POA: Diagnosis not present

## 2023-07-07 DIAGNOSIS — R21 Rash and other nonspecific skin eruption: Secondary | ICD-10-CM

## 2023-07-07 DIAGNOSIS — Z5111 Encounter for antineoplastic chemotherapy: Secondary | ICD-10-CM | POA: Diagnosis not present

## 2023-07-07 MED ORDER — CEPHALEXIN 500 MG PO CAPS: 500.0000 mg | ORAL_CAPSULE | Freq: Two times a day (BID) | ORAL | 0 refills | Status: DC

## 2023-07-07 MED ORDER — CEPHALEXIN 500 MG PO CAPS
500.0000 mg | ORAL_CAPSULE | Freq: Four times a day (QID) | ORAL | 0 refills | Status: DC
Start: 2023-07-07 — End: 2023-07-07

## 2023-07-07 MED ORDER — SILVER SULFADIAZINE 1 % EX CREA: 1.0000 | TOPICAL_CREAM | Freq: Every day | CUTANEOUS | 0 refills | Status: DC

## 2023-07-07 NOTE — Addendum Note (Signed)
Addended by: Laurette Schimke R on: 07/07/2023 10:32 AM   Modules accepted: Orders

## 2023-07-07 NOTE — Progress Notes (Addendum)
Symptom Management Clinic [facility] at [facility] Telephone:([phone] Fax:(336) [phone]  Patient Care Team: Stevphen Rochester, MD as PCP - General (Family Medicine) Mariah Milling Tollie Pizza, MD as PCP - Cardiology (Cardiology) Carmina Miller, MD as Consulting Physician (Radiation Oncology) Creig Hines, MD as Consulting Physician (Oncology)   NAME OF PATIENT: Shane Perry  [id number]  1956/04/23   DATE OF VISIT: 07/07/23  REASON FOR CONSULT: Shane Perry is a 67 y.o. 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  Currently undergoing chemotherapy and radiation.   INTERVAL HISTORY: On 06/29/2023 patient received cycle 6 cisplatin.  At that time, he reported some pain and tenderness to port site and was started on triamcinolone cream.  Patient returns [facility] today for follow-up.  Patient says that he has had red, irritated skin over the past several weeks, which has become increasingly painful over the past week.  He denies fever or chills.  He has not noticed any swelling, pus, or drainage to the port.  He says that the whole area burns and feels like a "bee sting."  He has been applying the triamcinolone cream as prescribed but does not feel that it has helped yet.  Denies any neurologic complaints. Denies recent fevers or illnesses. Denies any easy bleeding or bruising. Reports good appetite and denies weight loss. Denies chest pain. Denies any nausea, vomiting, constipation, or diarrhea. Denies urinary complaints. Patient offers no further specific complaints today.  PAST MEDICAL HISTORY: Past Medical History:  Diagnosis Date   Cancer (HCC)    Diabetes mellitus without complication (HCC)    Diabetic polyneuropathy associated with type 2 diabetes mellitus (HCC)    History of kidney stones    Hyperlipidemia     Hypertension    Kidney stones    Onychomycosis    Panlobular emphysema (HCC)    Squamous cell carcinoma, tongue border (HCC)    Tongue cancer (HCC)    Weight loss     PAST SURGICAL HISTORY:  Past Surgical History:  Procedure Laterality Date   COLONOSCOPY     COLONOSCOPY WITH PROPOFOL N/A 04/26/2023   Procedure: COLONOSCOPY WITH PROPOFOL;  Surgeon: [name], MD;  Location: [facility] ENDOSCOPY;  Service: Endoscopy;  Laterality: N/A;   CYST REMOVAL NECK     GASTROSTOMY N/A 05/10/2023   Procedure: INSERTION OF GASTROSTOMY TUBE, open;  Surgeon: Henrene Dodge, MD;  Location: [facility] ORS;  Service: General;  Laterality: N/A;   IR IMAGING GUIDED PORT INSERTION  04/14/2023   left arm muscle removal for tongue     partial tongue removal  03/14/2023   PORTA CATH INSERTION Right    tongue cancer     TRACHEOSTOMY  03/14/2023    HEMATOLOGY/ONCOLOGY HISTORY:  Oncology History  Tongue cancer (HCC)  02/01/2023 Initial Diagnosis   Tongue cancer (HCC)   02/01/2023 Cancer Staging   Staging form: Oral Cavity, AJCC 8th Edition - Clinical stage from 02/01/2023: Stage II (cT2, cN0, cM0) - Signed by Creig Hines, MD on 02/01/2023 Histologic grade (G): G2 Histologic grading system: 3 grade system   03/29/2023 Cancer Staging   Staging form: Oral Cavity, AJCC 8th Edition - Pathologic stage from 03/29/2023: Stage IVB (pT4a, pN3b, cM0) - Signed by Creig Hines, MD on 03/29/2023   04/27/2023 -  Chemotherapy   Patient is on Treatment Plan : HEAD/NECK Cisplatin (40) q7d  ALLERGIES:  is allergic to statins, varenicline, and lisinopril.  MEDICATIONS:  Current Outpatient Medications  Medication Sig Dispense Refill   acetaminophen (TYLENOL) 160 MG/5ML solution Take 10 mLs (320 mg total) by mouth every 6 (six) hours as needed for moderate pain (pain score 4-6). 473 mL 1   aspirin 81 MG chewable tablet Chew 81 mg by mouth daily.     Blood Glucose Monitoring Suppl (ONETOUCH VERIO FLEX SYSTEM) w/Device KIT  by Does not apply route.     buPROPion (WELLBUTRIN SR) 150 MG 12 hr tablet Take 150 mg by mouth daily.     dexamethasone (DECADRON) 4 MG tablet Take 2 tablets (8 mg) by mouth daily x 3 days starting the day after cisplatin chemotherapy. Take with food. 30 tablet 1   glucose blood test strip      insulin lispro (HUMALOG) 100 UNIT/ML KwikPen INJECT 0-10 UNITS SUBCUTANEOUSLY WITH MEALS FOLLOWING SLIDING SCALE     insulin regular (NOVOLIN R) 100 units/mL injection Inject into the skin 3 (three) times daily before meals.     lidocaine-prilocaine (EMLA) cream Apply to affected area once 30 g 3   magic mouthwash (multi-ingredient) oral suspension Swish and swallow 1 to 2 teaspoonsfuls 4 times a day 480 mL 3   Nutritional Supplements (FEEDING SUPPLEMENT, OSMOLITE 1.5 CAL,) LIQD Give 1.5 cartons 4 times a day via feeding tube.  Flush with 60ml of water before and after each feeding. Provide additional 3 cups ( ) via feeding tube or orally to meet hydration needs.   Needs bolus supplies for tube feeding.     ondansetron (ZOFRAN) 8 MG tablet Take 1 tablet (8 mg total) by mouth every 8 (eight) hours as needed for nausea or vomiting. Start on the third day after cisplatin. 30 tablet 1   oxyCODONE (ROXICODONE) 5 MG/5ML solution Take 5 mLs (5 mg total) by mouth every 6 (six) hours as needed for severe pain (pain score 7-10). 140 mL 0   prochlorperazine (COMPAZINE) 10 MG tablet Take 1 tablet (10 mg total) by mouth every 6 (six) hours as needed (Nausea or vomiting). 30 tablet 1   sucralfate (CARAFATE) 1 g tablet Take 1 tablet (1 g total) by mouth 4 (four) times daily -  with meals and at bedtime. 120 tablet 3   triamcinolone (KENALOG) 0.025 % ointment Apply 1 Application topically 2 (two) times daily. 30 g 0   No current facility-administered medications for this visit.    VITAL SIGNS: There were no vitals taken for this visit. There were no vitals filed for this visit.  Estimated body mass index is 20.21  kg/m as calculated from the following:   Height as of 06/15/23: 6\' 1"  (1.854 m).   Weight as of 06/29/23: 153 lb 3.5 oz (69.5 kg).  LABS: CBC:    Component Value Date/Time   WBC 9.8 06/29/2023 0832   HGB 12.5 (L) 06/29/2023 0832   HGB 13.0 06/22/2023 0850   HCT 36.4 (L) 06/29/2023 0832   PLT 241 06/29/2023 0832   PLT 286 06/22/2023 0850   MCV 88.8 06/29/2023 0832   NEUTROABS 8.8 (H) 06/29/2023 0832   LYMPHSABS 0.2 (L) 06/29/2023 0832   MONOABS 0.7 06/29/2023 0832   EOSABS 0.1 06/29/2023 0832   BASOSABS 0.0 06/29/2023 0832   Comprehensive Metabolic Panel:    Component Value Date/Time   NA 127 (L) 06/29/2023 0832   K 4.4 06/29/2023 0832   CL 86 (L) 06/29/2023 0832   CO2 32 06/29/2023 0981  BUN 25 (H) 06/29/2023 0832   CREATININE 0.66 06/29/2023 0832   GLUCOSE 333 (H) 06/29/2023 0832   CALCIUM 9.3 06/29/2023 0832   AST 42 (H) 12/23/2022 1057   ALT 18 12/23/2022 1057   ALKPHOS 39 12/23/2022 1057   BILITOT 0.5 12/23/2022 1057   PROT 6.6 12/23/2022 1057   ALBUMIN 4.0 12/23/2022 1057    RADIOGRAPHIC STUDIES: No results found.  PERFORMANCE STATUS (ECOG) : 1 - Symptomatic but completely ambulatory  Review of Systems Unless otherwise noted, a complete review of systems is negative.  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  No clear evidence of infection, although will empirically cover with Keflex after discussion with Dr. Smith Robert.  Also discussed with IR (Dr. Loreta Ave) who was able to review the picture and would not advise for immediate port  removal.  "Removal can be considered after tissue heals.  Will prescribe Silvadene cream.  Plan to see patient back next week for reevaluation.  ED triggers reviewed in detail.   Patient expressed understanding and was in agreement with this plan. He also understands that He can call clinic at any time with any questions, concerns, or complaints.   Thank you for allowing me to participate in the care of this very pleasant patient.   Time Total: 20 minutes  Visit consisted of counseling and education dealing with the complex and emotionally intense issues of symptom management in the setting of serious illness.Greater than 50%  of this time was spent counseling and coordinating care related to the above assessment and plan.  Signed by: Laurette Schimke, PhD, NP-C

## 2023-07-14 ENCOUNTER — Inpatient Hospital Stay: Payer: 59 | Admitting: Hospice and Palliative Medicine

## 2023-07-14 ENCOUNTER — Encounter: Payer: Self-pay | Admitting: Hospice and Palliative Medicine

## 2023-07-14 ENCOUNTER — Telehealth: Payer: Self-pay | Admitting: *Deleted

## 2023-07-14 VITALS — BP 116/69 | HR 100 | Temp 96.9°F | Resp 18 | Wt 149.3 lb

## 2023-07-14 DIAGNOSIS — R21 Rash and other nonspecific skin eruption: Secondary | ICD-10-CM | POA: Diagnosis not present

## 2023-07-14 DIAGNOSIS — C029 Malignant neoplasm of tongue, unspecified: Secondary | ICD-10-CM

## 2023-07-14 DIAGNOSIS — Z5111 Encounter for antineoplastic chemotherapy: Secondary | ICD-10-CM | POA: Diagnosis not present

## 2023-07-14 NOTE — Progress Notes (Signed)
Symptom Management Clinic [facility] at [facility] Telephone:(336) [phone] Fax:(336) [phone]  Patient Care Team: Stevphen Rochester, MD as PCP - General (Family Medicine) Mariah Milling Tollie Pizza, MD as PCP - Cardiology (Cardiology) Carmina Miller, MD as Consulting Physician (Radiation Oncology) Creig Hines, MD as Consulting Physician (Oncology)   NAME OF PATIENT: Shane Perry  [id number]  [birth year]   DATE OF VISIT: 07/14/23  REASON FOR CONSULT: Shane Perry is a 67 y.o. 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  Currently undergoing chemotherapy and radiation.   INTERVAL HISTORY: On 06/29/2023 patient received cycle 6 cisplatin.  At that time, he reported some pain and tenderness to port site and was started on triamcinolone cream.  Patient seen on 07/07/2023 for follow-up regarding his rash.  Case discussed with Dr. Smith Robert and IR.  IR did not recommend port removal in light of severe skin irritation.  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  Patient was started on Silvadene cream.  He was also started empirically on cephalexin.  Patient returns [facility] today for follow-up.  Today, patient reports that he is feeling improved.  Feels like he is having less pain at the rash but is concerned that it "looks worse."  Denies any fever or chills.  Denies any other new or symptomatic complaints or concerns.  Denies any neurologic complaints. Denies recent fevers or illnesses. Denies any easy bleeding or bruising. Reports good appetite and denies weight loss. Denies chest pain. Denies any nausea, vomiting, constipation, or diarrhea. Denies urinary complaints. Patient offers no further specific complaints today.  PAST MEDICAL HISTORY: Past Medical History:  Diagnosis Date   Cancer (HCC)    Diabetes mellitus without  complication (HCC)    Diabetic polyneuropathy associated with type 2 diabetes mellitus (HCC)    History of kidney stones    Hyperlipidemia    Hypertension    Kidney stones    Onychomycosis    Panlobular emphysema (HCC)    Squamous cell carcinoma, tongue border (HCC)    Tongue cancer (HCC)    Weight loss     PAST SURGICAL HISTORY:  Past Surgical History:  Procedure Laterality Date   COLONOSCOPY     COLONOSCOPY WITH PROPOFOL N/A 04/26/2023   Procedure: COLONOSCOPY WITH PROPOFOL;  Surgeon: [name], MD;  Location: [facility] ENDOSCOPY;  Service: Endoscopy;  Laterality: N/A;   CYST REMOVAL NECK     GASTROSTOMY N/A 05/10/2023   Procedure: INSERTION OF GASTROSTOMY TUBE, open;  Surgeon: Henrene Dodge, MD;  Location: [facility] ORS;  Service: General;  Laterality: N/A;   IR IMAGING GUIDED PORT INSERTION  04/14/2023   left arm muscle removal for tongue     partial tongue removal  03/14/2023   PORTA CATH INSERTION Right    tongue cancer     TRACHEOSTOMY  03/14/2023    HEMATOLOGY/ONCOLOGY HISTORY:  Oncology History  Tongue cancer (HCC)  02/01/2023 Initial Diagnosis   Tongue cancer (HCC)   02/01/2023 Cancer Staging   Staging form: Oral Cavity, AJCC 8th Edition - Clinical stage from 02/01/2023: Stage II (cT2, cN0, cM0) - Signed by Creig Hines, MD on 02/01/2023 Histologic grade (G): G2 Histologic grading system: 3 grade system   03/29/2023 Cancer Staging   Staging form: Oral Cavity, AJCC 8th Edition - Pathologic stage from 03/29/2023: Stage IVB (pT4a, pN3b, cM0) - Signed by Creig Hines, MD on 03/29/2023  04/27/2023 -  Chemotherapy   Patient is on Treatment Plan : HEAD/NECK Cisplatin (40) q7d       ALLERGIES:  is allergic to statins, varenicline, and lisinopril.  MEDICATIONS:  Current Outpatient Medications  Medication Sig Dispense Refill   acetaminophen (TYLENOL) 160 MG/5ML solution Take 10 mLs (320 mg total) by mouth every 6 (six) hours as needed for moderate pain (pain score 4-6).  473 mL 1   aspirin 81 MG chewable tablet Chew 81 mg by mouth daily.     Blood Glucose Monitoring Suppl (ONETOUCH VERIO FLEX SYSTEM) w/Device KIT by Does not apply route.     buPROPion (WELLBUTRIN SR) 150 MG 12 hr tablet Take 150 mg by mouth daily.     cephALEXin (KEFLEX) 500 MG capsule Take 1 capsule (500 mg total) by mouth 2 (two) times daily. 20 capsule 0   glucose blood test strip      insulin lispro (HUMALOG) 100 UNIT/ML KwikPen INJECT 0-10 UNITS SUBCUTANEOUSLY WITH MEALS FOLLOWING SLIDING SCALE     insulin regular (NOVOLIN R) 100 units/mL injection Inject into the skin 3 (three) times daily before meals.     lidocaine-prilocaine (EMLA) cream Apply to affected area once 30 g 3   magic mouthwash (multi-ingredient) oral suspension Swish and swallow 1 to 2 teaspoonsfuls 4 times a day 480 mL 3   Nutritional Supplements (FEEDING SUPPLEMENT, OSMOLITE 1.5 CAL,) LIQD Give 1.5 cartons 4 times a day via feeding tube.  Flush with 60ml of water before and after each feeding. Provide additional 3 cups ( ) via feeding tube or orally to meet hydration needs.   Needs bolus supplies for tube feeding.     ondansetron (ZOFRAN) 8 MG tablet Take 1 tablet (8 mg total) by mouth every 8 (eight) hours as needed for nausea or vomiting. Start on the third day after cisplatin. 30 tablet 1   oxyCODONE (ROXICODONE) 5 MG/5ML solution Take 5 mLs (5 mg total) by mouth every 6 (six) hours as needed for severe pain (pain score 7-10). 140 mL 0   prochlorperazine (COMPAZINE) 10 MG tablet Take 1 tablet (10 mg total) by mouth every 6 (six) hours as needed (Nausea or vomiting). 30 tablet 1   silver sulfADIAZINE (SILVADENE) 1 % cream Apply 1 Application topically daily. 50 g 0   sucralfate (CARAFATE) 1 g tablet Take 1 tablet (1 g total) by mouth 4 (four) times daily -  with meals and at bedtime. 120 tablet 3   triamcinolone (KENALOG) 0.025 % ointment Apply 1 Application topically 2 (two) times daily. 30 g 0   No current  facility-administered medications for this visit.    VITAL SIGNS: There were no vitals taken for this visit. There were no vitals filed for this visit.  Estimated body mass index is 20.19 kg/m as calculated from the following:   Height as of 07/07/23: 6\' 1"  (1.854 m).   Weight as of 07/07/23: 153 lb (69.4 kg).  LABS: CBC:    Component Value Date/Time   WBC 9.8 06/29/2023 0832   HGB 12.5 (L) 06/29/2023 0832   HGB 13.0 06/22/2023 0850   HCT 36.4 (L) 06/29/2023 0832   PLT 241 06/29/2023 0832   PLT 286 06/22/2023 0850   MCV 88.8 06/29/2023 0832   NEUTROABS 8.8 (H) 06/29/2023 0832   LYMPHSABS 0.2 (L) 06/29/2023 0832   MONOABS 0.7 06/29/2023 0832   EOSABS 0.1 06/29/2023 0832   BASOSABS 0.0 06/29/2023 0832   Comprehensive Metabolic Panel:    Component Value Date/Time  NA 127 (L) 06/29/2023 0832   K 4.4 06/29/2023 0832   CL 86 (L) 06/29/2023 0832   CO2 32 06/29/2023 0832   BUN 25 (H) 06/29/2023 0832   CREATININE 0.66 06/29/2023 0832   GLUCOSE 333 (H) 06/29/2023 0832   CALCIUM 9.3 06/29/2023 0832   AST 42 (H) 12/23/2022 1057   ALT 18 12/23/2022 1057   ALKPHOS 39 12/23/2022 1057   BILITOT 0.5 12/23/2022 1057   PROT 6.6 12/23/2022 1057   ALBUMIN 4.0 12/23/2022 1057    RADIOGRAPHIC STUDIES: No results found.  PERFORMANCE STATUS (ECOG) : 1 - Symptomatic but completely ambulatory  Review of Systems Unless otherwise noted, a complete review of systems is negative.  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  However,  spoke with Dr. Rushie Chestnut who did not feel that the radiation was in the margins of the treatment field.  Also discussed with Dr. Smith Robert and biopsy was recommended.  Spoke with Dr. Claudine Mouton with general surgery who will see patient for biopsy.  Patient does not have any systemic signs or symptoms of infection.  He completed course of cephalexin.     Patient expressed understanding and was in agreement with this plan. He also understands that He can call clinic at any time with any questions, concerns, or complaints.   Thank you for allowing me to participate in the care of this very pleasant patient.   Time Total: 20 minutes  Visit consisted of counseling and education dealing with the complex and emotionally intense issues of symptom management in the setting of serious illness.Greater than 50%  of this time was spent counseling and coordinating care related to the above assessment and plan.  Signed by: Laurette Schimke, PhD, NP-C

## 2023-07-14 NOTE — Progress Notes (Signed)
Rash looks relatively the same, some better. Crusting in areas. Occ stinging sensations. No fevers.Tolerating TF's well. Denies any nausea or diarrhea. Trach intact. Changed G tube dressing today, skin completely intact. Changed dressing on left forearm. Pt does have some weakness. No falls. Pt takes all of his medications by mouth.

## 2023-07-14 NOTE — Telephone Encounter (Signed)
I personally reached out to the patient. He no showed for the apt this am. Pt can be here by 11 am.  Pt stated that his rash is getting worse.

## 2023-07-18 ENCOUNTER — Telehealth: Payer: Self-pay | Admitting: *Deleted

## 2023-07-18 ENCOUNTER — Other Ambulatory Visit: Payer: Self-pay | Admitting: *Deleted

## 2023-07-18 DIAGNOSIS — R21 Rash and other nonspecific skin eruption: Secondary | ICD-10-CM

## 2023-07-18 DIAGNOSIS — C029 Malignant neoplasm of tongue, unspecified: Secondary | ICD-10-CM

## 2023-07-18 NOTE — Telephone Encounter (Signed)
Patient showed up to cancer center today requesting a "culture" of his chest wall. He stated that he was to be contacted with an apt and had not heard when his apt for the "culture" would be. I personally reviewed his chart. Per Sharia Reeve, Np's documentation, Josh spoke with Dr. Claudine Mouton last week, who recommended a chest wall biopsy of the site. I contacted Dr. Benson Norway office. Apt obtained for this Thursday, 1/2 at 10 am. Discussed plan of care with patient. Pt accepted apt w/Dr. Claudine Mouton. AVS printed.

## 2023-07-19 ENCOUNTER — Emergency Department (HOSPITAL_COMMUNITY): Payer: 59

## 2023-07-19 ENCOUNTER — Other Ambulatory Visit: Payer: Self-pay

## 2023-07-19 ENCOUNTER — Telehealth: Payer: 59 | Admitting: Hospice and Palliative Medicine

## 2023-07-19 ENCOUNTER — Emergency Department (HOSPITAL_COMMUNITY)
Admission: EM | Admit: 2023-07-19 | Discharge: 2023-07-19 | Disposition: A | Discharge status: Home / Self Care | Payer: 59

## 2023-07-19 DIAGNOSIS — I10 Essential (primary) hypertension: Secondary | ICD-10-CM | POA: Insufficient documentation

## 2023-07-19 DIAGNOSIS — R410 Disorientation, unspecified: Secondary | ICD-10-CM | POA: Diagnosis present

## 2023-07-19 DIAGNOSIS — L259 Unspecified contact dermatitis, unspecified cause: Secondary | ICD-10-CM | POA: Diagnosis not present

## 2023-07-19 DIAGNOSIS — R4182 Altered mental status, unspecified: Secondary | ICD-10-CM | POA: Insufficient documentation

## 2023-07-19 DIAGNOSIS — Z794 Long term (current) use of insulin: Secondary | ICD-10-CM | POA: Diagnosis not present

## 2023-07-19 DIAGNOSIS — R29810 Facial weakness: Secondary | ICD-10-CM | POA: Diagnosis not present

## 2023-07-19 DIAGNOSIS — Z79899 Other long term (current) drug therapy: Secondary | ICD-10-CM | POA: Insufficient documentation

## 2023-07-19 LAB — CBC WITH DIFFERENTIAL/PLATELET
Abs Immature Granulocytes: 0.02 10*3/uL (ref 0.00–0.07)
Basophils Absolute: 0 10*3/uL (ref 0.0–0.1)
Basophils Relative: 0 %
Eosinophils Absolute: 0.1 10*3/uL (ref 0.0–0.5)
Eosinophils Relative: 1 %
HCT: 38.8 % — ABNORMAL LOW (ref 39.0–52.0)
Hemoglobin: 13.1 g/dL (ref 13.0–17.0)
Immature Granulocytes: 0 %
Lymphocytes Relative: 3 %
Lymphs Abs: 0.2 10*3/uL — ABNORMAL LOW (ref 0.7–4.0)
MCH: 30.8 pg (ref 26.0–34.0)
MCHC: 33.8 g/dL (ref 30.0–36.0)
MCV: 91.3 fL (ref 80.0–100.0)
Monocytes Absolute: 0.6 10*3/uL (ref 0.1–1.0)
Monocytes Relative: 8 %
Neutro Abs: 6.4 10*3/uL (ref 1.7–7.7)
Neutrophils Relative %: 88 %
Platelets: 193 10*3/uL (ref 150–400)
RBC: 4.25 MIL/uL (ref 4.22–5.81)
RDW: 16.9 % — ABNORMAL HIGH (ref 11.5–15.5)
WBC: 7.3 10*3/uL (ref 4.0–10.5)
nRBC: 0 % (ref 0.0–0.2)

## 2023-07-19 LAB — RAPID URINE DRUG SCREEN, HOSP PERFORMED
Amphetamines: NOT DETECTED
Barbiturates: NOT DETECTED
Benzodiazepines: NOT DETECTED
Cocaine: NOT DETECTED
Opiates: NOT DETECTED
Tetrahydrocannabinol: NOT DETECTED

## 2023-07-19 LAB — COMPREHENSIVE METABOLIC PANEL
ALT: 15 U/L (ref 0–44)
AST: 12 U/L — ABNORMAL LOW (ref 15–41)
Albumin: 3.4 g/dL — ABNORMAL LOW (ref 3.5–5.0)
Alkaline Phosphatase: 91 U/L (ref 38–126)
Anion gap: 9 (ref 5–15)
BUN: 22 mg/dL (ref 8–23)
CO2: 32 mmol/L (ref 22–32)
Calcium: 11 mg/dL — ABNORMAL HIGH (ref 8.9–10.3)
Chloride: 90 mmol/L — ABNORMAL LOW (ref 98–111)
Creatinine, Ser: 0.63 mg/dL (ref 0.61–1.24)
GFR, Estimated: >60 mL/min (ref >60)
Glucose, Bld: 216 mg/dL — ABNORMAL HIGH (ref 70–99)
Potassium: 4.2 mmol/L (ref 3.5–5.1)
Sodium: 131 mmol/L — ABNORMAL LOW (ref 135–145)
Total Bilirubin: 0.7 mg/dL (ref 0.0–1.2)
Total Protein: 6.9 g/dL (ref 6.5–8.1)

## 2023-07-19 LAB — CBG MONITORING, ED: Glucose-Capillary: 192 mg/dL — ABNORMAL HIGH (ref 70–99)

## 2023-07-19 LAB — URINALYSIS, ROUTINE W REFLEX MICROSCOPIC
Bilirubin Urine: NEGATIVE
Glucose, UA: NEGATIVE mg/dL
Ketones, ur: NEGATIVE mg/dL
Leukocytes,Ua: NEGATIVE
Nitrite: NEGATIVE
Protein, ur: 30 mg/dL — AB
Specific Gravity, Urine: 1.012 (ref 1.005–1.030)
pH: 6 (ref 5.0–8.0)

## 2023-07-19 LAB — ETHANOL: Alcohol, Ethyl (B): <10 mg/dL (ref <10)

## 2023-07-19 LAB — LIPASE, BLOOD: Lipase: 19 U/L (ref 11–51)

## 2023-07-19 LAB — TROPONIN I (HIGH SENSITIVITY)
Troponin I (High Sensitivity): 6 ng/L (ref <18)
Troponin I (High Sensitivity): 6 ng/L (ref <18)

## 2023-07-19 LAB — BRAIN NATRIURETIC PEPTIDE: B Natriuretic Peptide: 50 pg/mL (ref 0.0–100.0)

## 2023-07-19 LAB — LACTIC ACID, PLASMA: Lactic Acid, Venous: 1.3 mmol/L (ref 0.5–1.9)

## 2023-07-19 MED ORDER — SODIUM CHLORIDE 0.9 % IV BOLUS
1000.0000 mL | Freq: Once | INTRAVENOUS | Status: AC
  Administered 2023-07-19: 1000 mL via INTRAVENOUS

## 2023-07-19 NOTE — ED Notes (Signed)
 Pts brother stated family Hx of dementia and Alzheimer's disease and that he is starting to notice similar Sx that pts father exhibiting when he was Dx with dementia. Pt is exhibiting some restlessness with his hands and repetitive movements with hands playing with facial tiss paper/folding and unfolding and straightening tissue

## 2023-07-19 NOTE — ED Provider Notes (Signed)
 [place] EMERGENCY DEPARTMENT AT [facility] Provider Note   CSN: [id number] Arrival date & time: 07/19/23  9273     History  Chief Complaint  Patient presents with   Altered Mental Status    Gurdeep L Kaigler is a 67 y.o. male.  This is a 68 year old male presenting emergency department by EMS after being found driving on wrong side of road. Reportedly was confused with EMS stating that he had been driving since 8 AM, but they picked him up at 7 AM.  Was supposedly disoriented to year.  Stroke screen negative.  Patient with no specific complaints. A&Ox3.    Altered Mental Status      Home Medications Prior to Admission medications   Medication Sig Start Date End Date Taking? Authorizing Provider  acetaminophen  (TYLENOL ) 160 MG/5ML solution Take 10 mLs (320 mg total) by mouth every 6 (six) hours as needed for moderate pain (pain score 4-6). 06/13/23   Dasie Tinnie MATSU, NP  aspirin  81 MG chewable tablet Chew 81 mg by mouth daily.    [provider]  Blood Glucose Monitoring Suppl (ONETOUCH VERIO FLEX SYSTEM) w/Device KIT by Does not apply route. 10/14/22 10/14/23  [provider]  buPROPion  (WELLBUTRIN  SR) 150 MG 12 hr tablet Take 150 mg by mouth daily.    [provider]  cephALEXin  (KEFLEX ) 500 MG capsule Take 1 capsule (500 mg total) by mouth 2 (two) times daily. 07/07/23   Borders, Fonda SAUNDERS, NP  glucose blood test strip  02/10/23 02/10/24  [provider]  insulin  lispro (HUMALOG) 100 UNIT/ML KwikPen INJECT 0-10 UNITS SUBCUTANEOUSLY WITH MEALS FOLLOWING SLIDING SCALE 05/27/23   [provider]  insulin  regular (NOVOLIN R) 100 units/mL injection Inject into the skin 3 (three) times daily before meals.    [provider]  lidocaine -prilocaine  (EMLA ) cream Apply to affected area once 03/29/23   Melanee Annah BROCKS, MD  magic mouthwash (multi-ingredient) oral suspension Swish and swallow 1 to 2 teaspoonsfuls 4 times a day  06/22/23     Nutritional Supplements (FEEDING SUPPLEMENT, OSMOLITE 1.5 CAL,) LIQD Give 1.5 cartons 4 times a day via feeding tube.  Flush with 60ml of water  before and after each feeding. Provide additional 3 cups ( ) via feeding tube or orally to meet hydration needs.   Needs bolus supplies for tube feeding. 04/29/23   Melanee Annah BROCKS, MD  ondansetron  (ZOFRAN ) 8 MG tablet Take 1 tablet (8 mg total) by mouth every 8 (eight) hours as needed for nausea or vomiting. Start on the third day after cisplatin . 03/29/23   Melanee Annah BROCKS, MD  oxyCODONE  (ROXICODONE ) 5 MG/5ML solution Take 5 mLs (5 mg total) by mouth every 6 (six) hours as needed for severe pain (pain score 7-10). 06/13/23   Dasie Tinnie MATSU, NP  prochlorperazine  (COMPAZINE ) 10 MG tablet Take 1 tablet (10 mg total) by mouth every 6 (six) hours as needed (Nausea or vomiting). 03/29/23   Melanee Annah BROCKS, MD  silver  sulfADIAZINE  (SILVADENE ) 1 % cream Apply 1 Application topically daily. 07/07/23   Borders, Fonda SAUNDERS, NP  sucralfate  (CARAFATE ) 1 g tablet Take 1 tablet (1 g total) by mouth 4 (four) times daily -  with meals and at bedtime. 06/21/23   Lenn Aran, MD  triamcinolone  (KENALOG ) 0.025 % ointment Apply 1 Application topically 2 (two) times daily. 06/29/23   Melanee Annah BROCKS, MD      Allergies    Statins, Varenicline, and Lisinopril    Review of  Systems   Review of Systems  Physical Exam Updated Vital Signs BP 127/83 (BP Location: Left Arm)   Pulse 84   Temp (!) 97 F (36.1 C) (Axillary)   Resp 18   Ht 6' 1 (1.854 m)   Wt 67.7 kg   SpO2 94%   BMI 19.70 kg/m  Physical Exam Vitals and nursing note reviewed.  Constitutional:      General: He is not in acute distress.    Appearance: He is not toxic-appearing.     Comments: Chronically ill-appearing  HENT:     Head: Normocephalic.     Nose: Nose normal.     Mouth/Throat:     Mouth: Mucous membranes are moist.     Comments: Patient with left-sided facial droop, reportedly  at baseline from cancer resection.   Eyes:     Conjunctiva/sclera: Conjunctivae normal.     Pupils: Pupils are equal, round, and reactive to light.  Neck:     Comments: Trach in place.  Some dermatitis  to the anterior neck Cardiovascular:     Rate and Rhythm: Normal rate and regular rhythm.  Pulmonary:     Effort: Pulmonary effort is normal.     Breath sounds: Normal breath sounds.  Abdominal:     General: Abdomen is flat. There is no distension.     Palpations: Abdomen is soft.     Tenderness: There is no abdominal tenderness. There is no guarding or rebound.     Comments: G-tube in place  Musculoskeletal:        General: Normal range of motion.  Skin:    General: Skin is warm and dry.     Capillary Refill: Capillary refill takes less than 2 seconds.  Neurological:     Mental Status: He is alert and oriented to person, place, and time.     Cranial Nerves: No cranial nerve deficit.     Sensory: No sensory deficit.     Motor: No weakness.     Coordination: Coordination normal.     Comments: No cranial nerve deficits.  Does have some baseline facial droop from prior cancer resection.  Dysarthric, but at baseline per family at bedside.  Does seemingly have some word finding difficulty.  Psychiatric:        Mood and Affect: Mood normal.        Behavior: Behavior normal.     ED Results / Procedures / Treatments   Labs (all labs ordered are listed, but only abnormal results are displayed) Labs Reviewed  CBC WITH DIFFERENTIAL/PLATELET - Abnormal; Notable for the following components:      Result Value   HCT 38.8 (*)    RDW 16.9 (*)    Lymphs Abs 0.2 (*)    All other components within normal limits  COMPREHENSIVE METABOLIC PANEL - Abnormal; Notable for the following components:   Sodium 131 (*)    Chloride 90 (*)    Glucose, Bld 216 (*)    Calcium  11.0 (*)    Albumin 3.4 (*)    AST 12 (*)    All other components within normal limits  URINALYSIS, ROUTINE W REFLEX  MICROSCOPIC - Abnormal; Notable for the following components:   Hgb urine dipstick SMALL (*)    Protein, ur 30 (*)    Bacteria, UA RARE (*)    All other components within normal limits  CBG MONITORING, ED - Abnormal; Notable for the following components:   Glucose-Capillary 192 (*)    All other  components within normal limits  LIPASE, BLOOD  ETHANOL  RAPID URINE DRUG SCREEN, HOSP PERFORMED  LACTIC ACID, PLASMA  BRAIN NATRIURETIC PEPTIDE  TROPONIN I (HIGH SENSITIVITY)  TROPONIN I (HIGH SENSITIVITY)    EKG EKG Interpretation Date/Time:  Tuesday July 19 2023 07:42:47 EST Ventricular Rate:  85 PR Interval:  169 QRS Duration:  111 QT Interval:  382 QTC Calculation: 455 R Axis:   84  Text Interpretation: Sinus rhythm Borderline right axis deviation Minimal ST depression, inferior leads Borderline ST elevation, anterior leads Confirmed by Neysa Clap [phone]) on 07/19/2023 8:49:26 AM  Radiology MR BRAIN WO CONTRAST Result Date: 07/19/2023 CLINICAL DATA:  Provided history: Stroke, follow-up. EXAM: MRI HEAD WITHOUT CONTRAST TECHNIQUE: Multiplanar, multiecho pulse sequences of the brain and surrounding structures were obtained without intravenous contrast. COMPARISON:  Head CT 07/19/2023. FINDINGS: Brain: Mild generalized cerebral atrophy. Multifocal T2 FLAIR hyperintense signal abnormality within the cerebral white matter, nonspecific but compatible with mild chronic small vessel ischemic disease. Punctate chronic microhemorrhage within the inferior left parietal lobe. 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 Two small chronic infarcts within the left cerebellar hemisphere. There is no acute infarct. No evidence of an intracranial mass. No extra-axial fluid collection. No midline shift. Vascular: Maintained flow voids within the proximal large arterial vessels. Skull and upper cervical spine: No focal worrisome marrow lesion. Sinuses/Orbits: No mass or acute finding within  the imaged orbits. Small mucous retention cyst within the left maxillary sinus. IMPRESSION: 1.  No evidence of an acute intracranial abnormality. 2. 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 3. Chronic microhemorrhage within the left parietal lobe. 4. Small left maxillary sinus mucous retention cyst.  Yes Electronically Signed   By: Rockey Childs D.O.   On: 07/19/2023 11:26   CT Head Wo Contrast Result Date: 07/19/2023 CLINICAL DATA:  Altered mental status with unknown cause. EXAM: CT HEAD WITHOUT CONTRAST TECHNIQUE: Contiguous axial images were obtained from the base of the skull through the vertex without intravenous contrast. RADIATION DOSE REDUCTION: This exam was performed according to the departmental dose-optimization program which includes automated exposure control, adjustment of the mA and/or kV according to patient size and/or use of iterative reconstruction technique. COMPARISON:  None Available. FINDINGS: Brain: No evidence of acute infarction, hemorrhage, hydrocephalus, extra-axial collection or mass lesion/mass effect. Small, discrete and chronic appearing lacune is in the left cerebellum. Mild generalized cerebral volume loss. Vascular: No hyperdense vessel or unexpected calcification. Skull: Normal. Negative for fracture or focal lesion. Sinuses/Orbits: No acute finding IMPRESSION: No acute or reversible finding. Electronically Signed   By: Dorn Roulette M.D.   On: 07/19/2023 08:16    Procedures Procedures    Medications Ordered in ED Medications  sodium chloride  0.9 % bolus 1,000 mL (0 mLs Intravenous Stopped 07/19/23 1200)    ED Course/ Medical Decision Making/ A&P Clinical Course as of 07/19/23 1559  Tue Jul 19, 2023  0918 Patient's brother presented to bedside.  Stated that he last talked to him 2 nights ago on the phone and everything seemed fine.  He then went to his house yesterday evening and noted some cognitive  thought blocking and possible word finding difficulty.  This was yesterday evening roughly 6 PM.  No apparent new facial droop, worsening dysarthria, unilateral weakness.  He reports that his brother is still not at his baseline and is still demonstrating some cognitive difficulty. [TY]  1245 Patient appears to be mentating well.  Awaiting brother to  return to verify his status as his workup is largely reassuring with no overt cause of his symptoms today.   [TY]  1358 Brother notes that patient somewhat improved.  Negative workup today.  Doubt acute stroke given negative MRI and negative CT head.  No fever tachycardia leukocytosis to suggest systemic infection.  No significant metabolic derangements.  No evidence of cardiac etiology.  Alcohol undetectable.  Urine drug screen negative.  UA negative for UTI.  Patient has remained hemodynamically stable.  He is seemingly alert and oriented x 3.  He has been displaying some evidence of cognitive impairment/decline over the past several months similar to other family member with dementia.  Brother feels safe taking patient home and monitoring him.  Follow-up with PCP.  Return precautions given. [TY]    Clinical Course User Index [TY] Neysa Caron PARAS, DO                                 Medical Decision Making 67 year old male presenting emergency department for altered mental status.  He is afebrile nontachycardic mildly hypertensive on arrival.  Maintaining his oxygen saturation on room air.  No obvious source of infection.  Patient has no specific complaints or pain.  No gross motor deficits.  Brother at bedside notes patient droop and dysarthria is at baseline.  However states that patient's word finding difficulty is patients word finding difficulty is new. LKN 2 days ago with phone conversation, brother saw yesterday evening and noted word finding difficulty. Outside window for acute stroke intervention. VAN negative.  Displaying some repetitive movements  and picking at napkin with upper extremities.  Further notes strong family history of dementia/Alzheimer's and the patient may be has been displaying some signs over the past month or 2, but nothing as pronounced as today.  Broad altered mental status workup underway.  See ED course for further MDM disposition.  Amount and/or Complexity of Data Reviewed Labs: ordered. Radiology: ordered. ECG/medicine tests: ordered.          Final Clinical Impression(s) / ED Diagnoses Final diagnoses:  Confusion    Rx / DC Orders ED Discharge Orders     None         Neysa Caron PARAS, DO 07/19/23 1559

## 2023-07-19 NOTE — ED Triage Notes (Signed)
 Pt arrived via [facility] c/o driving into oncoming traffic on 29.pt is from home. Unknown LKW.  no Hx of dementia. Pt is altered to situation and time but not to person. 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 Also R side facial droop baseline after his throat surgery per his brother as well. Has a trach and feeding tube present prior to ED arrival

## 2023-07-19 NOTE — ED Notes (Signed)
Per MD, pt may have something to drink if pt passes swallow screen. Pt passed swallow screen

## 2023-07-19 NOTE — Discharge Instructions (Signed)
 Please follow-up with your primary doctor.  Return Millie develop fevers, chills, sudden onset headache, fall, seizure, chest pain, shortness of breath, inability eat or drink due to nausea vomiting or you develop any new or worsening symptoms that are concerning to you.  As discussed please follow-up with your primary doctors as soon as possible.

## 2023-07-19 NOTE — ED Notes (Signed)
 Patient transported to MRI

## 2023-07-21 ENCOUNTER — Emergency Department: Payer: Medicare Other

## 2023-07-21 ENCOUNTER — Encounter: Payer: Self-pay | Admitting: Oncology

## 2023-07-21 ENCOUNTER — Inpatient Hospital Stay
Admission: EM | Admit: 2023-07-21 | Discharge: 2023-08-20 | DRG: 564 | Disposition: E | Payer: Medicare Other | Attending: Hospitalist | Admitting: Hospitalist

## 2023-07-21 ENCOUNTER — Ambulatory Visit: Payer: 59 | Admitting: Surgery

## 2023-07-21 DIAGNOSIS — Z833 Family history of diabetes mellitus: Secondary | ICD-10-CM

## 2023-07-21 DIAGNOSIS — Z794 Long term (current) use of insulin: Secondary | ICD-10-CM

## 2023-07-21 DIAGNOSIS — Z8249 Family history of ischemic heart disease and other diseases of the circulatory system: Secondary | ICD-10-CM

## 2023-07-21 DIAGNOSIS — C029 Malignant neoplasm of tongue, unspecified: Secondary | ICD-10-CM | POA: Diagnosis present

## 2023-07-21 DIAGNOSIS — K219 Gastro-esophageal reflux disease without esophagitis: Secondary | ICD-10-CM | POA: Diagnosis not present

## 2023-07-21 DIAGNOSIS — Z85118 Personal history of other malignant neoplasm of bronchus and lung: Secondary | ICD-10-CM

## 2023-07-21 DIAGNOSIS — R0682 Tachypnea, not elsewhere classified: Secondary | ICD-10-CM | POA: Diagnosis present

## 2023-07-21 DIAGNOSIS — F32A Depression, unspecified: Secondary | ICD-10-CM | POA: Insufficient documentation

## 2023-07-21 DIAGNOSIS — R569 Unspecified convulsions: Secondary | ICD-10-CM

## 2023-07-21 DIAGNOSIS — Z515 Encounter for palliative care: Secondary | ICD-10-CM | POA: Diagnosis not present

## 2023-07-21 DIAGNOSIS — Z85810 Personal history of malignant neoplasm of tongue: Secondary | ICD-10-CM

## 2023-07-21 DIAGNOSIS — R64 Cachexia: Secondary | ICD-10-CM | POA: Diagnosis present

## 2023-07-21 DIAGNOSIS — J431 Panlobular emphysema: Secondary | ICD-10-CM | POA: Diagnosis present

## 2023-07-21 DIAGNOSIS — E1142 Type 2 diabetes mellitus with diabetic polyneuropathy: Secondary | ICD-10-CM

## 2023-07-21 DIAGNOSIS — Z87442 Personal history of urinary calculi: Secondary | ICD-10-CM

## 2023-07-21 DIAGNOSIS — Z681 Body mass index (BMI) 19 or less, adult: Secondary | ICD-10-CM | POA: Diagnosis not present

## 2023-07-21 DIAGNOSIS — Z7982 Long term (current) use of aspirin: Secondary | ICD-10-CM

## 2023-07-21 DIAGNOSIS — G9341 Metabolic encephalopathy: Secondary | ICD-10-CM | POA: Insufficient documentation

## 2023-07-21 DIAGNOSIS — C787 Secondary malignant neoplasm of liver and intrahepatic bile duct: Secondary | ICD-10-CM | POA: Diagnosis present

## 2023-07-21 DIAGNOSIS — J9602 Acute respiratory failure with hypercapnia: Secondary | ICD-10-CM | POA: Diagnosis not present

## 2023-07-21 DIAGNOSIS — E870 Hyperosmolality and hypernatremia: Secondary | ICD-10-CM | POA: Diagnosis present

## 2023-07-21 DIAGNOSIS — T796XXA Traumatic ischemia of muscle, initial encounter: Principal | ICD-10-CM

## 2023-07-21 DIAGNOSIS — I152 Hypertension secondary to endocrine disorders: Secondary | ICD-10-CM | POA: Diagnosis present

## 2023-07-21 DIAGNOSIS — Z931 Gastrostomy status: Secondary | ICD-10-CM | POA: Diagnosis not present

## 2023-07-21 DIAGNOSIS — E860 Dehydration: Secondary | ICD-10-CM | POA: Diagnosis present

## 2023-07-21 DIAGNOSIS — E872 Acidosis, unspecified: Secondary | ICD-10-CM | POA: Insufficient documentation

## 2023-07-21 DIAGNOSIS — E1169 Type 2 diabetes mellitus with other specified complication: Secondary | ICD-10-CM | POA: Diagnosis present

## 2023-07-21 DIAGNOSIS — W19XXXA Unspecified fall, initial encounter: Secondary | ICD-10-CM

## 2023-07-21 DIAGNOSIS — Z1152 Encounter for screening for COVID-19: Secondary | ICD-10-CM | POA: Diagnosis not present

## 2023-07-21 DIAGNOSIS — E871 Hypo-osmolality and hyponatremia: Secondary | ICD-10-CM

## 2023-07-21 DIAGNOSIS — E1159 Type 2 diabetes mellitus with other circulatory complications: Secondary | ICD-10-CM | POA: Diagnosis present

## 2023-07-21 DIAGNOSIS — E43 Unspecified severe protein-calorie malnutrition: Secondary | ICD-10-CM | POA: Insufficient documentation

## 2023-07-21 DIAGNOSIS — J690 Pneumonitis due to inhalation of food and vomit: Secondary | ICD-10-CM | POA: Insufficient documentation

## 2023-07-21 DIAGNOSIS — F17210 Nicotine dependence, cigarettes, uncomplicated: Secondary | ICD-10-CM | POA: Diagnosis present

## 2023-07-21 DIAGNOSIS — Z79899 Other long term (current) drug therapy: Secondary | ICD-10-CM

## 2023-07-21 DIAGNOSIS — Z66 Do not resuscitate: Secondary | ICD-10-CM | POA: Diagnosis not present

## 2023-07-21 DIAGNOSIS — J9601 Acute respiratory failure with hypoxia: Secondary | ICD-10-CM | POA: Diagnosis not present

## 2023-07-21 DIAGNOSIS — Z888 Allergy status to other drugs, medicaments and biological substances status: Secondary | ICD-10-CM

## 2023-07-21 DIAGNOSIS — Z820 Family history of epilepsy and other diseases of the nervous system: Secondary | ICD-10-CM

## 2023-07-21 DIAGNOSIS — Z9289 Personal history of other medical treatment: Secondary | ICD-10-CM | POA: Insufficient documentation

## 2023-07-21 DIAGNOSIS — F419 Anxiety disorder, unspecified: Secondary | ICD-10-CM | POA: Diagnosis present

## 2023-07-21 DIAGNOSIS — E785 Hyperlipidemia, unspecified: Secondary | ICD-10-CM | POA: Diagnosis present

## 2023-07-21 DIAGNOSIS — M6282 Rhabdomyolysis: Secondary | ICD-10-CM | POA: Diagnosis not present

## 2023-07-21 DIAGNOSIS — Z930 Tracheostomy status: Secondary | ICD-10-CM

## 2023-07-21 DIAGNOSIS — L2489 Irritant contact dermatitis due to other agents: Secondary | ICD-10-CM | POA: Diagnosis present

## 2023-07-21 DIAGNOSIS — R471 Dysarthria and anarthria: Principal | ICD-10-CM

## 2023-07-21 LAB — CBC WITH DIFFERENTIAL/PLATELET
Abs Immature Granulocytes: 0.06 10*3/uL (ref 0.00–0.07)
Basophils Absolute: 0 10*3/uL (ref 0.0–0.1)
Basophils Relative: 0 %
Eosinophils Absolute: 0 10*3/uL (ref 0.0–0.5)
Eosinophils Relative: 0 %
HCT: 39.2 % (ref 39.0–52.0)
Hemoglobin: 13.2 g/dL (ref 13.0–17.0)
Immature Granulocytes: 1 %
Lymphocytes Relative: 2 %
Lymphs Abs: 0.2 10*3/uL — ABNORMAL LOW (ref 0.7–4.0)
MCH: 31.1 pg (ref 26.0–34.0)
MCHC: 33.7 g/dL (ref 30.0–36.0)
MCV: 92.5 fL (ref 80.0–100.0)
Monocytes Absolute: 0.8 10*3/uL (ref 0.1–1.0)
Monocytes Relative: 7 %
Neutro Abs: 10.5 10*3/uL — ABNORMAL HIGH (ref 1.7–7.7)
Neutrophils Relative %: 90 %
Platelets: 204 10*3/uL (ref 150–400)
RBC: 4.24 MIL/uL (ref 4.22–5.81)
RDW: 16.9 % — ABNORMAL HIGH (ref 11.5–15.5)
WBC: 11.5 10*3/uL — ABNORMAL HIGH (ref 4.0–10.5)
nRBC: 0 % (ref 0.0–0.2)

## 2023-07-21 LAB — LACTIC ACID, PLASMA
Lactic Acid, Venous: 1.8 mmol/L (ref 0.5–1.9)
Lactic Acid, Venous: 1.5 mmol/L (ref 0.5–1.9)

## 2023-07-21 LAB — CK: Total CK: 1079 U/L — ABNORMAL HIGH (ref 49–397)

## 2023-07-21 LAB — COMPREHENSIVE METABOLIC PANEL
ALT: 28 U/L (ref 0–44)
AST: 74 U/L — ABNORMAL HIGH (ref 15–41)
Albumin: 3.6 g/dL (ref 3.5–5.0)
Alkaline Phosphatase: 93 U/L (ref 38–126)
Anion gap: 18 — ABNORMAL HIGH (ref 5–15)
BUN: 49 mg/dL — ABNORMAL HIGH (ref 8–23)
CO2: 23 mmol/L (ref 22–32)
Calcium: 11.7 mg/dL — ABNORMAL HIGH (ref 8.9–10.3)
Chloride: 97 mmol/L — ABNORMAL LOW (ref 98–111)
Creatinine, Ser: 0.76 mg/dL (ref 0.61–1.24)
GFR, Estimated: >60 mL/min (ref >60)
Glucose, Bld: 163 mg/dL — ABNORMAL HIGH (ref 70–99)
Potassium: 4.4 mmol/L (ref 3.5–5.1)
Sodium: 140 mmol/L (ref 135–145)
Total Bilirubin: 1.7 mg/dL — ABNORMAL HIGH (ref 0.0–1.2)
Total Protein: 6.8 g/dL (ref 6.5–8.1)

## 2023-07-21 LAB — RESP PANEL BY RT-PCR (RSV, FLU A&B, COVID)  RVPGX2
Influenza A by PCR: NEGATIVE
Influenza B by PCR: NEGATIVE
Resp Syncytial Virus by PCR: NEGATIVE
SARS Coronavirus 2 by RT PCR: NEGATIVE

## 2023-07-21 LAB — TROPONIN I (HIGH SENSITIVITY)
Troponin I (High Sensitivity): 26 ng/L — ABNORMAL HIGH (ref <18)
Troponin I (High Sensitivity): 29 ng/L — ABNORMAL HIGH (ref <18)

## 2023-07-21 MED ORDER — BUPROPION HCL ER (SR) 150 MG PO TB12
150.0000 mg | ORAL_TABLET | Freq: Every day | ORAL | Status: DC
  Administered 2023-07-22: 150 mg via ORAL
  Filled 2023-07-21: qty 1

## 2023-07-21 MED ORDER — SUCRALFATE 1 G PO TABS
1.0000 g | ORAL_TABLET | Freq: Three times a day (TID) | ORAL | Status: DC
  Administered 2023-07-22 (×3): 1 g via ORAL
  Filled 2023-07-21 (×4): qty 1

## 2023-07-21 MED ORDER — OXYCODONE HCL 5 MG/5ML PO SOLN: 5.0000 mg | Freq: Four times a day (QID) | ORAL | Status: DC | PRN

## 2023-07-21 MED ORDER — TRIAMCINOLONE ACETONIDE 0.025 % EX OINT: 1.0000 | TOPICAL_OINTMENT | Freq: Two times a day (BID) | CUTANEOUS | Status: DC

## 2023-07-21 MED ORDER — ASPIRIN 81 MG PO CHEW
81.0000 mg | CHEWABLE_TABLET | Freq: Every day | ORAL | Status: DC
  Filled 2023-07-21: qty 1

## 2023-07-21 MED ORDER — TRAZODONE HCL 50 MG PO TABS: 25.0000 mg | ORAL_TABLET | Freq: Every evening | ORAL | Status: DC | PRN

## 2023-07-21 MED ORDER — ENOXAPARIN SODIUM 40 MG/0.4ML IJ SOSY
40.0000 mg | PREFILLED_SYRINGE | INTRAMUSCULAR | Status: DC
  Administered 2023-07-22 – 2023-07-23 (×2): 40 mg via SUBCUTANEOUS
  Filled 2023-07-21 (×2): qty 0.4

## 2023-07-21 MED ORDER — SILVER SULFADIAZINE 1 % EX CREA: 1.0000 | TOPICAL_CREAM | Freq: Every day | CUTANEOUS | Status: DC

## 2023-07-21 MED ORDER — ONDANSETRON HCL 4 MG PO TABS: 4.0000 mg | ORAL_TABLET | Freq: Four times a day (QID) | ORAL | Status: DC | PRN

## 2023-07-21 MED ORDER — SODIUM CHLORIDE 0.9 % IV BOLUS
1000.0000 mL | Freq: Once | INTRAVENOUS | Status: AC
  Administered 2023-07-21: 1000 mL via INTRAVENOUS

## 2023-07-21 MED ORDER — ONDANSETRON HCL 4 MG/2ML IJ SOLN: 4.0000 mg | Freq: Four times a day (QID) | INTRAMUSCULAR | Status: DC | PRN

## 2023-07-21 MED ORDER — SODIUM CHLORIDE 0.9 % IV SOLN: INTRAVENOUS | Status: AC

## 2023-07-21 MED ORDER — ACETAMINOPHEN 325 MG PO TABS
650.0000 mg | ORAL_TABLET | Freq: Four times a day (QID) | ORAL | Status: DC | PRN
Start: 2023-07-21 — End: 2023-07-29

## 2023-07-21 MED ORDER — ACETAMINOPHEN 650 MG RE SUPP: 650.0000 mg | Freq: Four times a day (QID) | RECTAL | Status: DC | PRN

## 2023-07-21 MED ORDER — MAGNESIUM HYDROXIDE 400 MG/5ML PO SUSP: 30.0000 mL | Freq: Every day | ORAL | Status: DC | PRN

## 2023-07-21 NOTE — H&P (Signed)
 [place]   PATIENT NAME: Shane Perry    MR#:  [id number]  DATE OF BIRTH:  01/17/1956  DATE OF ADMISSION:  07/21/2023  PRIMARY CARE PHYSICIAN: Gladystine Erminio CROME, MD   Patient is coming from: Home  REQUESTING/REFERRING PHYSICIAN: Dicky Anes, MD  CHIEF COMPLAINT:   Chief Complaint  Patient presents with   Altered Mental Status    HISTORY OF PRESENT ILLNESS:  Corday L Berquist is a 68 y.o. African-American male with medical history significant for type diabetes mellitus, peripheral neuropathy, hypertension, dyslipidemia, urolithiasis and lung cancer status postchemotherapy and radiotherapy as well as glossectomy, and modified neck dissection on 03/14/2023 with tracheostomy and PEG tube placement who cleared show, who presented to the emergency room with acute onset of altered mental status with confusion and disorientation.  He had a fall a couple days ago and was seen in the ER at [facility].  Workup was negative and he was discharged home.  He denied any chest pain or palpitations.  No reported cough or wheezing or hemoptysis.  No reported dysuria, oliguria or hematuria or flank pain.  No reported paresthesias or focal muscle weakness.  ED Course: When the patient came to the ER, she was nine 4.4 with otherwise normal vital signs.  BP was later 140/100.  Labs revealed chloride of 97 and a BUN of 49 with a creatinine 0.76, blood glucose of 163 and anion gap of 18 and calcium  11.7 and AST 74 with total bili 1.7.  Total CK was 1079 and high sensitive troponin I was 2629.  Lactic acid was 1.8 and later 1.5 and CBC showed leukocytosis of 11.5 with mild neutrophilia respiratory panel came back negative.   EKG as reviewed by me : None. Imaging: Noncontrast head CT scan revealed no acute intracranial normality and C-spine CT showed no fracture or dislocation.  It showed no cervical nodal metastatic disease though.  Portable chest x-ray showed no acute cardiopulmonary  disease.  The patient was given 1 L bolus of IV normal saline.  He will be admitted to a medical telemetry bed for further evaluation and management. PAST MEDICAL HISTORY:   Past Medical History:  Diagnosis Date   Cancer (HCC)    Diabetes mellitus without complication (HCC)    Diabetic polyneuropathy associated with type 2 diabetes mellitus (HCC)    History of kidney stones    Hyperlipidemia    Hypertension    Kidney stones    Onychomycosis    Panlobular emphysema (HCC)    Squamous cell carcinoma, tongue border (HCC)    Tongue cancer (HCC)    Weight loss     PAST SURGICAL HISTORY:   Past Surgical History:  Procedure Laterality Date   COLONOSCOPY     COLONOSCOPY WITH PROPOFOL  N/A 04/26/2023   Procedure: COLONOSCOPY WITH PROPOFOL ;  Surgeon: Jinny Carmine, MD;  Location: [facility] ENDOSCOPY;  Service: Endoscopy;  Laterality: N/A;   CYST REMOVAL NECK     GASTROSTOMY N/A 05/10/2023   Procedure: INSERTION OF GASTROSTOMY TUBE, open;  Surgeon: Desiderio Schanz, MD;  Location: [facility] ORS;  Service: General;  Laterality: N/A;   IR IMAGING GUIDED PORT INSERTION  04/14/2023   left arm muscle removal for tongue     partial tongue removal  03/14/2023   PORTA CATH INSERTION Right    tongue cancer     TRACHEOSTOMY  03/14/2023    SOCIAL HISTORY:   Social History   Tobacco Use   Smoking status: Every Day  Current packs/day: 1.00    Average packs/day: 1 pack/day for 50.0 years (50.0 ttl pk-yrs)    Types: Cigarettes    Passive exposure: Past   Smokeless tobacco: Never  Substance Use Topics   Alcohol use: Not Currently    Alcohol/week: 1.0 standard drink of alcohol    Types: 1 Cans of beer per week    Comment: Rare    FAMILY HISTORY:   Family History  Problem Relation Age of Onset   Hypertension Mother    Alzheimer's disease Mother    Alzheimer's disease Father    Heart disease Sister    Diabetes type I Brother     DRUG ALLERGIES:   Allergies  Allergen Reactions   Statins  Other (See Comments)    Loss of balance   Varenicline Anxiety and Other (See Comments)    violent  Violent, agitation   Lisinopril Other (See Comments)    hoarseness    REVIEW OF SYSTEMS:   ROS As per history of present illness. All pertinent systems were reviewed above. Constitutional, HEENT, cardiovascular, respiratory, GI, GU, musculoskeletal, neuro, psychiatric, endocrine, integumentary and hematologic systems were reviewed and are otherwise negative/unremarkable except for positive findings mentioned above in the HPI.   MEDICATIONS AT HOME:   Prior to Admission medications   Medication Sig Start Date End Date Taking? Authorizing Provider  acetaminophen  (TYLENOL ) 160 MG/5ML solution Take 10 mLs (320 mg total) by mouth every 6 (six) hours as needed for moderate pain (pain score 4-6). 06/13/23   Dasie Tinnie MATSU, NP  aspirin  81 MG chewable tablet Chew 81 mg by mouth daily.    [provider]  Blood Glucose Monitoring Suppl (ONETOUCH VERIO FLEX SYSTEM) w/Device KIT by Does not apply route. 10/14/22 10/14/23  [provider]  buPROPion  (WELLBUTRIN  SR) 150 MG 12 hr tablet Take 150 mg by mouth daily.    [provider]  cephALEXin  (KEFLEX ) 500 MG capsule Take 1 capsule (500 mg total) by mouth 2 (two) times daily. 07/07/23   Borders, Fonda SAUNDERS, NP  glucose blood test strip  02/10/23 02/10/24  [provider]  insulin  lispro (HUMALOG) 100 UNIT/ML KwikPen INJECT 0-10 UNITS SUBCUTANEOUSLY WITH MEALS FOLLOWING SLIDING SCALE 05/27/23   [provider]  insulin  regular (NOVOLIN R) 100 units/mL injection Inject into the skin 3 (three) times daily before meals.    [provider]  lidocaine -prilocaine  (EMLA ) cream Apply to affected area once 03/29/23   Melanee Annah BROCKS, MD  magic mouthwash (multi-ingredient) oral suspension Swish and swallow 1 to 2 teaspoonsfuls 4 times a day 06/22/23     Nutritional Supplements (FEEDING SUPPLEMENT, OSMOLITE 1.5 CAL,)  LIQD Give 1.5 cartons 4 times a day via feeding tube.  Flush with 60ml of water  before and after each feeding. Provide additional 3 cups ( ) via feeding tube or orally to meet hydration needs.   Needs bolus supplies for tube feeding. 04/29/23   Melanee Annah BROCKS, MD  ondansetron  (ZOFRAN ) 8 MG tablet Take 1 tablet (8 mg total) by mouth every 8 (eight) hours as needed for nausea or vomiting. Start on the third day after cisplatin . 03/29/23   Melanee Annah BROCKS, MD  oxyCODONE  (ROXICODONE ) 5 MG/5ML solution Take 5 mLs (5 mg total) by mouth every 6 (six) hours as needed for severe pain (pain score 7-10). 06/13/23   Dasie Tinnie MATSU, NP  prochlorperazine  (COMPAZINE ) 10 MG tablet Take 1 tablet (10 mg total) by mouth every 6 (six) hours as needed (Nausea or vomiting).  03/29/23   Melanee Annah BROCKS, MD  silver  sulfADIAZINE  (SILVADENE ) 1 % cream Apply 1 Application topically daily. 07/07/23   Borders, Fonda SAUNDERS, NP  sucralfate  (CARAFATE ) 1 g tablet Take 1 tablet (1 g total) by mouth 4 (four) times daily -  with meals and at bedtime. 06/21/23   Lenn Aran, MD  triamcinolone  (KENALOG ) 0.025 % ointment Apply 1 Application topically 2 (two) times daily. 06/29/23   Melanee Annah BROCKS, MD      VITAL SIGNS:  Blood pressure (!) 148/69, pulse 90, temperature 99.1 F (37.3 C), temperature source Rectal, resp. rate 20, SpO2 95%.  PHYSICAL EXAMINATION:  Physical Exam  GENERAL:  68 y.o.-year-old African American male patient lying in the bed with no acute distress.  EYES: Pupils equal, round, reactive to light and accommodation. No scleral icterus. Extraocular muscles intact.  HEENT: Head atraumatic, normocephalic. Oropharynx and nasopharynx clear.  NECK:  Supple, no jugular venous distention. No thyroid enlargement, no tenderness.  LUNGS: Normal breath sounds bilaterally, no wheezing, rales,rhonchi or crepitation. No use of accessory muscles of respiration.  CARDIOVASCULAR: Regular rate and rhythm, S1, S2 normal. No murmurs,  rubs, or gallops.  ABDOMEN: Soft, nondistended, nontender. Bowel sounds present. No organomegaly or mass.  EXTREMITIES: No pedal edema, cyanosis, or clubbing.  NEUROLOGIC: Cranial nerves II through XII are intact with residual left facial droop from his head and neck surgery. Muscle strength 5/5 in all extremities as well as heavy speech. Sensation intact. Gait not checked.  PSYCHIATRIC: The patient is alert and oriented x 3.  Normal affect and good eye contact. SKIN: No obvious rash, lesion, or ulcer.   LABORATORY PANEL:   CBC Recent Labs  Lab 07/21/23 1804  WBC 11.5*  HGB 13.2  HCT 39.2  PLT 204   ------------------------------------------------------------------------------------------------------------------  Chemistries  Recent Labs  Lab 07/21/23 1759  NA 140  K 4.4  CL 97*  CO2 23  GLUCOSE 163*  BUN 49*  CREATININE 0.76  CALCIUM  11.7*  AST 74*  ALT 28  ALKPHOS 93  BILITOT 1.7*   ------------------------------------------------------------------------------------------------------------------  Cardiac Enzymes No results for input(s): TROPONINI in the last 168 hours. ------------------------------------------------------------------------------------------------------------------  RADIOLOGY:  DG Chest Portable 1 View Result Date: 07/21/2023 CLINICAL DATA:  Recent fall EXAM: PORTABLE CHEST 1 VIEW COMPARISON:  None FINDINGS: Cardiac shadow is within normal limits. Right chest wall port and tracheostomy tube are noted in satisfactory position. Lungs are clear bilaterally. Multiple skin folds are noted. No bony abnormality is seen. IMPRESSION: No active disease. Electronically Signed   By: Oneil Devonshire M.D.   On: 07/21/2023 20:48   CT Head Wo Contrast Result Date: 07/21/2023 CLINICAL DATA:  Head trauma, minor (Age >= 65y); Neck trauma (Age >= 65y). Unwitnessed fall. Altered mental status. History of tongue cancer. EXAM: CT HEAD WITHOUT CONTRAST CT CERVICAL SPINE  WITHOUT CONTRAST TECHNIQUE: Multidetector CT imaging of the head and cervical spine was performed following the standard protocol without intravenous contrast. Multiplanar CT image reconstructions of the cervical spine were also generated. RADIATION DOSE REDUCTION: This exam was performed according to the departmental dose-optimization program which includes automated exposure control, adjustment of the mA and/or kV according to patient size and/or use of iterative reconstruction technique. COMPARISON:  Head MRI 07/19/2023.  PET-04/06/2023. FINDINGS: CT HEAD FINDINGS Brain: There is no evidence of an acute infarct, intracranial hemorrhage, mass, midline shift, or extra-axial fluid collection. There is mild cerebral atrophy. 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 Vascular: Calcified atherosclerosis at the skull base.  No hyperdense vessel. Skull: No acute fracture or suspicious osseous lesion. Sinuses/Orbits: Small mucous retention cyst in the left maxillary sinus. Clear mastoid air cells. Unremarkable orbits. Other: None. CT CERVICAL SPINE FINDINGS Alignment: Normal. Skull base and vertebrae: No acute fracture or suspicious osseous lesion. Soft tissues and spinal canal: No prevertebral fluid or swelling. No visible canal hematoma. Disc levels: Cervical spondylosis and facet arthrosis. Moderate spinal stenosis and severe left greater than right neural foraminal stenosis at C5-6. Moderate left neural foraminal stenosis at C3-4 and C4-5. Upper chest: Emphysema. Other: Partially visualized tracheostomy. Partially visualized postsurgical changes in the left neck. 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 IMPRESSION: 1. No evidence of acute intracranial abnormality or acute cervical spine fracture. 2. Known cervical nodal metastatic disease. Electronically Signed   By: Dasie Hamburg M.D.   On: 07/21/2023 20:25   CT Cervical Spine Wo Contrast Result Date:  07/21/2023 CLINICAL DATA:  Head trauma, minor (Age >= 65y); Neck trauma (Age >= 65y). Unwitnessed fall. Altered mental status. History of tongue cancer. EXAM: CT HEAD WITHOUT CONTRAST CT CERVICAL SPINE WITHOUT CONTRAST TECHNIQUE: Multidetector CT imaging of the head and cervical spine was performed following the standard protocol without intravenous contrast. Multiplanar CT image reconstructions of the cervical spine were also generated. RADIATION DOSE REDUCTION: This exam was performed according to the departmental dose-optimization program which includes automated exposure control, adjustment of the mA and/or kV according to patient size and/or use of iterative reconstruction technique. COMPARISON:  Head MRI 07/19/2023.  PET-04/06/2023. FINDINGS: CT HEAD FINDINGS Brain: There is no evidence of an acute infarct, intracranial hemorrhage, mass, midline shift, or extra-axial fluid collection. There is mild cerebral atrophy. 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 Vascular: Calcified atherosclerosis at the skull base. No hyperdense vessel. Skull: No acute fracture or suspicious osseous lesion. Sinuses/Orbits: Small mucous retention cyst in the left maxillary sinus. Clear mastoid air cells. Unremarkable orbits. Other: None. CT CERVICAL SPINE FINDINGS Alignment: Normal. Skull base and vertebrae: No acute fracture or suspicious osseous lesion. Soft tissues and spinal canal: No prevertebral fluid or swelling. No visible canal hematoma. Disc levels: Cervical spondylosis and facet arthrosis. Moderate spinal stenosis and severe left greater than right neural foraminal stenosis at C5-6. Moderate left neural foraminal stenosis at C3-4 and C4-5. Upper chest: Emphysema. Other: Partially visualized tracheostomy. Partially visualized postsurgical changes in the left neck. 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 IMPRESSION: 1. No evidence of acute intracranial abnormality or  acute cervical spine fracture. 2. Known cervical nodal metastatic disease. Electronically Signed   By: Dasie Hamburg M.D.   On: 07/21/2023 20:25      IMPRESSION AND PLAN:  Assessment and Plan: * Rhabdomyolysis - This like secondary to his mechanical fall. - The patient be admitted to a medical telemetry bed - Will continue hydration with IV normal saline. - We will follow CK levels.  Hyponatremia - We will continue hydration with IV normal saline as mentioned above and follow sodium level.  Hypercalcemia - This likely secondary to volume depletion and dehydration. - The patient be hydrated as mentioned above we will follow calcium  level.  Type 2 diabetes mellitus with peripheral neuropathy (HCC) - The patient will be placed on supplemental coverage with NovoLog .  GERD without esophagitis - We will continue Carafate .  Depression - We will continue albuterol XL.   DVT prophylaxis: Lovenox ,  Advanced Care Planning:  Code Status: full code,  Family Communication:  The plan of care was discussed in details with the patient (and family). I answered all questions. The patient agreed to proceed with the above mentioned plan. Further management will depend upon hospital course. Disposition Plan: Back to previous home environment Consults called: none,  All the records are reviewed and case discussed with ED provider.  Status is: Inpatient  At the time of the admission, it appears that the appropriate admission status for this patient is inpatient.  This is judged to be reasonable and necessary in order to provide the required intensity of service to ensure the patient's safety given the presenting symptoms, physical exam findings and initial radiographic and laboratory data in the context of comorbid conditions.  The patient requires inpatient status due to high intensity of service, high risk of further deterioration and high frequency of surveillance required.  I certify that at the  time of admission, it is my clinical judgment that the patient will require inpatient hospital care extending more than 2 midnights.                            Dispo: The patient is from: Home              Anticipated d/c is to: Home              Patient currently is not medically stable to d/c.              Difficult to place patient: No  Madison DELENA Peaches M.D on 07/22/2023 at 2:13 AM  [facility]   From 7 PM-7 AM, contact night-coverage [email or web address]  CC: Primary care physician; Gladystine Erminio CROME, MD

## 2023-07-21 NOTE — Telephone Encounter (Signed)
 error

## 2023-07-21 NOTE — ED Notes (Addendum)
 Bair hugger applied.

## 2023-07-21 NOTE — ED Triage Notes (Signed)
 Pt to ED via [facility] from home. EMS reports pt had unwitnessed fall in the middle of the night. Family found pt this morning. Family reports increased AMS x2-3 days. Pt has tongue cancer, unsure if currently on chemo. Speech is at baseline per EMS. Pt was at [facility] on the 31st and negative workup for stroke. Rectal temp 94.4 in triage.   Pt has port, feeding tube and trach. Site over port has crusted abrasion skin and around feeding tube appears infected

## 2023-07-21 NOTE — ED Provider Notes (Signed)
 [facility] Provider Note    Event Date/Time   First MD Initiated Contact with Patient 07/21/23 1824     (approximate)   History   Altered Mental Status   HPI  Shane Perry is a 68 y.o. 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  Currently undergoing chemotherapy and radiation as of mid december    Patient's speech is somewhat thick but discernible, he does write some things that are difficult to understand on paper  Physical Exam   Triage Vital Signs: ED Triage Vitals  Encounter Vitals Group     BP 07/21/23 1759 127/82     Systolic BP Percentile --      Diastolic BP Percentile --      Pulse Rate 07/21/23 1759 97     Resp 07/21/23 1759 20     Temp 07/21/23 1759 (!) 94.4 F (34.7 C)     Temp Source 07/21/23 1759 Rectal     SpO2 07/21/23 1759 93 %     Weight --      Height --      Head Circumference --      Peak Flow --      Pain Score 07/21/23 1800 0     Pain Loc --      Pain Education --      Exclude from Growth Chart --     Most recent vital signs: Vitals:   07/24/23 0615 07/24/23 0630  BP:    Pulse: 88 93  Resp: (!) 9 15  Temp:    SpO2: 95% 95%     General: Awake, no distress. speech is very thick sort of hard to discern, seems to have occasional word finding difficulty.  Scale the lesion over the anterior chest patient reports chronic due to radiation CV:  Good peripheral  perfusion.  Normal tones and rate Resp:  Normal effort.  Clear bilateral Abd:  No distention.  Soft nontender nondistended Other:  Moves all extremities well no obvious deficits, slight left facial droop which on review of notes appears to be chronic due to his previous surgery and head neck cancer.  No obvious focal findings.  He does however demonstrate some difficulty in word finding and speech query if could be representative of small  central neurologic cause, though it is noted that his symptoms have been ongoing for greater than 72 hours.  Follows commands well moves all extremities appropriately   ED Results / Procedures / Treatments   Labs (all labs ordered are listed, but only abnormal results are displayed) Labs Reviewed  CBC WITH DIFFERENTIAL/PLATELET - Abnormal; Notable for the following components:      Result Value   WBC 11.5 (*)    RDW 16.9 (*)    Neutro Abs 10.5 (*)    Lymphs Abs 0.2 (*)    All other components within normal limits  COMPREHENSIVE METABOLIC PANEL - Abnormal; Notable for the following components:   Chloride 97 (*)    Glucose, Bld 163 (*)    BUN 49 (*)    Calcium  11.7 (*)    AST 74 (*)    Total Bilirubin 1.7 (*)    Anion gap 18 (*)    All other components within normal limits  CK - Abnormal; Notable for the following components:   Total CK 1,079 (*)    All other components within normal  limits  BASIC METABOLIC PANEL - Abnormal; Notable for the following components:   Glucose, Bld 139 (*)    BUN 45 (*)    Calcium  11.6 (*)    All other components within normal limits  CBC - Abnormal; Notable for the following components:   WBC 10.6 (*)    RBC 4.07 (*)    Hemoglobin 12.6 (*)    HCT 38.0 (*)    RDW 17.1 (*)    All other components within normal limits  CK - Abnormal; Notable for the following components:   Total CK 541 (*)    All other components within normal limits  PARATHYROID  HORMONE, INTACT (NO CA) - Abnormal; Notable for the following components:   PTH 8 (*)    All other components within normal limits  CALCITRIOL  (1,25 DI-OH VIT D) - Abnormal; Notable for the following components:   Vit D, 1,25-Dihydroxy 16.7 (*)    All other components within normal limits  PHOSPHORUS - Abnormal; Notable for the following components:   Phosphorus 2.2 (*)    All other components within normal limits  GLUCOSE, CAPILLARY - Abnormal; Notable for the following components:    Glucose-Capillary 131 (*)    All other components within normal limits  PHOSPHORUS - Abnormal; Notable for the following components:   Phosphorus 1.6 (*)    All other components within normal limits  GLUCOSE, CAPILLARY - Abnormal; Notable for the following components:   Glucose-Capillary 148 (*)    All other components within normal limits  GLUCOSE, CAPILLARY - Abnormal; Notable for the following components:   Glucose-Capillary 158 (*)    All other components within normal limits  BLOOD GAS, ARTERIAL - Abnormal; Notable for the following components:   pH, Arterial 7.34 (*)    pCO2 arterial 57 (*)    pO2, Arterial 70 (*)    Bicarbonate 30.8 (*)    Acid-Base Excess 3.5 (*)    All other components within normal limits  COMPREHENSIVE METABOLIC PANEL - Abnormal; Notable for the following components:   Sodium 146 (*)    Glucose, Bld 159 (*)    BUN 36 (*)    Calcium  11.1 (*)    Total Protein 6.1 (*)    Albumin 3.2 (*)    AST 44 (*)    All other components within normal limits  CBC WITH DIFFERENTIAL/PLATELET - Abnormal; Notable for the following components:   RBC 4.05 (*)    Hemoglobin 12.6 (*)    HCT 38.3 (*)    RDW 17.2 (*)    Neutro Abs 8.6 (*)    Lymphs Abs 0.1 (*)    All other components within normal limits  GLUCOSE, CAPILLARY - Abnormal; Notable for the following components:   Glucose-Capillary 163 (*)    All other components within normal limits  LACTIC ACID, PLASMA - Abnormal; Notable for the following components:   Lactic Acid, Venous 2.6 (*)    All other components within normal limits  GLUCOSE, CAPILLARY - Abnormal; Notable for the following components:   Glucose-Capillary 169 (*)    All other components within normal limits  GLUCOSE, CAPILLARY - Abnormal; Notable for the following components:   Glucose-Capillary 180 (*)    All other components within normal limits  PHOSPHORUS - Abnormal; Notable for the following components:   Phosphorus 1.8 (*)    All other  components within normal limits  BRAIN NATRIURETIC PEPTIDE - Abnormal; Notable for the following components:   B Natriuretic Peptide 343.1 (*)  All other components within normal limits  CBC - Abnormal; Notable for the following components:   RBC 3.66 (*)    Hemoglobin 11.2 (*)    HCT 33.4 (*)    RDW 17.4 (*)    All other components within normal limits  COMPREHENSIVE METABOLIC PANEL - Abnormal; Notable for the following components:   Sodium 146 (*)    Potassium 3.3 (*)    Glucose, Bld 156 (*)    BUN 35 (*)    Calcium  11.0 (*)    Total Protein 5.7 (*)    Albumin 2.9 (*)    All other components within normal limits  GLUCOSE, CAPILLARY - Abnormal; Notable for the following components:   Glucose-Capillary 164 (*)    All other components within normal limits  GLUCOSE, CAPILLARY - Abnormal; Notable for the following components:   Glucose-Capillary 147 (*)    All other components within normal limits  GLUCOSE, CAPILLARY - Abnormal; Notable for the following components:   Glucose-Capillary 154 (*)    All other components within normal limits  GLUCOSE, CAPILLARY - Abnormal; Notable for the following components:   Glucose-Capillary 158 (*)    All other components within normal limits  PHOSPHORUS - Abnormal; Notable for the following components:   Phosphorus 1.9 (*)    All other components within normal limits  CBC - Abnormal; Notable for the following components:   RBC 3.62 (*)    Hemoglobin 11.4 (*)    HCT 34.8 (*)    RDW 17.6 (*)    Platelets 132 (*)    All other components within normal limits  COMPREHENSIVE METABOLIC PANEL - Abnormal; Notable for the following components:   Glucose, Bld 182 (*)    BUN 45 (*)    Total Protein 5.6 (*)    Albumin 2.7 (*)    All other components within normal limits  GLUCOSE, CAPILLARY - Abnormal; Notable for the following components:   Glucose-Capillary 152 (*)    All other components within normal limits  GLUCOSE, CAPILLARY - Abnormal;  Notable for the following components:   Glucose-Capillary 173 (*)    All other components within normal limits  GLUCOSE, CAPILLARY - Abnormal; Notable for the following components:   Glucose-Capillary 169 (*)    All other components within normal limits  CBG MONITORING, ED - Abnormal; Notable for the following components:   Glucose-Capillary 127 (*)    All other components within normal limits  CBG MONITORING, ED - Abnormal; Notable for the following components:   Glucose-Capillary 156 (*)    All other components within normal limits  TROPONIN I (HIGH SENSITIVITY) - Abnormal; Notable for the following components:   Troponin I (High Sensitivity) 26 (*)    All other components within normal limits  TROPONIN I (HIGH SENSITIVITY) - Abnormal; Notable for the following components:   Troponin I (High Sensitivity) 29 (*)    All other components within normal limits  RESP PANEL BY RT-PCR (RSV, FLU A&B, COVID)  RVPGX2  MRSA NEXT GEN BY PCR, NASAL  LACTIC ACID, PLASMA  LACTIC ACID, PLASMA  HIV ANTIBODY (ROUTINE TESTING W REFLEX)  MAGNESIUM   CK  MAGNESIUM   MAGNESIUM   LACTIC ACID, PLASMA  AMMONIA  MAGNESIUM   CK  MAGNESIUM   PTH-RELATED PEPTIDE  MAGNESIUM   PHOSPHORUS     EKG  EKG interpreted at 8 PM heart rate 130 QRS 120 QTc 430 Sinus tachycardia, occasional PVC.  Incomplete left bundle branch like morphology   RADIOLOGY  CT head and cervical spine  interpreted by me as grossly negative for fracture or acute finding  No results found.     PROCEDURES:  Critical Care performed: No  Procedures     IMPRESSION / MDM / ASSESSMENT AND PLAN / ED COURSE  I reviewed the triage vital signs and the nursing notes.                              Differential diagnosis includes, but is not limited to, possible subtle stroke, metabolic abnormality encephalopathy advancing cancer tumor subtle infection etc.  Differential gnosis quite broad but discussing with the patient's brother  who also arrived to bedside for at least 3 days now and since having been in any pain and also discovered to have been confused driving his car a few days ago his speech has been difficult to understand he has been a bit confused, and fell last night.  He does not appear to have suffered any obvious acute injury.  Patient's presentation is most consistent with acute complicated illness / injury requiring diagnostic workup.      Clinical Course as of 07/24/23 0725  Thu Jul 21, 2023  2135 Dr. Lawence has seen and is admitting after having been consulted [MQ]  2135 Also arrivedPatient's brother noticed that for about 3 days since [MQ]  2135  having been at [facility] he is not been able to understand his brother speech very well for 2-3 days and seems a bit confused still since they found him confused in his care a few days back [MQ]    Clinical Course User Index [MQ] Dicky Anes, MD   Discussed with the patient and his brother understanding with plan for admission.  No obvious sores or lesions noted on examination.  CK is elevated consistent with mild rhabdomyolysis, very mild leukocytosis no obvious infectious source identified  ----------------------------------------- 11:56 PM on 07/21/2023 ----------------------------------------- Noted that pending metabolic panel has not yet resulted.  Nursing to evaluate and redraw/send.  FINAL CLINICAL IMPRESSION(S) / ED DIAGNOSES   Final diagnoses:  Dysarthria  Traumatic rhabdomyolysis, initial encounter [facility])  Fall, initial encounter     Rx / DC Orders   ED Discharge Orders     None        Note:  This document was prepared using Dragon voice recognition software and may include unintentional dictation errors.   Dicky Anes, MD 07/24/23 [id number]

## 2023-07-21 NOTE — ED Notes (Addendum)
 Bair hugger removed

## 2023-07-21 NOTE — ED Provider Triage Note (Signed)
 Emergency Medicine Provider Triage Evaluation Note  Shane Perry , a 68 y.o. male  was evaluated in triage.  Pt complains of EMS reporting sepsis, due to decreased end tidal, tachypnea and tachycardia. CO was reading 21. Ems was called bc he fell sometime during the middle of the night, family went to check on him and found him on the ground. Patient was seen at [facility] because he was altered 3 days ago. Work up was negative for stroke at [facility].   EMS says he had about 200 cc of fluid on the way here.   Review of Systems  Positive: Multiple skin infections and open wounds Negative:   Physical Exam  There were no vitals taken for this visit. Gen:   Awake, no distress   Resp:  Normal effort  MSK:   Moves extremities without difficulty  Other:  Patient has an indwelling catheter, feeding tube, hematoma to the back of the head, facial swelling due to tongue cancer, speech is at baseline,   Medical Decision Making  Medically screening exam initiated at 5:41 PM.  Appropriate orders placed.  Shane Perry was informed that the remainder of the evaluation will be completed by another provider, this initial triage assessment does not replace that evaluation, and the importance of remaining in the ED until their evaluation is complete.     Shane Perry LABOR, PA-C 07/21/23 1750

## 2023-07-21 NOTE — ED Notes (Signed)
 Troponin 26. MD made aware.

## 2023-07-22 ENCOUNTER — Inpatient Hospital Stay: Payer: Medicare Other

## 2023-07-22 ENCOUNTER — Encounter: Payer: Self-pay | Admitting: Oncology

## 2023-07-22 DIAGNOSIS — E871 Hypo-osmolality and hyponatremia: Secondary | ICD-10-CM

## 2023-07-22 DIAGNOSIS — E43 Unspecified severe protein-calorie malnutrition: Secondary | ICD-10-CM | POA: Insufficient documentation

## 2023-07-22 DIAGNOSIS — Z515 Encounter for palliative care: Secondary | ICD-10-CM | POA: Diagnosis not present

## 2023-07-22 DIAGNOSIS — Z931 Gastrostomy status: Secondary | ICD-10-CM

## 2023-07-22 DIAGNOSIS — Z930 Tracheostomy status: Secondary | ICD-10-CM

## 2023-07-22 DIAGNOSIS — E1142 Type 2 diabetes mellitus with diabetic polyneuropathy: Secondary | ICD-10-CM

## 2023-07-22 DIAGNOSIS — M6282 Rhabdomyolysis: Secondary | ICD-10-CM | POA: Diagnosis not present

## 2023-07-22 DIAGNOSIS — K219 Gastro-esophageal reflux disease without esophagitis: Secondary | ICD-10-CM | POA: Insufficient documentation

## 2023-07-22 DIAGNOSIS — F32A Depression, unspecified: Secondary | ICD-10-CM | POA: Insufficient documentation

## 2023-07-22 LAB — COMPREHENSIVE METABOLIC PANEL
ALT: 28 U/L (ref 0–44)
AST: 44 U/L — ABNORMAL HIGH (ref 15–41)
Albumin: 3.2 g/dL — ABNORMAL LOW (ref 3.5–5.0)
Alkaline Phosphatase: 76 U/L (ref 38–126)
Anion gap: 14 (ref 5–15)
BUN: 36 mg/dL — ABNORMAL HIGH (ref 8–23)
CO2: 29 mmol/L (ref 22–32)
Calcium: 11.1 mg/dL — ABNORMAL HIGH (ref 8.9–10.3)
Chloride: 103 mmol/L (ref 98–111)
Creatinine, Ser: 0.76 mg/dL (ref 0.61–1.24)
GFR, Estimated: >60 mL/min (ref >60)
Glucose, Bld: 159 mg/dL — ABNORMAL HIGH (ref 70–99)
Potassium: 3.9 mmol/L (ref 3.5–5.1)
Sodium: 146 mmol/L — ABNORMAL HIGH (ref 135–145)
Total Bilirubin: 0.9 mg/dL (ref 0.0–1.2)
Total Protein: 6.1 g/dL — ABNORMAL LOW (ref 6.5–8.1)

## 2023-07-22 LAB — GLUCOSE, CAPILLARY
Glucose-Capillary: 131 mg/dL — ABNORMAL HIGH (ref 70–99)
Glucose-Capillary: 148 mg/dL — ABNORMAL HIGH (ref 70–99)
Glucose-Capillary: 158 mg/dL — ABNORMAL HIGH (ref 70–99)
Glucose-Capillary: 163 mg/dL — ABNORMAL HIGH (ref 70–99)
Glucose-Capillary: 169 mg/dL — ABNORMAL HIGH (ref 70–99)
Glucose-Capillary: 180 mg/dL — ABNORMAL HIGH (ref 70–99)

## 2023-07-22 LAB — CBG MONITORING, ED
Glucose-Capillary: 127 mg/dL — ABNORMAL HIGH (ref 70–99)
Glucose-Capillary: 156 mg/dL — ABNORMAL HIGH (ref 70–99)

## 2023-07-22 LAB — CBC WITH DIFFERENTIAL/PLATELET
Abs Immature Granulocytes: 0.03 10*3/uL (ref 0.00–0.07)
Basophils Absolute: 0 10*3/uL (ref 0.0–0.1)
Basophils Relative: 0 %
Eosinophils Absolute: 0 10*3/uL (ref 0.0–0.5)
Eosinophils Relative: 0 %
HCT: 38.3 % — ABNORMAL LOW (ref 39.0–52.0)
Hemoglobin: 12.6 g/dL — ABNORMAL LOW (ref 13.0–17.0)
Immature Granulocytes: 0 %
Lymphocytes Relative: 1 %
Lymphs Abs: 0.1 10*3/uL — ABNORMAL LOW (ref 0.7–4.0)
MCH: 31.1 pg (ref 26.0–34.0)
MCHC: 32.9 g/dL (ref 30.0–36.0)
MCV: 94.6 fL (ref 80.0–100.0)
Monocytes Absolute: 0.6 10*3/uL (ref 0.1–1.0)
Monocytes Relative: 7 %
Neutro Abs: 8.6 10*3/uL — ABNORMAL HIGH (ref 1.7–7.7)
Neutrophils Relative %: 92 %
Platelets: 161 10*3/uL (ref 150–400)
RBC: 4.05 MIL/uL — ABNORMAL LOW (ref 4.22–5.81)
RDW: 17.2 % — ABNORMAL HIGH (ref 11.5–15.5)
WBC: 9.3 10*3/uL (ref 4.0–10.5)
nRBC: 0 % (ref 0.0–0.2)

## 2023-07-22 LAB — LACTIC ACID, PLASMA: Lactic Acid, Venous: 2.6 mmol/L (ref 0.5–1.9)

## 2023-07-22 LAB — BASIC METABOLIC PANEL
Anion gap: 14 (ref 5–15)
BUN: 45 mg/dL — ABNORMAL HIGH (ref 8–23)
CO2: 27 mmol/L (ref 22–32)
Calcium: 11.6 mg/dL — ABNORMAL HIGH (ref 8.9–10.3)
Chloride: 100 mmol/L (ref 98–111)
Creatinine, Ser: 0.68 mg/dL (ref 0.61–1.24)
GFR, Estimated: >60 mL/min (ref >60)
Glucose, Bld: 139 mg/dL — ABNORMAL HIGH (ref 70–99)
Potassium: 3.7 mmol/L (ref 3.5–5.1)
Sodium: 141 mmol/L (ref 135–145)

## 2023-07-22 LAB — PHOSPHORUS: Phosphorus: 2.2 mg/dL — ABNORMAL LOW (ref 2.5–4.6)

## 2023-07-22 LAB — MAGNESIUM: Magnesium: 1.8 mg/dL (ref 1.7–2.4)

## 2023-07-22 LAB — CBC
HCT: 38 % — ABNORMAL LOW (ref 39.0–52.0)
Hemoglobin: 12.6 g/dL — ABNORMAL LOW (ref 13.0–17.0)
MCH: 31 pg (ref 26.0–34.0)
MCHC: 33.2 g/dL (ref 30.0–36.0)
MCV: 93.4 fL (ref 80.0–100.0)
Platelets: 180 10*3/uL (ref 150–400)
RBC: 4.07 MIL/uL — ABNORMAL LOW (ref 4.22–5.81)
RDW: 17.1 % — ABNORMAL HIGH (ref 11.5–15.5)
WBC: 10.6 10*3/uL — ABNORMAL HIGH (ref 4.0–10.5)
nRBC: 0 % (ref 0.0–0.2)

## 2023-07-22 LAB — HIV ANTIBODY (ROUTINE TESTING W REFLEX): HIV Screen 4th Generation wRfx: NONREACTIVE

## 2023-07-22 LAB — CK: Total CK: 541 U/L — ABNORMAL HIGH (ref 49–397)

## 2023-07-22 LAB — BLOOD GAS, ARTERIAL

## 2023-07-22 LAB — MRSA NEXT GEN BY PCR, NASAL: MRSA by PCR Next Gen: NOT DETECTED

## 2023-07-22 MED ORDER — DEXTROSE 5 % IV SOLN: INTRAVENOUS | Status: AC

## 2023-07-22 MED ORDER — MAGNESIUM HYDROXIDE 400 MG/5ML PO SUSP
30.0000 mL | Freq: Every day | ORAL | Status: DC | PRN
Start: 2023-07-22 — End: 2023-07-24

## 2023-07-22 MED ORDER — ZOLEDRONIC ACID 4 MG/100ML IV SOLN
4.0000 mg | Freq: Once | INTRAVENOUS | Status: AC
  Administered 2023-07-22: 4 mg via INTRAVENOUS
  Filled 2023-07-22 (×2): qty 100

## 2023-07-22 MED ORDER — LORAZEPAM 2 MG/ML IJ SOLN
INTRAMUSCULAR | Status: AC
  Administered 2023-07-22: 2 mg
  Filled 2023-07-22: qty 1

## 2023-07-22 MED ORDER — METOPROLOL TARTRATE 5 MG/5ML IV SOLN
5.0000 mg | Freq: Once | INTRAVENOUS | Status: AC
  Administered 2023-07-22: 5 mg via INTRAVENOUS
  Filled 2023-07-22: qty 5

## 2023-07-22 MED ORDER — MAGNESIUM SULFATE 2 GM/50ML IV SOLN
2.0000 g | Freq: Once | INTRAVENOUS | Status: AC
  Administered 2023-07-22: 2 g via INTRAVENOUS
  Filled 2023-07-22: qty 50

## 2023-07-22 MED ORDER — FAMOTIDINE 20 MG PO TABS
20.0000 mg | ORAL_TABLET | Freq: Two times a day (BID) | ORAL | Status: DC
  Administered 2023-07-22 – 2023-07-24 (×4): 20 mg
  Filled 2023-07-22 (×4): qty 1

## 2023-07-22 MED ORDER — CHLORHEXIDINE GLUCONATE CLOTH 2 % EX PADS
6.0000 | MEDICATED_PAD | Freq: Every day | CUTANEOUS | Status: DC
  Administered 2023-07-23 – 2023-07-24 (×3): 6 via TOPICAL

## 2023-07-22 MED ORDER — ZINC OXIDE 20 % EX OINT
TOPICAL_OINTMENT | Freq: Every day | CUTANEOUS | Status: DC
  Filled 2023-07-22: qty 28.35
  Filled 2023-07-22: qty 56.7
  Filled 2023-07-22: qty 30

## 2023-07-22 MED ORDER — LORAZEPAM 2 MG/ML IJ SOLN
4.0000 mg | INTRAMUSCULAR | Status: AC | PRN
  Administered 2023-07-24 (×2): 4 mg via INTRAVENOUS
  Filled 2023-07-22 (×2): qty 2

## 2023-07-22 MED ORDER — FREE WATER
30.0000 mL | Status: DC
  Administered 2023-07-22 – 2023-07-24 (×8): 30 mL

## 2023-07-22 MED ORDER — SUCRALFATE 1 G PO TABS
1.0000 g | ORAL_TABLET | Freq: Three times a day (TID) | ORAL | Status: DC
  Administered 2023-07-23 – 2023-07-24 (×5): 1 g
  Filled 2023-07-22 (×5): qty 1

## 2023-07-22 MED ORDER — ORAL CARE MOUTH RINSE
15.0000 mL | OROMUCOSAL | Status: DC
  Administered 2023-07-22 – 2023-07-24 (×18): 15 mL via OROMUCOSAL

## 2023-07-22 MED ORDER — INSULIN ASPART 100 UNIT/ML IJ SOLN
0.0000 [IU] | INTRAMUSCULAR | Status: DC
  Administered 2023-07-22 – 2023-07-23 (×3): 2 [IU] via SUBCUTANEOUS
  Administered 2023-07-23: 1 [IU] via SUBCUTANEOUS
  Administered 2023-07-23 – 2023-07-24 (×5): 2 [IU] via SUBCUTANEOUS
  Filled 2023-07-22 (×9): qty 1

## 2023-07-22 MED ORDER — ADULT MULTIVITAMIN W/MINERALS CH
1.0000 | ORAL_TABLET | Freq: Every day | ORAL | Status: DC
  Administered 2023-07-22 – 2023-07-24 (×3): 1
  Filled 2023-07-22 (×3): qty 1

## 2023-07-22 MED ORDER — INSULIN ASPART 100 UNIT/ML IJ SOLN
0.0000 [IU] | Freq: Three times a day (TID) | INTRAMUSCULAR | Status: DC
Start: 2023-07-22 — End: 2023-07-22
  Administered 2023-07-22: 1 [IU] via SUBCUTANEOUS
  Administered 2023-07-22: 2 [IU] via SUBCUTANEOUS
  Filled 2023-07-22 (×2): qty 1

## 2023-07-22 MED ORDER — INSULIN ASPART 100 UNIT/ML IJ SOLN: 0.0000 [IU] | Freq: Every day | INTRAMUSCULAR | Status: DC

## 2023-07-22 MED ORDER — IOHEXOL 300 MG/ML  SOLN
100.0000 mL | Freq: Once | INTRAMUSCULAR | Status: AC | PRN
  Administered 2023-07-22: 100 mL via INTRAVENOUS

## 2023-07-22 MED ORDER — PROSOURCE TF20 ENFIT COMPATIBL EN LIQD
60.0000 mL | Freq: Two times a day (BID) | ENTERAL | Status: DC
  Administered 2023-07-23 – 2023-07-24 (×3): 60 mL
  Filled 2023-07-22 (×4): qty 60

## 2023-07-22 MED ORDER — OSMOLITE 1.2 CAL PO LIQD: 1000.0000 mL | ORAL | Status: DC

## 2023-07-22 MED ORDER — TRIAMCINOLONE ACETONIDE 0.025 % EX CREA: 1.0000 | TOPICAL_CREAM | Freq: Two times a day (BID) | CUTANEOUS | Status: DC | PRN

## 2023-07-22 MED ORDER — LEVETIRACETAM IN NACL 1500 MG/100ML IV SOLN
1500.0000 mg | Freq: Once | INTRAVENOUS | Status: AC
  Administered 2023-07-22: 1500 mg via INTRAVENOUS
  Filled 2023-07-22: qty 100

## 2023-07-22 MED ORDER — THIAMINE MONONITRATE 100 MG PO TABS
100.0000 mg | ORAL_TABLET | Freq: Every day | ORAL | Status: DC
  Administered 2023-07-22 – 2023-07-24 (×3): 100 mg
  Filled 2023-07-22 (×3): qty 1

## 2023-07-22 MED ORDER — LEVETIRACETAM IN NACL 500 MG/100ML IV SOLN
500.0000 mg | Freq: Two times a day (BID) | INTRAVENOUS | Status: DC
  Administered 2023-07-23 (×2): 500 mg via INTRAVENOUS
  Filled 2023-07-22 (×3): qty 100

## 2023-07-22 MED ORDER — LORAZEPAM 2 MG/ML IJ SOLN: 2.0000 mg | Freq: Once | INTRAMUSCULAR | Status: AC

## 2023-07-22 MED ORDER — ORAL CARE MOUTH RINSE: 15.0000 mL | OROMUCOSAL | Status: DC | PRN

## 2023-07-22 MED ORDER — OSMOLITE 1.5 CAL PO LIQD
1000.0000 mL | ORAL | Status: DC
  Administered 2023-07-22 – 2023-07-24 (×4): 1000 mL

## 2023-07-22 MED ORDER — OXYCODONE HCL 5 MG/5ML PO SOLN: 5.0000 mg | Freq: Four times a day (QID) | ORAL | Status: DC | PRN

## 2023-07-22 MED ORDER — SILVER SULFADIAZINE 1 % EX CREA
1.0000 | TOPICAL_CREAM | Freq: Every day | CUTANEOUS | Status: DC
  Administered 2023-07-22 – 2023-07-23 (×2): 1 via TOPICAL
  Filled 2023-07-22 (×2): qty 85

## 2023-07-22 MED ORDER — TRAZODONE HCL 50 MG PO TABS: 25.0000 mg | ORAL_TABLET | Freq: Every evening | ORAL | Status: DC | PRN

## 2023-07-22 MED ORDER — ZOLEDRONIC ACID 5 MG/100ML IV SOLN: 4.0000 mg | Freq: Once | INTRAVENOUS | Status: DC

## 2023-07-22 NOTE — Progress Notes (Signed)
 Pt transferred to ICU , report given to ICU RN . Vitals stable pt transferred with rapid response team and care RN.

## 2023-07-22 NOTE — Progress Notes (Signed)
 Physical Therapy Evaluation Patient Details Name: Shane Perry MRN: [id number] DOB: [birth year] Today's Date: 07/22/2023  History of Present Illness  Shane Perry is a 68 y.o. male with medical history significant for type diabetes mellitus, peripheral neuropathy, hypertension, dyslipidemia, urolithiasis and lung cancer status postchemotherapy and radiotherapy as well as glossectomy, and modified neck dissection on 03/14/2023 with tracheostomy and PEG tube placement who cleared show, who presented to the emergency room with acute onset of altered mental status with confusion and disorientation on 07/21/2023. Of note, pt was taken to ED at [facility] by [facility] on 07/19/2023  as pt was found driving on the wrong [place] [address]. MRI revealed 07/19/2023 Mild generalized cerebral atrophy.   Clinical Impression  Patient supine in bed upon arrival. Patient is poor historian and with very mumbled/gargled speech with therapist only able to understanding approx 25% of patient's conversation. , therefore history limited but from chart review patient lives alone. No family member at bedside at time of evaluation. From [facility] notes, patient's brother lives near patient and can provide support intermittently. Patient was ambulating without AD and driving prior to admission.   On evaluation, patient responded to gesture commands with redirection required intermittently. Patient able to complete bed mobility and stand with Min A w/ RW. Pt able to take few lateral steps along EOB with Min A and use of RW. With standing activity, patient desat to 87-88%. RN present and donned 2L supplemental oxygen. Patient left in bed with all needs in reach, alarm set and RN at bedside. Patient will benefit from skilled acute PT services to address functional impairments (see below for additional) and maximize functional mobility. Anticipate the need for follow up PT services upon acute hospital discharge. Will continue to follow  acutely.        If plan is discharge home, recommend the following: A little help with walking and/or transfers;A little help with bathing/dressing/bathroom;Help with stairs or ramp for entrance;Supervision due to cognitive status   Can travel by private vehicle   No    Equipment Recommendations Other (comment) (TBD at next venue)  Recommendations for Other Services       Functional Status Assessment Patient has had a recent decline in their functional status and demonstrates the ability to make significant improvements in function in a reasonable and predictable amount of time.     Precautions / Restrictions Precautions Precautions: Fall Precaution Comments: PEG Tube, Capped Trach Restrictions Weight Bearing Restrictions Per Provider Order: No      Mobility  Bed Mobility Overal bed mobility: Needs Assistance Bed Mobility: Supine to Sit, Sit to Supine     Supine to sit: Min assist Sit to supine: Min assist   General bed mobility comments: Pt require Min A for bed mobility; increased time and one step commands for completion. Pt needing redirection frequently during session. Reduced clarity of speech but patient tangential throughout session.    Transfers Overall transfer level: Needs assistance Equipment used: Rolling walker (2 wheels) Transfers: Sit to/from Stand Sit to Stand: Min assist           General transfer comment: Min A to stand from stretcher, Pt able to stand with use of RW. Pt desat with standing to 87-88% on RA; nurse present and donned 2L supplemental oxygen. Sp02 improved to mid 90's.    Ambulation/Gait               General Gait Details: Patient able to lateral step at EOB with  use of RW, 2-3 ft. Min A required  Stairs            Wheelchair Mobility     Tilt Bed    Modified Rankin (Stroke Patients Only)       Balance Overall balance assessment: Needs assistance Sitting-balance support: Feet supported, No upper extremity  supported Sitting balance-Leahy Scale: Good     Standing balance support: During functional activity, Bilateral upper extremity supported, Reliant on assistive device for balance Standing balance-Leahy Scale: Fair Standing balance comment: increased unsteadiness; improved with use of AD.                             Pertinent Vitals/Pain Pain Assessment Pain Assessment: No/denies pain    Home Living Family/patient expects to be discharged to:: Private residence Living Arrangements: Alone Available Help at Discharge: Other (Comment) (Unsure of family support)               Additional Comments: Patient poor historian, and very difficult to undersand. No family member at bedside to determine home environment    Prior Function Prior Level of Function : Independent/Modified Independent;Driving             Mobility Comments: Per chart review: patient lived alone prior to admission, was driving.       Extremity/Trunk Assessment   Upper Extremity Assessment Upper Extremity Assessment: Defer to OT evaluation    Lower Extremity Assessment Lower Extremity Assessment: Generalized weakness       Communication   Communication Communication: Other (comment);Difficulty communicating thoughts/reduced clarity of speech (Capped Trach; Patient with very mumbled/gargled speech, reduced clarity. PT unable to understand <25% of patient's speech.) Cueing Techniques: Verbal cues;Gestural cues  Cognition Arousal: Alert Behavior During Therapy: Restless Overall Cognitive Status: No family/caregiver present to determine baseline cognitive functioning                                          General Comments      Exercises     Assessment/Plan    PT Assessment Patient needs continued PT services  PT Problem List Decreased strength;Decreased activity tolerance;Decreased balance;Decreased mobility;Decreased knowledge of use of DME       PT Treatment  Interventions DME instruction;Gait training;Stair training;Functional mobility training;Therapeutic activities;Therapeutic exercise;Balance training;Neuromuscular re-education;Patient/family education    PT Goals (Current goals can be found in the Care Plan section)  Acute Rehab PT Goals PT Goal Formulation: Patient unable to participate in goal setting (PT unable to understand patient's speech for adequate goal formation) Time For Goal Achievement: 08/05/23 Potential to Achieve Goals: Fair    Frequency Min 1X/week     Co-evaluation               AM-PAC PT 6 Clicks Mobility  Outcome Measure Help needed turning from your back to your side while in a flat bed without using bedrails?: A Little Help needed moving from lying on your back to sitting on the side of a flat bed without using bedrails?: A Little Help needed moving to and from a bed to a chair (including a wheelchair)?: A Little Help needed standing up from a chair using your arms (e.g., wheelchair or bedside chair)?: A Little Help needed to walk in hospital room?: A Lot Help needed climbing 3-5 steps with a railing? : A Lot 6 Click Score: 16  End of Session Equipment Utilized During Treatment: Gait belt;Oxygen Activity Tolerance: Patient tolerated treatment well Patient left: in bed;with bed alarm set;with call bell/phone within reach;with nursing/sitter in room Nurse Communication: Mobility status PT Visit Diagnosis: Unsteadiness on feet (R26.81);Other abnormalities of gait and mobility (R26.89);Muscle weakness (generalized) (M62.81);History of falling (Z91.81)    Time: 8751-8693 PT Time Calculation (min) (ACUTE ONLY): 18 min   Charges:   PT Evaluation $PT Eval Moderate Complexity: 1 Mod   PT General Charges $$ ACUTE PT VISIT: 1 Visit         Carolyn CHRISTELLA Kingfisher, PT, DPT 07/22/23 4:06 PM

## 2023-07-22 NOTE — TOC Progression Note (Signed)
 Transition of Care [facility]) - Progression Note    Patient Details  Name: Shane Perry MRN: [id number] Date of Birth: [birth year]  Transition of Care [facility]) CM/SW Contact  Shalea Tomczak E Tyronne Blann, LCSW Phone Number: 07/22/2023, 3:35 PM  Clinical Narrative:    SNF work up started.   Expected Discharge Plan: Skilled Nursing Facility Barriers to Discharge: Continued Medical Work up  Expected Discharge Plan and Services       Living arrangements for the past 2 months: Single Family Home                                       Social Determinants of Health (SDOH) Interventions SDOH Screenings   Food Insecurity: Medium Risk (03/21/2023)   Received from [facility]  Housing: Low Risk  (03/21/2023)   Received from [facility]  Transportation Needs: No Transportation Needs (03/21/2023)   Received from [facility]  Utilities: Low Risk  (03/21/2023)   Received from [facility]  Recent Concern: Utilities - At Risk (02/18/2023)   Received from [facility]  Depression (PHQ2-9): Low Risk  (02/01/2023)  Financial Resource Strain: Patient Declined (02/18/2023)   Received from [facility]  Physical Activity: Insufficiently Active (02/18/2023)   Received from [facility]  Social Connections: Moderately Integrated (02/18/2023)   Received from [facility]  Stress: No Stress Concern Present (02/18/2023)   Received from [facility]  Tobacco Use: High Risk (07/21/2023)    Readmission Risk Interventions     No data to display

## 2023-07-22 NOTE — Progress Notes (Signed)
 PROGRESS NOTE    Shane Perry  FMW:[id number] DOB: 01-Feb-1956 DOA: 07/21/2023 PCP: Gladystine Erminio CROME, MD  Outpatient Specialists: oncology    Brief Narrative:   From admission h and p  Shane Perry is a 68 y.o. African-American male with medical history significant for type diabetes mellitus, peripheral neuropathy, hypertension, dyslipidemia, urolithiasis and lung cancer status postchemotherapy and radiotherapy as well as glossectomy, and modified neck dissection on 03/14/2023 with tracheostomy and PEG tube placement who cleared show, who presented to the emergency room with acute onset of altered mental status with confusion and disorientation.  He had a fall a couple days ago and was seen in the ER at [facility].  Workup was negative and he was discharged home.  He denied any chest pain or palpitations.  No reported cough or wheezing or hemoptysis.  No reported dysuria, oliguria or hematuria or flank pain.  No reported paresthesias or focal muscle weakness.   Assessment & Plan:   Principal Problem:   Rhabdomyolysis Active Problems:   Hyponatremia   Hypercalcemia   Type 2 diabetes mellitus with peripheral neuropathy (HCC)   Hypertension associated with diabetes (HCC)   Panlobular emphysema (HCC)   Tongue cancer (HCC)   Depression   GERD without esophagitis   Feeding by G-tube (HCC)   Tracheostomy dependence (HCC)  # Encephalopathy, metabolic Several days of confusion. Likely 2/2 hypercalcemia, dehydration. Ct head neg - continue fluids - treat underlying conditions - PT consult  # Rhabdomyolysis Likely 2/2 dehydration, fall. Ck 1000 on presentation improved to 500s - continue fluids and trend  # Hypercalcemia Likely of malignancy. Corrected calcium  is 12 - f/u pth, pthrp, vit d - continue fluids - given change in mentation will treat with zoledronic  acid x1  # SCC of tongue S/p radiation therapy, currently undergoing chemo - outpt f/u  # Rash on  chest Possibly 2/2 radiation. S/p course of abx by onc. Outpt plan is gen surg f/u for biopsy - continue w/ that plan  # GAD - home wellbutrin   # T2DM - SSI sensitive     DVT prophylaxis: lovenox  Code Status: full Family Communication: brother herbert updated telephonically 1/3.  Level of care: Telemetry Medical Status is: Inpatient Remains inpatient appropriate because: severity of illness    Consultants:  none  Procedures: none  Antimicrobials:  none    Subjective: confused  Objective: Vitals:   07/22/23 0400 07/22/23 0600 07/22/23 0630 07/22/23 0753  BP: 123/78 (!) 159/69    Pulse: 96 88 88   Resp: (!) 25 14 13    Temp:      TempSrc:      SpO2: 94% 95% 92% 96%    Intake/Output Summary (Last 24 hours) at 07/22/2023 0853 Last data filed at 07/21/2023 2018 Gross per 24 hour  Intake 1000 ml  Output --  Net 1000 ml   There were no vitals filed for this visit.  Examination:  General exam: Appears confused, chronically ill Respiratory system: scattered rhonchi. Mucous around tr Cardiovascular system: S1 & S2 heard, RRR.   Gastrointestinal system: Abdomen is nondistended, soft and nontender. G tube with brown material around the side Central nervous system: confused, moving all 4 Extremities: decreased muscle mass Skin: erythema and scaling diffuse rash on chest Psychiatry: confused    Data Reviewed: I have personally reviewed following labs and imaging studies  CBC: Recent Labs  Lab 07/19/23 0830 07/21/23 1804 07/22/23 0410  WBC 7.3 11.5* 10.6*  NEUTROABS 6.4 10.5*  --  HGB 13.1 13.2 12.6*  HCT 38.8* 39.2 38.0*  MCV 91.3 92.5 93.4  PLT 193 204 180   Basic Metabolic Panel: Recent Labs  Lab 07/19/23 0830 07/21/23 1759 07/22/23 0410  NA 131* 140 141  K 4.2 4.4 3.7  CL 90* 97* 100  CO2 32 23 27  GLUCOSE 216* 163* 139*  BUN 22 49* 45*  CREATININE 0.63 0.76 0.68  CALCIUM  11.0* 11.7* 11.6*   GFR: Estimated Creatinine Clearance: 84.6  mL/min (by C-G formula based on SCr of 0.68 mg/dL). Liver Function Tests: Recent Labs  Lab 07/19/23 0830 07/21/23 1759  AST 12* 74*  ALT 15 28  ALKPHOS 91 93  BILITOT 0.7 1.7*  PROT 6.9 6.8  ALBUMIN 3.4* 3.6   Recent Labs  Lab 07/19/23 0830  LIPASE 19   No results for input(s): AMMONIA in the last 168 hours. Coagulation Profile: No results for input(s): INR, PROTIME in the last 168 hours. Cardiac Enzymes: Recent Labs  Lab 07/21/23 1908 07/22/23 0410  CKTOTAL 1,079* 541*   BNP (last 3 results) No results for input(s): PROBNP in the last 8760 hours. HbA1C: No results for input(s): HGBA1C in the last 72 hours. CBG: Recent Labs  Lab 07/19/23 0747 07/22/23 0033  GLUCAP 192* 127*   Lipid Profile: No results for input(s): CHOL, HDL, LDLCALC, TRIG, CHOLHDL, LDLDIRECT in the last 72 hours. Thyroid Function Tests: No results for input(s): TSH, T4TOTAL, FREET4, T3FREE, THYROIDAB in the last 72 hours. Anemia Panel: No results for input(s): VITAMINB12, FOLATE, FERRITIN, TIBC, IRON, RETICCTPCT in the last 72 hours. Urine analysis:    Component Value Date/Time   COLORURINE YELLOW 07/19/2023 0745   APPEARANCEUR CLEAR 07/19/2023 0745   LABSPEC 1.012 07/19/2023 0745   PHURINE 6.0 07/19/2023 0745   GLUCOSEU NEGATIVE 07/19/2023 0745   HGBUR SMALL (A) 07/19/2023 0745   BILIRUBINUR NEGATIVE 07/19/2023 0745   KETONESUR NEGATIVE 07/19/2023 0745   PROTEINUR 30 (A) 07/19/2023 0745   NITRITE NEGATIVE 07/19/2023 0745   LEUKOCYTESUR NEGATIVE 07/19/2023 0745   Sepsis Labs: @LABRCNTIP (procalcitonin:4,lacticidven:4)  ) Recent Results (from the past 240 hours)  Resp panel by RT-PCR (RSV, Flu A&B, Covid) Anterior Nasal Swab     Status: None   Collection Time: 07/21/23  7:08 PM   Specimen: Anterior Nasal Swab  Result Value Ref Range Status   SARS Coronavirus 2 by RT PCR NEGATIVE NEGATIVE Final    Comment: (NOTE) SARS-CoV-2 target nucleic  acids are NOT DETECTED.  The SARS-CoV-2 RNA is generally detectable in upper respiratory specimens during the acute phase of infection. The lowest concentration of SARS-CoV-2 viral copies this assay can detect is 138 copies/mL. A negative result does not preclude SARS-Cov-2 infection and should not be used as the sole basis for treatment or other patient management decisions. A negative result may occur with  improper specimen collection/handling, submission of specimen other than nasopharyngeal swab, presence of viral mutation(s) within the areas targeted by this assay, and inadequate number of viral copies(<138 copies/mL). A negative result must be combined with clinical observations, patient history, and epidemiological information. The expected result is Negative.  Fact Sheet for Patients:  bloggercourse.com  Fact Sheet for Healthcare Providers:  seriousbroker.it  This test is no t yet approved or cleared by the [place]  FDA and  has been authorized for detection and/or diagnosis of SARS-CoV-2 by FDA under an Emergency Use Authorization (EUA). This EUA will remain  in effect (meaning this test can be used) for the duration of the COVID-19 declaration  under Section 564(b)(1) of the Act, 21 U.S.C.section 360bbb-3(b)(1), unless the authorization is terminated  or revoked sooner.       Influenza A by PCR NEGATIVE NEGATIVE Final   Influenza B by PCR NEGATIVE NEGATIVE Final    Comment: (NOTE) The Xpert Xpress SARS-CoV-2/FLU/RSV plus assay is intended as an aid in the diagnosis of influenza from Nasopharyngeal swab specimens and should not be used as a sole basis for treatment. Nasal washings and aspirates are unacceptable for Xpert Xpress SARS-CoV-2/FLU/RSV testing.  Fact Sheet for Patients: bloggercourse.com  Fact Sheet for Healthcare Providers: seriousbroker.it  This  test is not yet approved or cleared by the [place]  FDA and has been authorized for detection and/or diagnosis of SARS-CoV-2 by FDA under an Emergency Use Authorization (EUA). This EUA will remain in effect (meaning this test can be used) for the duration of the COVID-19 declaration under Section 564(b)(1) of the Act, 21 U.S.C. section 360bbb-3(b)(1), unless the authorization is terminated or revoked.     Resp Syncytial Virus by PCR NEGATIVE NEGATIVE Final    Comment: (NOTE) Fact Sheet for Patients: bloggercourse.com  Fact Sheet for Healthcare Providers: seriousbroker.it  This test is not yet approved or cleared by the [place]  FDA and has been authorized for detection and/or diagnosis of SARS-CoV-2 by FDA under an Emergency Use Authorization (EUA). This EUA will remain in effect (meaning this test can be used) for the duration of the COVID-19 declaration under Section 564(b)(1) of the Act, 21 U.S.C. section 360bbb-3(b)(1), unless the authorization is terminated or revoked.  Performed at [facility], [address] [place], [place] [address]          Radiology Studies: DG Chest Portable 1 View Result Date: 07/21/2023 CLINICAL DATA:  Recent fall EXAM: PORTABLE CHEST 1 VIEW COMPARISON:  None FINDINGS: Cardiac shadow is within normal limits. Right chest wall port and tracheostomy tube are noted in satisfactory position. Lungs are clear bilaterally. Multiple skin folds are noted. No bony abnormality is seen. IMPRESSION: No active disease. Electronically Signed   By: Oneil Devonshire M.D.   On: 07/21/2023 20:48   CT Head Wo Contrast Result Date: 07/21/2023 CLINICAL DATA:  Head trauma, minor (Age >= 65y); Neck trauma (Age >= 65y). Unwitnessed fall. Altered mental status. History of tongue cancer. EXAM: CT HEAD WITHOUT CONTRAST CT CERVICAL SPINE WITHOUT CONTRAST TECHNIQUE: Multidetector CT imaging of the head and  cervical spine was performed following the standard protocol without intravenous contrast. Multiplanar CT image reconstructions of the cervical spine were also generated. RADIATION DOSE REDUCTION: This exam was performed according to the departmental dose-optimization program which includes automated exposure control, adjustment of the mA and/or kV according to patient size and/or use of iterative reconstruction technique. COMPARISON:  Head MRI 07/19/2023.  PET-04/06/2023. FINDINGS: CT HEAD FINDINGS Brain: There is no evidence of an acute infarct, intracranial hemorrhage, mass, midline shift, or extra-axial fluid collection. There is mild cerebral atrophy. 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 Vascular: Calcified atherosclerosis at the skull base. No hyperdense vessel. Skull: No acute fracture or suspicious osseous lesion. Sinuses/Orbits: Small mucous retention cyst in the left maxillary sinus. Clear mastoid air cells. Unremarkable orbits. Other: None. CT CERVICAL SPINE FINDINGS Alignment: Normal. Skull base and vertebrae: No acute fracture or suspicious osseous lesion. Soft tissues and spinal canal: No prevertebral fluid or swelling. No visible canal hematoma. Disc levels: Cervical spondylosis and facet arthrosis. Moderate spinal stenosis and severe left greater than right neural foraminal stenosis at C5-6. Moderate  left neural foraminal stenosis at C3-4 and C4-5. Upper chest: Emphysema. Other: Partially visualized tracheostomy. Partially visualized postsurgical changes in the left neck. 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 IMPRESSION: 1. No evidence of acute intracranial abnormality or acute cervical spine fracture. 2. Known cervical nodal metastatic disease. Electronically Signed   By: Dasie Hamburg M.D.   On: 07/21/2023 20:25   CT Cervical Spine Wo Contrast Result Date: 07/21/2023 CLINICAL DATA:  Head trauma, minor (Age >= 65y); Neck trauma (Age  >= 65y). Unwitnessed fall. Altered mental status. History of tongue cancer. EXAM: CT HEAD WITHOUT CONTRAST CT CERVICAL SPINE WITHOUT CONTRAST TECHNIQUE: Multidetector CT imaging of the head and cervical spine was performed following the standard protocol without intravenous contrast. Multiplanar CT image reconstructions of the cervical spine were also generated. RADIATION DOSE REDUCTION: This exam was performed according to the departmental dose-optimization program which includes automated exposure control, adjustment of the mA and/or kV according to patient size and/or use of iterative reconstruction technique. COMPARISON:  Head MRI 07/19/2023.  PET-04/06/2023. FINDINGS: CT HEAD FINDINGS Brain: There is no evidence of an acute infarct, intracranial hemorrhage, mass, midline shift, or extra-axial fluid collection. There is mild cerebral atrophy. 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 Vascular: Calcified atherosclerosis at the skull base. No hyperdense vessel. Skull: No acute fracture or suspicious osseous lesion. Sinuses/Orbits: Small mucous retention cyst in the left maxillary sinus. Clear mastoid air cells. Unremarkable orbits. Other: None. CT CERVICAL SPINE FINDINGS Alignment: Normal. Skull base and vertebrae: No acute fracture or suspicious osseous lesion. Soft tissues and spinal canal: No prevertebral fluid or swelling. No visible canal hematoma. Disc levels: Cervical spondylosis and facet arthrosis. Moderate spinal stenosis and severe left greater than right neural foraminal stenosis at C5-6. Moderate left neural foraminal stenosis at C3-4 and C4-5. Upper chest: Emphysema. Other: Partially visualized tracheostomy. Partially visualized postsurgical changes in the left neck. 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 IMPRESSION: 1. No evidence of acute intracranial abnormality or acute cervical spine fracture. 2. Known cervical nodal metastatic disease.  Electronically Signed   By: Dasie Hamburg M.D.   On: 07/21/2023 20:25        Scheduled Meds:  aspirin   81 mg Oral Daily   buPROPion   150 mg Oral Daily   enoxaparin  (LOVENOX ) injection  40 mg Subcutaneous Q24H   silver  sulfADIAZINE   1 Application Topical Daily   sucralfate   1 g Oral TID WC & HS   triamcinolone   1 Application Topical BID   Continuous Infusions:  sodium chloride  40 mL/hr at 07/22/23 0404     LOS: 1 day     Devaughn KATHEE Ban, MD [facility]   If 7PM-7AM, please contact night-coverage [email or web address] Password [facility] 07/22/2023, 8:53 AM

## 2023-07-22 NOTE — Progress Notes (Signed)
 Pt AMS since arriving to unit, MD notified and aware.. Vital signs stable on 2L of o2 via [place].  Tube feed started at 36ml/hr per order. Fluids infusing. Pt currently resting at this time, chest wall visualized rising. Pt on telemetry

## 2023-07-22 NOTE — ED Notes (Signed)
 This RN notified pharmacy of missing meds

## 2023-07-22 NOTE — Consult Note (Signed)
 Palliative Medicine [facility] Telephone:([phone]) [phone] [phone]) [phone]   Name: Shane Perry Date: 07/22/2023 MRN: [id number]  DOB: September 20, 1955  Patient Care Team: Shane Erminio CROME, MD as PCP - General (Family Medicine) Shane Evalene PARAS, MD as PCP - Cardiology (Cardiology) Shane Aran, MD as Consulting Physician (Radiation Oncology) Shane Annah BROCKS, MD as Consulting Physician (Oncology)    REASON FOR CONSULTATION: Shane Perry is a 68 y.o. 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  Patient status post 6 cycles of cisplatin  with concurrent radiation.  He was admitted to hospital on 07/21/2023 with altered mental status after being found down at home by family.  Labs consistent with rhabdo and hypercalcemia.  Palliative care consulted to address goals.  SOCIAL HISTORY:     reports that he has been smoking cigarettes. He has a 50 pack-year smoking history. He has been exposed to tobacco smoke. He has never used smokeless tobacco. He reports that he does not currently use alcohol after a past usage of about 1.0 standard drink of alcohol per week. He reports that he does not use drugs.  Patient is unmarried.  He has a son who lives nearby but recently had a stroke.  Patient has another son in [place] .  Patient has a brother who lives nearby and is available to help if needed.  Patient was a truck hospital doctor.   ADVANCE DIRECTIVES:  Does not have  CODE STATUS: Full code  PAST MEDICAL HISTORY: Past Medical History:  Diagnosis Date   Cancer (HCC)    Diabetes mellitus without complication (HCC)    Diabetic polyneuropathy associated with type 2 diabetes mellitus (HCC)    History of kidney stones    Hyperlipidemia    Hypertension    Kidney stones    Onychomycosis    Panlobular emphysema (HCC)     Squamous cell carcinoma, tongue border (HCC)    Tongue cancer (HCC)    Weight loss     PAST SURGICAL HISTORY:  Past Surgical History:  Procedure Laterality Date   COLONOSCOPY     COLONOSCOPY WITH PROPOFOL  N/A 04/26/2023   Procedure: COLONOSCOPY WITH PROPOFOL ;  Surgeon: Jinny Carmine, MD;  Location: [facility] ENDOSCOPY;  Service: Endoscopy;  Laterality: N/A;   CYST REMOVAL NECK     GASTROSTOMY N/A 05/10/2023   Procedure: INSERTION OF GASTROSTOMY TUBE, open;  Surgeon: Desiderio Schanz, MD;  Location: [facility] ORS;  Service: General;  Laterality: N/A;   IR IMAGING GUIDED PORT INSERTION  04/14/2023   left arm muscle removal for tongue     partial tongue removal  03/14/2023   PORTA CATH INSERTION Right    tongue cancer     TRACHEOSTOMY  03/14/2023    HEMATOLOGY/ONCOLOGY HISTORY:  Oncology History  Tongue cancer (HCC)  02/01/2023 Initial Diagnosis   Tongue cancer (HCC)   02/01/2023 Cancer Staging   Staging form: Oral Cavity, AJCC 8th Edition - Clinical stage from 02/01/2023: Stage II (cT2, cN0, cM0) - Signed by Shane Annah BROCKS, MD on 02/01/2023 Histologic grade (G): G2 Histologic grading system: 3 grade system   03/29/2023 Cancer Staging   Staging form: Oral Cavity, AJCC 8th Edition - Pathologic stage from 03/29/2023: Stage IVB (pT4a, pN3b, cM0) - Signed by Shane Annah BROCKS, MD on 03/29/2023   04/27/2023 -  Chemotherapy   Patient is on Treatment Plan : HEAD/NECK Cisplatin  (40)  q7d       ALLERGIES:  is allergic to statins, varenicline, and lisinopril.  MEDICATIONS:  Current Facility-Administered Medications  Medication Dose Route Frequency Provider Last Rate Last Admin   0.9 %  sodium chloride  infusion   Intravenous Continuous Kandis Devaughn Sayres, MD 150 mL/hr at 07/22/23 0908 Rate Change at 07/22/23 0908   acetaminophen  (TYLENOL ) tablet 650 mg  650 mg Oral Q6H PRN Mansy, Jan A, MD       Or   acetaminophen  (TYLENOL ) suppository 650 mg  650 mg Rectal Q6H PRN Mansy, Jan A, MD       buPROPion   (WELLBUTRIN  SR) 12 hr tablet 150 mg  150 mg Oral Daily Mansy, Jan A, MD   150 mg at 07/22/23 0947   enoxaparin  (LOVENOX ) injection 40 mg  40 mg Subcutaneous Q24H Mansy, Jan A, MD   40 mg at 07/22/23 0053   feeding supplement (OSMOLITE 1.2 CAL) liquid 1,000 mL  1,000 mL Per Tube Continuous Wouk, Noah Bedford, MD       insulin  aspart (novoLOG ) injection 0-5 Units  0-5 Units Subcutaneous QHS Wouk, Devaughn Sayres, MD       insulin  aspart (novoLOG ) injection 0-9 Units  0-9 Units Subcutaneous TID WC Kandis Devaughn Sayres, MD   2 Units at 07/22/23 1217   magnesium  hydroxide (MILK OF MAGNESIA) suspension 30 mL  30 mL Oral Daily PRN Mansy, Jan A, MD       ondansetron  (ZOFRAN ) tablet 4 mg  4 mg Oral Q6H PRN Mansy, Jan A, MD       Or   ondansetron  (ZOFRAN ) injection 4 mg  4 mg Intravenous Q6H PRN Mansy, Jan A, MD       oxyCODONE  (ROXICODONE ) 5 MG/5ML solution 5 mg  5 mg Oral Q6H PRN Mansy, Jan A, MD       silver  sulfADIAZINE  (SILVADENE ) 1 % cream 1 Application  1 Application Topical Daily Lenon Elsie HERO, RPH       sucralfate  (CARAFATE ) tablet 1 g  1 g Oral TID WC & HS Mansy, Jan A, MD   1 g at 07/22/23 9052   traZODone  (DESYREL ) tablet 25 mg  25 mg Oral QHS PRN Mansy, Jan A, MD       triamcinolone  (KENALOG ) 0.025 % cream 1 Application  1 Application Topical BID PRN Lenon Elsie HERO, [facility]       zinc  oxide 20 % ointment   Topical Q0200 Wouk, Noah Bedford, MD   Given at 07/22/23 1235   Current Outpatient Medications  Medication Sig Dispense Refill   acetaminophen  (TYLENOL ) 160 MG/5ML solution Take 10 mLs (320 mg total) by mouth every 6 (six) hours as needed for moderate pain (pain score 4-6). 473 mL 1   aspirin  81 MG chewable tablet Chew 81 mg by mouth daily.     Blood Glucose Monitoring Suppl (ONETOUCH VERIO FLEX SYSTEM) w/Device KIT by Does not apply route.     buPROPion  (WELLBUTRIN ) 75 MG tablet Take 75 mg by mouth 3 (three) times daily.     dexamethasone  (DECADRON ) 4 MG tablet Take 4 mg by mouth daily.  TAKE 2 TABLETS BY MOUTH ONCE DAILY FOR THREE DAYS STARTING THE DAY AFTER CISPLATIN  CHEMOTHERAPY (TAKE WITH FOOD)     glucose blood test strip      insulin  lispro (HUMALOG) 100 UNIT/ML KwikPen INJECT 0-10 UNITS SUBCUTANEOUSLY WITH MEALS FOLLOWING SLIDING SCALE     lidocaine -prilocaine  (EMLA ) cream Apply to affected area once 30 g 3   magic  mouthwash (multi-ingredient) oral suspension Swish and swallow 1 to 2 teaspoonsfuls 4 times a day 480 mL 3   ondansetron  (ZOFRAN ) 8 MG tablet Take 1 tablet (8 mg total) by mouth every 8 (eight) hours as needed for nausea or vomiting. Start on the third day after cisplatin . 30 tablet 1   oxyCODONE  (ROXICODONE ) 5 MG/5ML solution Take 5 mLs (5 mg total) by mouth every 6 (six) hours as needed for severe pain (pain score 7-10). 140 mL 0   prochlorperazine  (COMPAZINE ) 10 MG tablet Take 1 tablet (10 mg total) by mouth every 6 (six) hours as needed (Nausea or vomiting). 30 tablet 1   silver  sulfADIAZINE  (SILVADENE ) 1 % cream Apply 1 Application topically daily. 50 g 0   sucralfate  (CARAFATE ) 1 g tablet Take 1 tablet (1 g total) by mouth 4 (four) times daily -  with meals and at bedtime. 120 tablet 3   triamcinolone  (KENALOG ) 0.025 % ointment Apply 1 Application topically 2 (two) times daily. 30 g 0   buPROPion  (WELLBUTRIN  SR) 150 MG 12 hr tablet Take 150 mg by mouth daily. (Patient not taking: Reported on 07/21/2023)     cephALEXin  (KEFLEX ) 500 MG capsule Take 1 capsule (500 mg total) by mouth 2 (two) times daily. (Patient not taking: Reported on 07/21/2023) 20 capsule 0   Nutritional Supplements (FEEDING SUPPLEMENT, OSMOLITE 1.5 CAL,) LIQD Give 1.5 cartons 4 times a day via feeding tube.  Flush with 60ml of water  before and after each feeding. Provide additional 3 cups ( ) via feeding tube or orally to meet hydration needs.   Needs bolus supplies for tube feeding.      VITAL SIGNS: BP 135/81   Pulse 97   Temp 98.9 F (37.2 C) (Axillary)   Resp 16   SpO2 90%  There  were no vitals filed for this visit.  Estimated body mass index is 19.7 kg/m as calculated from the following:   Height as of 07/19/23: 6' 1 (1.854 m).   Weight as of 07/19/23: 149 lb 4.8 oz (67.7 kg).  LABS: CBC:    Component Value Date/Time   WBC 10.6 (H) 07/22/2023 0410   HGB 12.6 (L) 07/22/2023 0410   HGB 13.0 06/22/2023 0850   HCT 38.0 (L) 07/22/2023 0410   PLT 180 07/22/2023 0410   PLT 286 06/22/2023 0850   MCV 93.4 07/22/2023 0410   NEUTROABS 10.5 (H) 07/21/2023 1804   LYMPHSABS 0.2 (L) 07/21/2023 1804   MONOABS 0.8 07/21/2023 1804   EOSABS 0.0 07/21/2023 1804   BASOSABS 0.0 07/21/2023 1804   Comprehensive Metabolic Panel:    Component Value Date/Time   NA 141 07/22/2023 0410   K 3.7 07/22/2023 0410   CL 100 07/22/2023 0410   CO2 27 07/22/2023 0410   BUN 45 (H) 07/22/2023 0410   CREATININE 0.68 07/22/2023 0410   CREATININE 0.66 06/29/2023 0832   GLUCOSE 139 (H) 07/22/2023 0410   CALCIUM  11.6 (H) 07/22/2023 0410   AST 74 (H) 07/21/2023 1759   ALT 28 07/21/2023 1759   ALKPHOS 93 07/21/2023 1759   BILITOT 1.7 (H) 07/21/2023 1759   PROT 6.8 07/21/2023 1759   ALBUMIN 3.6 07/21/2023 1759    RADIOGRAPHIC STUDIES: DG Chest Portable 1 View Result Date: 07/21/2023 CLINICAL DATA:  Recent fall EXAM: PORTABLE CHEST 1 VIEW COMPARISON:  None FINDINGS: Cardiac shadow is within normal limits. Right chest wall port and tracheostomy tube are noted in satisfactory position. Lungs are clear bilaterally. Multiple skin folds are noted. No bony  abnormality is seen. IMPRESSION: No active disease. Electronically Signed   By: Oneil Devonshire M.D.   On: 07/21/2023 20:48   CT Head Wo Contrast Result Date: 07/21/2023 CLINICAL DATA:  Head trauma, minor (Age >= 65y); Neck trauma (Age >= 65y). Unwitnessed fall. Altered mental status. History of tongue cancer. EXAM: CT HEAD WITHOUT CONTRAST CT CERVICAL SPINE WITHOUT CONTRAST TECHNIQUE: Multidetector CT imaging of the head and cervical spine was  performed following the standard protocol without intravenous contrast. Multiplanar CT image reconstructions of the cervical spine were also generated. RADIATION DOSE REDUCTION: This exam was performed according to the departmental dose-optimization program which includes automated exposure control, adjustment of the mA and/or kV according to patient size and/or use of iterative reconstruction technique. COMPARISON:  Head MRI 07/19/2023.  PET-04/06/2023. FINDINGS: CT HEAD FINDINGS Brain: There is no evidence of an acute infarct, intracranial hemorrhage, mass, midline shift, or extra-axial fluid collection. There is mild cerebral atrophy. 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 Vascular: Calcified atherosclerosis at the skull base. No hyperdense vessel. Skull: No acute fracture or suspicious osseous lesion. Sinuses/Orbits: Small mucous retention cyst in the left maxillary sinus. Clear mastoid air cells. Unremarkable orbits. Other: None. CT CERVICAL SPINE FINDINGS Alignment: Normal. Skull base and vertebrae: No acute fracture or suspicious osseous lesion. Soft tissues and spinal canal: No prevertebral fluid or swelling. No visible canal hematoma. Disc levels: Cervical spondylosis and facet arthrosis. Moderate spinal stenosis and severe left greater than right neural foraminal stenosis at C5-6. Moderate left neural foraminal stenosis at C3-4 and C4-5. Upper chest: Emphysema. Other: Partially visualized tracheostomy. Partially visualized postsurgical changes in the left neck. 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 IMPRESSION: 1. No evidence of acute intracranial abnormality or acute cervical spine fracture. 2. Known cervical nodal metastatic disease. Electronically Signed   By: Dasie Hamburg M.D.   On: 07/21/2023 20:25   CT Cervical Spine Wo Contrast Result Date: 07/21/2023 CLINICAL DATA:  Head trauma, minor (Age >= 65y); Neck trauma (Age >= 65y). Unwitnessed  fall. Altered mental status. History of tongue cancer. EXAM: CT HEAD WITHOUT CONTRAST CT CERVICAL SPINE WITHOUT CONTRAST TECHNIQUE: Multidetector CT imaging of the head and cervical spine was performed following the standard protocol without intravenous contrast. Multiplanar CT image reconstructions of the cervical spine were also generated. RADIATION DOSE REDUCTION: This exam was performed according to the departmental dose-optimization program which includes automated exposure control, adjustment of the mA and/or kV according to patient size and/or use of iterative reconstruction technique. COMPARISON:  Head MRI 07/19/2023.  PET-04/06/2023. FINDINGS: CT HEAD FINDINGS Brain: There is no evidence of an acute infarct, intracranial hemorrhage, mass, midline shift, or extra-axial fluid collection. There is mild cerebral atrophy. 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 Vascular: Calcified atherosclerosis at the skull base. No hyperdense vessel. Skull: No acute fracture or suspicious osseous lesion. Sinuses/Orbits: Small mucous retention cyst in the left maxillary sinus. Clear mastoid air cells. Unremarkable orbits. Other: None. CT CERVICAL SPINE FINDINGS Alignment: Normal. Skull base and vertebrae: No acute fracture or suspicious osseous lesion. Soft tissues and spinal canal: No prevertebral fluid or swelling. No visible canal hematoma. Disc levels: Cervical spondylosis and facet arthrosis. Moderate spinal stenosis and severe left greater than right neural foraminal stenosis at C5-6. Moderate left neural foraminal stenosis at C3-4 and C4-5. Upper chest: Emphysema. Other: Partially visualized tracheostomy. Partially visualized postsurgical changes in the left neck. Known cervical nodal metastatic disease with necrotic right level II  and III nodes. IMPRESSION: 1. No evidence of acute intracranial abnormality or acute cervical spine fracture. 2. Known cervical nodal metastatic disease. Electronically Signed    By: Dasie Hamburg M.D.   On: 07/21/2023 20:25   MR BRAIN WO CONTRAST Result Date: 07/19/2023 CLINICAL DATA:  Provided history: Stroke, follow-up. EXAM: MRI HEAD WITHOUT CONTRAST TECHNIQUE: Multiplanar, multiecho pulse sequences of the brain and surrounding structures were obtained without intravenous contrast. COMPARISON:  Head CT 07/19/2023. FINDINGS: Brain: Mild generalized cerebral atrophy. Multifocal T2 FLAIR hyperintense signal abnormality within the cerebral white matter, nonspecific but compatible with mild chronic small vessel ischemic disease. Punctate chronic microhemorrhage within the inferior left parietal lobe. 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 Two small chronic infarcts within the left cerebellar hemisphere. There is no acute infarct. No evidence of an intracranial mass. No extra-axial fluid collection. No midline shift. Vascular: Maintained flow voids within the proximal large arterial vessels. Skull and upper cervical spine: No focal worrisome marrow lesion. Sinuses/Orbits: No mass or acute finding within the imaged orbits. Small mucous retention cyst within the left maxillary sinus. IMPRESSION: 1.  No evidence of an acute intracranial abnormality. 2. 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 3. Chronic microhemorrhage within the left parietal lobe. 4. Small left maxillary sinus mucous retention cyst.  Yes Electronically Signed   By: Rockey Childs D.O.   On: 07/19/2023 11:26   CT Head Wo Contrast Result Date: 07/19/2023 CLINICAL DATA:  Altered mental status with unknown cause. EXAM: CT HEAD WITHOUT CONTRAST TECHNIQUE: Contiguous axial images were obtained from the base of the skull through the vertex without intravenous contrast. RADIATION DOSE REDUCTION: This exam was performed according to the departmental dose-optimization program which includes automated exposure control, adjustment of the mA and/or kV according to  patient size and/or use of iterative reconstruction technique. COMPARISON:  None Available. FINDINGS: Brain: No evidence of acute infarction, hemorrhage, hydrocephalus, extra-axial collection or mass lesion/mass effect. Small, discrete and chronic appearing lacune is in the left cerebellum. Mild generalized cerebral volume loss. Vascular: No hyperdense vessel or unexpected calcification. Skull: Normal. Negative for fracture or focal lesion. Sinuses/Orbits: No acute finding IMPRESSION: No acute or reversible finding. Electronically Signed   By: Dorn Roulette M.D.   On: 07/19/2023 08:16    PERFORMANCE STATUS (ECOG) : 1 - Symptomatic but completely ambulatory  Review of Systems Unless otherwise noted, a complete review of systems is negative.  Physical Exam General: NAD HEENT: Trach Pulmonary: Unlabored Abdomen: soft, nontender, PEG GU: no suprapubic tenderness Extremities: no edema, no joint deformities Skin: no rashes Neurological: Weakness, confusion  IMPRESSION: Patient seen in the emergency department.  Currently confused.  Unable to participate meaningfully in discussion regarding goals of treatment.  Workup consistent with rhabdo.  Confusion likely secondary to hypercalcemia.  Patient has Zometa  ordered by hospitalist team.  I called and spoke with patient's brother, who is his emergency contact.  Per brother, patient had episode of acute confusion earlier this week leading to patient getting lost in his car.  Patient was evaluated in the ED and returned home.  Was then found down at home by family.  Brother verbalizes agreement with current scope of treatment.  They have never discussed CODE STATUS but brother feels that patient would want to remain a full code for now.  In the event of decline or if it became evident that chances for meaningful recovery were poor, brother says that they would revisit decision making.  Patient's brother would like to pursue rehab.  Patient would  benefit from palliative care following in that setting.  Of note, patient had communicated desire for his brother to be healthcare proxy but had not completed advanced directives. Patient has two sons, one of whom recently had a stoke and is still hospitalized and the other who lives in [place] .  PLAN: -Continue current scope of treatment -Full code -Dispo: Probable rehab with palliative care following    Time Total: 45 minutes  Visit consisted of counseling and education dealing with the complex and emotionally intense issues of symptom management and palliative care in the setting of serious and potentially life-threatening illness.Greater than 50%  of this time was spent counseling and coordinating care related to the above assessment and plan.  Signed by: Fonda Mower, PhD, NP-C

## 2023-07-22 NOTE — ED Notes (Signed)
 This nurse provided oral care for this pt. Split gauze was applied around tube site. Pt tolerated well. NAD

## 2023-07-22 NOTE — ED Notes (Signed)
Pt repositioned by this RN  

## 2023-07-22 NOTE — ED Notes (Signed)
 Pt repositioned and new brief applied. Peri care conducted by this nurse. Pt tolerated well. Bed alarm on. NAD

## 2023-07-22 NOTE — Progress Notes (Signed)
 Care RN, and floor RN in room to get pt's ordered EKG pt started having seizure like activity that started with eye blinking and then full body jerking and movement . Seizure like activity started at 2034 and lasted til 2036. Pt head protected and turned to the side suction available but not needed. Rapid Response called, charge RN and provider at bedside. See new orders.   07/22/23 2045  Vitals  BP 138/76  MAP (mmHg) 90  BP Location Left Arm  BP Method Automatic  Patient Position (if appropriate) Lying  Pulse Rate (!) 140  Pulse Rate Source Monitor  Resp 19  MEWS COLOR  MEWS Score Color Red  Oxygen Therapy  SpO2 100 %  O2 Device Non-rebreather Mask  MEWS Score  MEWS Temp 0  MEWS Systolic 0  MEWS Pulse 3  MEWS RR 0  MEWS LOC 3  MEWS Score 6

## 2023-07-22 NOTE — Assessment & Plan Note (Signed)
-   The patient will be placed on supplemental coverage with NovoLog. 

## 2023-07-22 NOTE — ED Notes (Signed)
 Patient is alert, has garbled speech, but is cooperative with requests. Patient is incontinent of urine in a brief which was changed.

## 2023-07-22 NOTE — Procedures (Signed)
 Modified Barium Swallow Study  Patient Details  Name: [name] MRN: [id number] Date of Birth: 1955-11-13  Today's Date: 07/22/2023  Modified Barium Swallow completed.  Full report located under Chart Review in the Imaging Section.  History of Present Illness Shane Perry is a 68 y.o. African-American male with medical history significant for type diabetes mellitus, peripheral neuropathy, hypertension, dyslipidemia, urolithiasis and lung cancer status postchemotherapy and radiotherapy as well as glossectomy, and modified neck dissection on 03/14/2023 with tracheostomy and PEG tube placement who cleared show, who presented to the emergency room with acute onset of altered mental status with confusion and disorientation on 07/21/2023. Of note, pt was taken to ED at [facility] by [facility] on 07/19/2023  as pt was found driving on the wrong side [place] [address]. MRI revealed 07/19/2023 Mild generalized cerebral atrophy. Multifocal T2 FLAIR hyperintense signal abnormality within the cerebral white matter, nonspecific but compatible with mild chronic small vessel ischemic disease. Punctate chronic microhemorrhage within the inferior left parietal lobe. 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 Two small chronic infarcts within the left cerebellar hemisphere. There is no acute infarct. No evidence of an intracranial mass. No extra-axial fluid collection. No midline shift.   Clinical Impression Pt presented to fluro room with incessant talking and continued decreased speech intelligibility (<25%). This clinical research associate made attempts to redirect pt to yes/no questions and to redirect pt to instructions regarding swallow study. Pt was agreeable to study and was transferred by staff into radiology chair. He continued talking and pointing to his knee and different fingers. This writer was not able to assist pt with communication.     Pt was agreeable to consuming nectar thick liquid via spoon. While holding  this bolus in the anterior portion of his mouth, he continued talking for > 10 minutes with no production of swallow response. After extended period of time, pt was able to be redirected to consuming thin liquids via spoon. After 1 spoonful size bolus pt stated need another one. Second tsp size thin liquid bolus was administered with bolus falling over back of tongue and silent aspiration observed. Nectar thick liquid bolus had to be removed manually by this clinical research associate with washcloth. At this time, pt is at increased risk of aspiration and would benefit from NPO status with use of PEG for nutrition. Should pt wish to return to PO intake, would recommend follow up Outpatient Modified Barium Swallow Study.  Factors that may increase risk of adverse event in presence of aspiration Noe & Lianne 2021): Poor general health and/or compromised immunity;Respiratory or GI disease;Reduced cognitive function;Inadequate oral hygiene;Reduced saliva;Aspiration of thick, dense, and/or acidic materials;Frequent aspiration of large volumes;Presence of tubes (ETT, trach, NG, etc.)  Swallow Evaluation Recommendations Recommendations: NPO;Alternative means of nutrition - G Tube Medication Administration: Via alternative means Oral care recommendations: Oral care QID (4x/day)    Lyn Deemer B. Rubbie, M.S., CCC-SLP, Tree Surgeon Certified Brain Injury Specialist [facility]  [facility] Office [phone] Ascom [phone] Fax [phone]

## 2023-07-22 NOTE — Assessment & Plan Note (Signed)
-   We will continue albuterol XL.

## 2023-07-22 NOTE — Assessment & Plan Note (Signed)
-  We will continue Carafate.

## 2023-07-22 NOTE — Assessment & Plan Note (Signed)
-   This likely secondary to volume depletion and dehydration. - The patient be hydrated as mentioned above we will follow calcium level.

## 2023-07-22 NOTE — Progress Notes (Signed)
 Responded to rapid response at 2034, at bedside patient was unresponsive cyanotic, with a heart rate in the 130's. Lopressor given to bring heart rate down and then assisted with transferring patient to ICU

## 2023-07-22 NOTE — ED Notes (Signed)
 TV shut off, room darkened for comfort. Bed alarm on, door is opened for safety.

## 2023-07-22 NOTE — Consult Note (Signed)
 WOC Nurse Consult Note: Reason for Consult: leaking Gtube Patient from home with AMS, history of DM/HTN  neck surgery 03/14/23 with trach and PEG tube.  Wound type: ICD; irritant contact dermatitis ICD-10 CM Codes for Irritant Dermatitis L24B1 - Related to digestive stoma or fistula Pressure Injury POA: NA Measurement:NA Wound bed: see nursing flow sheets Drainage (amount, consistency, odor) see nursing flow sheets Periwound: see nursing flow sheets Dressing procedure/placement/frequency: Zinc  based barrier to affected skin daily at Gtube site  Cut silicone foam like drain sponge and place around tube for excessive drainage.   Change daily.    Re consult if needed, will not follow at this time. Thanks  Zayah [employer]ilman [employer], RN,CWOCN, CNS, CWON-AP [phone])

## 2023-07-22 NOTE — ED Notes (Signed)
 New brief applied by this RN and chuck pad.  2 L [place] applied due to pt sat in upper 80's, probe was also changed. Bandage applied to sacrum area due to redness. Pt tolerated well. CB within reach.

## 2023-07-22 NOTE — TOC Initial Note (Signed)
 Transition of Care [facility]) - Initial/Assessment Note    Patient Details  Name: Shane Perry MRN: [id number] Date of Birth: December 18, 1955  Transition of Care [facility]) CM/SW Contact:    Su Duma E Jorge Retz, LCSW Phone Number: 07/22/2023, 10:23 AM  Clinical Narrative:                 CSW received a call from patient's brother Elza. Patient is from home alone. Herbert lives across the road and can provide support at times. Patient drives at baseline. Elza unsure of PCP. Pharmacy is [facility]. No HH or SNF history. No DME other than a suction machine. Elza states patient just completed chemo and radiation a few weeks ago.  Elza states he feels patient need short term rehab before returning home and would like patient to go to [facility] if possible. CSW explained SNF process and that PT evals are pending. Herbert verbalized understanding. TOC to follow up after PT has evaluated the patient.  Expected Discharge Plan: Skilled Nursing Facility Barriers to Discharge: Continued Medical Work up   Patient Goals and CMS Choice Patient states their goals for this hospitalization and ongoing recovery are:: family wants SNF CMS Medicare.gov Compare Post Acute Care list provided to:: Patient Choice offered to / list presented to : Patient Jem[place] ownership interest in [facility].provided to:: Patient    Expected Discharge Plan and Services       Living arrangements for the past 2 months: Single Family Home                                      Prior Living Arrangements/Services Living arrangements for the past 2 months: Single Family Home Lives with:: Self Patient language and need for interpreter reviewed:: Yes        Need for Family Participation in Patient Care: Yes (Comment) Care giver support system in place?: Yes (comment)   Criminal Activity/Legal Involvement Pertinent to Current Situation/Hospitalization: No - Comment as needed  Activities  of Daily Living      Permission Sought/Granted                  Emotional Assessment         Alcohol / Substance Use: Not Applicable Psych Involvement: No (comment)  Admission diagnosis:  Rhabdomyolysis [M62.82] Patient Active Problem List   Diagnosis Date Noted   Hyponatremia 07/22/2023   Hypercalcemia 07/22/2023   Depression 07/22/2023   Type 2 diabetes mellitus with peripheral neuropathy (HCC) 07/22/2023   GERD without esophagitis 07/22/2023   Feeding by G-tube (HCC) 07/22/2023   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   Rhabdomyolysis 07/21/2023   Encounter for screening colonoscopy 04/26/2023   Oropharyngeal dysphagia 04/13/2023   Malignant neoplasm of tongue, unspecified (HCC) 02/09/2023   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   Diabetic polyneuropathy associated with type 2 diabetes mellitus (HCC) 10/14/2022   Seborrheic dermatitis 05/11/2021   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   Onychomycosis 05/14/2019   Hyperlipidemia associated with type 2 diabetes mellitus (HCC) 05/05/2018   Hypertension associated with diabetes (HCC) 05/05/2018   Uncontrolled type 2 diabetes mellitus with hyperglycemia (HCC) 05/05/2018   Cigarette nicotine dependence with nicotine-induced disorder 05/15/2015   Renal stone 07/22/2014   PCP:  Gladystine Erminio CROME, MD Pharmacy:   [facility] [address], [place] - [address] [place] [address] Phone: [phone] Fax: [phone]  Social Drivers of Health (SDOH) Social History: SDOH Screenings   Food Insecurity: Medium Risk (03/21/2023)   Received from [facility]  Housing: Low Risk  (03/21/2023)   Received from [facility]  Transportation Needs: No Transportation Needs (03/21/2023)   Received from [facility]  Utilities: Low Risk  (03/21/2023)   Received from [facility]  Recent Concern: Utilities - At Risk (02/18/2023)   Received from [facility]  Depression (PHQ2-9): Low Risk  (02/01/2023)   Financial Resource Strain: Patient Declined (02/18/2023)   Received from [facility]  Physical Activity: Insufficiently Active (02/18/2023)   Received from [facility]  Social Connections: Moderately Integrated (02/18/2023)   Received from [facility]  Stress: No Stress Concern Present (02/18/2023)   Received from [facility]  Tobacco Use: High Risk (07/21/2023)   SDOH Interventions:     Readmission Risk Interventions     No data to display

## 2023-07-22 NOTE — Assessment & Plan Note (Signed)
-   We will continue hydration with IV normal saline as mentioned above and follow sodium level.

## 2023-07-22 NOTE — NC FL2 (Signed)
 [place]  MEDICAID FL2 LEVEL OF CARE FORM     IDENTIFICATION  Patient Name: Shane Perry Birthdate: [birth year] Sex: male Admission Date (Current Location): 07/21/2023  [facility] and [place] Number:  Chiropodist and Address:  [facility], [address], [place], [place] [address]      Provider Number: [id number]  Attending Physician Name and Address:  Kandis Devaughn Sayres, MD  Relative Name and Phone Number:  LENNEX, PIETILA [facility])  [phone] [facility])    Current Level of Care: Hospital Recommended Level of Care: Skilled Nursing Facility Prior Approval Number:    Date Approved/Denied:   PASRR Number: [id number]  Discharge Plan:      Current Diagnoses: Patient Active Problem List   Diagnosis Date Noted   Hyponatremia 07/22/2023   Hypercalcemia 07/22/2023   Depression 07/22/2023   Type 2 diabetes mellitus with peripheral neuropathy (HCC) 07/22/2023   GERD without esophagitis 07/22/2023   Feeding by G-tube (HCC) 07/22/2023   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   Palliative care encounter 07/22/2023   Rhabdomyolysis 07/21/2023   Encounter for screening colonoscopy 04/26/2023   Oropharyngeal dysphagia 04/13/2023   Malignant neoplasm of tongue, unspecified (HCC) 02/09/2023   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   Diabetic polyneuropathy associated with type 2 diabetes mellitus (HCC) 10/14/2022   Seborrheic dermatitis 05/11/2021   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   Onychomycosis 05/14/2019   Hyperlipidemia associated with type 2 diabetes mellitus (HCC) 05/05/2018   Hypertension associated with diabetes (HCC) 05/05/2018   Uncontrolled type 2 diabetes mellitus with hyperglycemia (HCC) 05/05/2018   Cigarette nicotine dependence with nicotine-induced disorder 05/15/2015   Renal stone 07/22/2014    Orientation RESPIRATION BLADDER Height & Weight        Other (Comment) (long term trach) Incontinent Weight:   Height:      BEHAVIORAL SYMPTOMS/MOOD NEUROLOGICAL BOWEL NUTRITION STATUS        Feeding tube  AMBULATORY STATUS COMMUNICATION OF NEEDS Skin     Verbally (difficult to understand)                         Personal Care Assistance Level of Assistance  Bathing, Dressing, Feeding           Functional Limitations Info             SPECIAL CARE FACTORS FREQUENCY  PT (By licensed PT), OT (By licensed OT), Speech therapy     PT Frequency: 5 times per week OT Frequency: 5 times per week     Speech Therapy Frequency: 2 times per week; Modified Barium Swallow as an outpatient      Contractures      Additional Factors Info  Code Status, Allergies Code Status Info: full Allergies Info: Statins, Varenicline, Lisinopril           Current Medications (07/22/2023):  This is the current hospital active medication list Current Facility-Administered Medications  Medication Dose Route Frequency Provider Last Rate Last Admin   0.9 %  sodium chloride  infusion   Intravenous Continuous Kandis Devaughn Sayres, MD 150 mL/hr at 07/22/23 0908 Rate Change at 07/22/23 0908   acetaminophen  (TYLENOL ) tablet 650 mg  650 mg Oral Q6H PRN Mansy, Jan A, MD       Or   acetaminophen  (TYLENOL ) suppository 650 mg  650 mg Rectal Q6H PRN Mansy, Jan A, MD       buPROPion  (WELLBUTRIN  SR) 12 hr tablet 150 mg  150 mg Oral Daily Mansy, Jan A,  MD   150 mg at 07/22/23 0947   enoxaparin  (LOVENOX ) injection 40 mg  40 mg Subcutaneous Q24H Mansy, Jan A, MD   40 mg at 07/22/23 0053   feeding supplement (OSMOLITE 1.2 CAL) liquid 1,000 mL  1,000 mL Per Tube Continuous Wouk, Noah Bedford, MD       insulin  aspart (novoLOG ) injection 0-5 Units  0-5 Units Subcutaneous QHS Wouk, Devaughn Sayres, MD       insulin  aspart (novoLOG ) injection 0-9 Units  0-9 Units Subcutaneous TID WC Kandis Devaughn Sayres, MD   2 Units at 07/22/23 1217   magnesium  hydroxide (MILK OF MAGNESIA) suspension 30 mL  30 mL Oral Daily PRN Mansy, Jan A, MD       ondansetron   (ZOFRAN ) tablet 4 mg  4 mg Oral Q6H PRN Mansy, Jan A, MD       Or   ondansetron  (ZOFRAN ) injection 4 mg  4 mg Intravenous Q6H PRN Mansy, Jan A, MD       oxyCODONE  (ROXICODONE ) 5 MG/5ML solution 5 mg  5 mg Oral Q6H PRN Mansy, Jan A, MD       silver  sulfADIAZINE  (SILVADENE ) 1 % cream 1 Application  1 Application Topical Daily Lenon Elsie HERO, RPH       sucralfate  (CARAFATE ) tablet 1 g  1 g Oral TID WC & HS Mansy, Jan A, MD   1 g at 07/22/23 9052   traZODone  (DESYREL ) tablet 25 mg  25 mg Oral QHS PRN Mansy, Jan A, MD       triamcinolone  (KENALOG ) 0.025 % cream 1 Application  1 Application Topical BID PRN Lenon Elsie HERO, [facility]       zinc  oxide 20 % ointment   Topical Q0200 Wouk, Noah Bedford, MD   Given at 07/22/23 1235   Current Outpatient Medications  Medication Sig Dispense Refill   acetaminophen  (TYLENOL ) 160 MG/5ML solution Take 10 mLs (320 mg total) by mouth every 6 (six) hours as needed for moderate pain (pain score 4-6). 473 mL 1   aspirin  81 MG chewable tablet Chew 81 mg by mouth daily.     Blood Glucose Monitoring Suppl (ONETOUCH VERIO FLEX SYSTEM) w/Device KIT by Does not apply route.     buPROPion  (WELLBUTRIN ) 75 MG tablet Take 75 mg by mouth 3 (three) times daily.     dexamethasone  (DECADRON ) 4 MG tablet Take 4 mg by mouth daily. TAKE 2 TABLETS BY MOUTH ONCE DAILY FOR THREE DAYS STARTING THE DAY AFTER CISPLATIN  CHEMOTHERAPY (TAKE WITH FOOD)     glucose blood test strip      insulin  lispro (HUMALOG) 100 UNIT/ML KwikPen INJECT 0-10 UNITS SUBCUTANEOUSLY WITH MEALS FOLLOWING SLIDING SCALE     lidocaine -prilocaine  (EMLA ) cream Apply to affected area once 30 g 3   magic mouthwash (multi-ingredient) oral suspension Swish and swallow 1 to 2 teaspoonsfuls 4 times a day 480 mL 3   ondansetron  (ZOFRAN ) 8 MG tablet Take 1 tablet (8 mg total) by mouth every 8 (eight) hours as needed for nausea or vomiting. Start on the third day after cisplatin . 30 tablet 1   oxyCODONE  (ROXICODONE ) 5 MG/5ML  solution Take 5 mLs (5 mg total) by mouth every 6 (six) hours as needed for severe pain (pain score 7-10). 140 mL 0   prochlorperazine  (COMPAZINE ) 10 MG tablet Take 1 tablet (10 mg total) by mouth every 6 (six) hours as needed (Nausea or vomiting). 30 tablet 1   silver  sulfADIAZINE  (SILVADENE ) 1 % cream Apply 1 Application  topically daily. 50 g 0   sucralfate  (CARAFATE ) 1 g tablet Take 1 tablet (1 g total) by mouth 4 (four) times daily -  with meals and at bedtime. 120 tablet 3   triamcinolone  (KENALOG ) 0.025 % ointment Apply 1 Application topically 2 (two) times daily. 30 g 0   buPROPion  (WELLBUTRIN  SR) 150 MG 12 hr tablet Take 150 mg by mouth daily. (Patient not taking: Reported on 07/21/2023)     cephALEXin  (KEFLEX ) 500 MG capsule Take 1 capsule (500 mg total) by mouth 2 (two) times daily. (Patient not taking: Reported on 07/21/2023) 20 capsule 0   Nutritional Supplements (FEEDING SUPPLEMENT, OSMOLITE 1.5 CAL,) LIQD Give 1.5 cartons 4 times a day via feeding tube.  Flush with 60ml of water  before and after each feeding. Provide additional 3 cups ( ) via feeding tube or orally to meet hydration needs.   Needs bolus supplies for tube feeding.       Discharge Medications: Please see discharge summary for a list of discharge medications.  Relevant Imaging Results:  Relevant Lab Results:   Additional Information SS #: [id number], long term trach and PEG tube, has completed his cancer treatments  Libra Gatz E Amariya Liskey, LCSW

## 2023-07-22 NOTE — Progress Notes (Signed)
 Initial Nutrition Assessment  DOCUMENTATION CODES:   Severe malnutrition in context of chronic illness  INTERVENTION:   -TF via g-tube:   Initiate Osmolite 1.5 @ 20 ml/hr and increase by 10 ml every 8 hours to goal rate of 60 ml/hr.   60 ml Prosource TF BID  30 ml free water  flush every 4 hours  Tube feeding regimen provides 2320 kcal (100% of needs), 130 grams of protein, and 1097 ml of H2O. Total free water : 1277 ml daily  -MVI with minerals daily via tube -100 mg thiamine  daily x 7 days via tube -Monitor Mg, K, and Phos and replete as needed seocndary to high refeeding risk  NUTRITION DIAGNOSIS:   Severe Malnutrition related to chronic illness, cancer and cancer related treatments (lung cancer s/p chemo, radiation and glossectomy/ modified neck dissection) as evidenced by percent weight loss, severe fat depletion, severe muscle depletion.  GOAL:   Patient will meet greater than or equal to 90% of their needs  MONITOR:   TF tolerance  REASON FOR ASSESSMENT:   Consult Assessment of nutrition requirement/status, Enteral/tube feeding initiation and management  ASSESSMENT:   Pt with medical history significant for type diabetes mellitus, peripheral neuropathy, hypertension, dyslipidemia, urolithiasis and lung cancer status postchemotherapy and radiotherapy as well as glossectomy, and modified neck dissection on 03/14/2023 with tracheostomy and PEG tube placement who cleared show, who presented with acute onset of altered mental status with confusion and disorientation.  Pt admitted for rhabdomyolysis secondary to fall.   10/22- s/p open g-tube 1/3- s/p MBSS- NPO  Reviewed I/O's: +1 L x 24 hours  Case discussed with SLP; recommending NPO with TF.   Pt sitting up in bed at time of visit. He was lethargic and did not respond to voice or touch. No family at bedside to provide additional history.   Pt is followed by RD at [facility]. As of 07/06/23, pt was consuming  liquids by mouth as well as some applesauce and yogurt. He has difficulty eating due to dysphagia and food having no taste. He was using PEG- 6-8 cartons of either Osmolite 1.5 or Equate Plus daily.   RD unsure pt last ate or received TF. Concern for refeeding risk secondary to malnutrition and weight loss.   Reviewed wt hx; pt has experienced a 5% wt loss over the past month, which is significant for time frame.   Case discussed with RN.   Palliative care following; pt desires aggressive care at this time.   Medications reviewed and include lovenox , carafate , and 0.9% sodium chloride  infusion @ 150 ml/hr.   No results found for: HGBA1C PTA DM medications are 3 units insulin  regular TID .   Labs reviewed: K, Mg, and Phos WDL. CBGS: 127-156 (inpatient orders for glycemic control are 0-5 units insulin  aspart daily at bedtime and 0-9 units insulin  aspart TID with meals).    NUTRITION - FOCUSED PHYSICAL EXAM:  Flowsheet Row Most Recent Value  Orbital Region Severe depletion  Upper Arm Region Severe depletion  Thoracic and Lumbar Region Severe depletion  Buccal Region Severe depletion  Temple Region Severe depletion  Clavicle Bone Region Severe depletion  Clavicle and Acromion Bone Region Severe depletion  Scapular Bone Region Severe depletion  Dorsal Hand Severe depletion  Patellar Region Severe depletion  Anterior Thigh Region Severe depletion  Posterior Calf Region Severe depletion  Edema (RD Assessment) None  Hair Reviewed  Eyes Reviewed  Mouth Reviewed  Skin Reviewed  Nails Reviewed  Diet Order:   Diet Order             Diet NPO time specified  Diet effective now                   EDUCATION NEEDS:   No education needs have been identified at this time  Skin:  Skin Assessment: Reviewed RN Assessment  Last BM:  Unknown  Height:   Ht Readings from Last 1 Encounters:  07/19/23 6' 1 (1.854 m)    Weight:   Wt Readings from Last 1 Encounters:   07/19/23 67.7 kg    Ideal Body Weight:  83.6 kg  BMI:  There is no height or weight on file to calculate BMI.  Estimated Nutritional Needs:   Kcal:  2150-2350  Protein:  120-135 grams  Fluid:  > 2 L    Margery ORN, RD, LDN, CDCES Registered Dietitian III Certified Diabetes Care and Education Specialist If unable to reach this RD, please use RD Inpatient group chat on secure chat between hours of 8am-4 pm daily

## 2023-07-22 NOTE — ED Notes (Signed)
 This nurse applied 2 warm blankets to pt and non slip  socks. Pt appeared anxious and tried to talk but it was garbled. After applying blankets pt nodded head. Pt is now asleep, equal rise and fall of chest noted. NAD, CB within reach.

## 2023-07-22 NOTE — ED Notes (Signed)
 This nurse cleaned G tube area, applied Zinc and split gauze. Pt tolerated well but was trying to communicate something about his tube. New gown applied after it was soiled with gastric juices. CB within reach. PT at bedside

## 2023-07-22 NOTE — Progress Notes (Signed)
 Shane Perry A&O x 0 not following commands. No response to verbal stimuli or pain . Shane Perry skin color clammy and gray.  Per day time RN day time provider aware. HR 130s-140 sustained . Provider on call notified, see new orders.   07/22/23 1945  Vitals  Temp 98.2 F (36.8 C)  Temp Source Oral  BP (!) 178/99  MAP (mmHg) 120  BP Location Left Arm  BP Method Automatic  Patient Position (if appropriate) Lying  Pulse Rate (!) 128  Pulse Rate Source Monitor  ECG Heart Rate (!) 128  Resp 18  Level of Consciousness  Level of Consciousness Unresponsive  MEWS COLOR  MEWS Score Color Red  Oxygen Therapy  SpO2 92 %  MEWS Score  MEWS Temp 0  MEWS Systolic 0  MEWS Pulse 2  MEWS RR 0  MEWS LOC 3  MEWS Score 5

## 2023-07-22 NOTE — Evaluation (Signed)
 Clinical/Bedside Swallow Evaluation Patient Details  Name: Shane Perry MRN: [id number] Date of Birth: 1955/09/22  Today's Date: 07/22/2023 Time: SLP Start Time (ACUTE ONLY): 1115 SLP Stop Time (ACUTE ONLY): 1125 SLP Time Calculation (min) (ACUTE ONLY): 10 min  Past Medical History:  Past Medical History:  Diagnosis Date   Cancer (HCC)    Diabetes mellitus without complication (HCC)    Diabetic polyneuropathy associated with type 2 diabetes mellitus (HCC)    History of kidney stones    Hyperlipidemia    Hypertension    Kidney stones    Onychomycosis    Panlobular emphysema (HCC)    Squamous cell carcinoma, tongue border (HCC)    Tongue cancer (HCC)    Weight loss    Past Surgical History:  Past Surgical History:  Procedure Laterality Date   COLONOSCOPY     COLONOSCOPY WITH PROPOFOL  N/A 04/26/2023   Procedure: COLONOSCOPY WITH PROPOFOL ;  Surgeon: Jinny Carmine, MD;  Location: [facility] ENDOSCOPY;  Service: Endoscopy;  Laterality: N/A;   CYST REMOVAL NECK     GASTROSTOMY N/A 05/10/2023   Procedure: INSERTION OF GASTROSTOMY TUBE, open;  Surgeon: Desiderio Schanz, MD;  Location: [facility] ORS;  Service: General;  Laterality: N/A;   IR IMAGING GUIDED PORT INSERTION  04/14/2023   left arm muscle removal for tongue     partial tongue removal  03/14/2023   PORTA CATH INSERTION Right    tongue cancer     TRACHEOSTOMY  03/14/2023   HPI:  Shane Perry is a 68 y.o. African-American male with medical history significant for type diabetes mellitus, peripheral neuropathy, hypertension, dyslipidemia, urolithiasis and lung cancer status postchemotherapy and radiotherapy as well as glossectomy, and modified neck dissection on 03/14/2023 with tracheostomy and PEG tube placement who cleared show, who presented to the emergency room with acute onset of altered mental status with confusion and disorientation on 07/21/2023. Of note, pt was taken to ED at [facility] by [facility] on 07/19/2023  as pt was found  driving [place] [address]. MRI revealed 07/19/2023 Mild generalized cerebral atrophy. Multifocal T2 FLAIR hyperintense signal abnormality within the cerebral white matter, nonspecific but compatible with mild chronic small vessel ischemic disease. Punctate chronic microhemorrhage within the inferior left parietal lobe. 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 Two small chronic infarcts within the left cerebellar hemisphere. There is no acute infarct. No evidence of an intracranial mass. No extra-axial fluid collection. No midline shift.    Assessment / Plan / Recommendation  Clinical Impression  Pt with multiple FEES while admited at [facility]. Last documented FEES was on 04/19/2023. Patient presents with stable oropharyngeal dysphagia. Of note, observed altered appearance of left vocal fold with irregular tissue on posterior portion (see stills below). Oral stage with pre/post-swallow spillage over base of tongue, residue, and increased need for liquid to moisten/transit dry solid. Pharyngeal stage with mild ranging to severe residue, penetration of thin/thick liquids and solid mixed with wash (frequently cord-level after swallow with thins and once with mildly-thick), and episode of aspiration with thins when left headturn not utilized. Strategies continue to improve airway protection/bolus propulsion. Patient was educated on risks and adverse outcomes associated with dysphagia/aspiration and reviewed the relative cost/benefits of management options: thin vs mildly-thick liquids. Patient interested in trialing thick liquids with unthickened water . Following discussion, patient to initiate a soft/moist solid diet with thick liquids and unthickened water  with adherence to aspiration precautions.  Pt completed chemotherapy and radiation at [facility].  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 It is an uncapped Shiley but I am unable to read  the size. Per chart it appears that pt's last visit with otolaryngology was 05/13/2023 at [facility]. Per chart his was consuming puree at that time. Open G-tube place at [facility] on 05/10/2023.   During this evaluation, pt was verbal with decreased speech intelligibility of ~ 25% at the word level d/t the above described glossectomy. Pt was given a pen and paper but he didn't attempt to write anything down (unsure of ability to read or write at this time). Pt appeared perseverative with pointing to his fingers and difficult to re-direct to this writer's questions. Per chart, it appears that pt was consuming some purees/soft solids prior to this admission. A regular breakfast tray was present with thin liquids. Pt was agreeable to consuming some of the thin liquids via cup.   At bedside, he presents with concerning s/s of aspiration when consuming thin liquids via cup. Pt with immediate coughing and anterior oral spillage.   At this time, would recommend an instrumental study to further assess pt's safety with PO intake.    SLP Visit Diagnosis: Dysphagia, oropharyngeal phase (R13.12)    Aspiration Risk  Severe aspiration risk    Diet Recommendation NPO    Medication Administration: Via alternative means    Other  Recommendations Oral Care Recommendations: Oral care QID    Recommendations for follow up therapy are one component of a multi-disciplinary discharge planning process, led by the attending physician.  Recommendations may be updated based on patient status, additional functional criteria and insurance authorization.  Follow up Recommendations Follow physician's recommendations for discharge plan and follow up therapies      Assistance Recommended at Discharge    Functional Status Assessment Patient has had a recent decline in their functional status and/or demonstrates limited ability to make significant improvements in function in a reasonable and predictable amount of time   Frequency and Duration min 2x/week  2 weeks       Prognosis Prognosis for improved oropharyngeal function: Guarded Barriers to Reach Goals: Severity of deficits;Time post onset (radical neck dissection and glossectomy)      Swallow Study   General Date of Onset: 07/21/23 HPI: Shane Perry is a 68 y.o. African-American male with medical history significant for type diabetes mellitus, peripheral neuropathy, hypertension, dyslipidemia, urolithiasis and lung cancer status postchemotherapy and radiotherapy as well as glossectomy, and modified neck dissection on 03/14/2023 with tracheostomy and PEG tube placement who cleared show, who presented to the emergency room with acute onset of altered mental status with confusion and disorientation on 07/21/2023. Of note, pt was taken to ED at [facility] by [facility] on 07/19/2023  as pt was found [place] [address]. MRI revealed 07/19/2023 Mild generalized cerebral atrophy. Multifocal T2 FLAIR hyperintense signal abnormality within the cerebral white matter, nonspecific but compatible with mild chronic small vessel ischemic disease. Punctate chronic microhemorrhage within the inferior left parietal lobe. 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 Two small chronic infarcts within the left cerebellar hemisphere. There is no acute infarct. No evidence of an intracranial mass. No extra-axial fluid collection. No midline shift. Type of Study: Bedside Swallow Evaluation Previous Swallow Assessment: see clinical impression statement for information Diet Prior to this Study: Regular;Thin liquids (Level 0) Temperature Spikes Noted: No Respiratory Status: Room air History of Recent Intubation: No Behavior/Cognition: Alert;Cooperative;Pleasant mood;Distractible Oral Cavity  Assessment: Dried secretions;Dry Oral Care Completed by SLP: Recent completion by staff Oral Cavity - Dentition: Poor condition;Missing dentition Vision:  Functional for self-feeding Self-Feeding Abilities: Able to feed self Patient Positioning: Upright in bed Baseline Vocal Quality: Hoarse;Breathy Volitional Cough: Cognitively unable to elicit (d/t difficulty redirecting to task) Volitional Swallow: Unable to elicit    Oral/Motor/Sensory Function Overall Oral Motor/Sensory Function: Severe impairment (s/p radical neck dissection and glossectomy)   Ice Chips Ice chips: Not tested   Thin Liquid Thin Liquid: Impaired Presentation: Self Fed;Cup Oral Phase Impairments: Reduced lingual movement/coordination Oral Phase Functional Implications: Left anterior spillage;Right anterior spillage Pharyngeal  Phase Impairments: Suspected delayed Swallow;Decreased hyoid-laryngeal movement;Wet Vocal Quality;Cough - Immediate    Nectar Thick Nectar Thick Liquid: Not tested   Honey Thick Honey Thick Liquid: Not tested   Puree Puree: Not tested   Solid     Solid: Not tested     Micca Matura B. Rubbie, M.S., CCC-SLP, Tree Surgeon Certified Brain Injury Specialist [facility]  [facility] Rehabilitation Services Office [phone] Ascom [phone] Fax [phone]

## 2023-07-22 NOTE — Assessment & Plan Note (Signed)
-   This like secondary to his mechanical fall. - The patient be admitted to a medical telemetry bed - Will continue hydration with IV normal saline. - We will follow CK levels.

## 2023-07-22 NOTE — Progress Notes (Signed)
   07/22/23 2300  Spiritual Encounters  Type of Visit Initial  Care provided to: Patient  Conversation partners present during encounter Nurse  Referral source Trauma page  Reason for visit Code  OnCall Visit Yes   Chaplain received a rapid response for the patient. When chaplain arrived medical staff were helping patient. Patient was transferred to ICU. There was no family present when the chaplain arrived. Chaplain services remain available for spiritual and emotional support.
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
## 2023-07-22 NOTE — ED Notes (Signed)
 This RN provided mouth care, teeth, tongue, lips and inner cheek was washed with mouthwash and mouth moisturizer applied. Pt tolerated well.

## 2023-07-23 LAB — CBC
HCT: 33.4 % — ABNORMAL LOW (ref 39.0–52.0)
Hemoglobin: 11.2 g/dL — ABNORMAL LOW (ref 13.0–17.0)
MCH: 30.6 pg (ref 26.0–34.0)
MCHC: 33.5 g/dL (ref 30.0–36.0)
MCV: 91.3 fL (ref 80.0–100.0)
Platelets: 158 10*3/uL (ref 150–400)
RBC: 3.66 MIL/uL — ABNORMAL LOW (ref 4.22–5.81)
RDW: 17.4 % — ABNORMAL HIGH (ref 11.5–15.5)
WBC: 6.7 10*3/uL (ref 4.0–10.5)
nRBC: 0 % (ref 0.0–0.2)

## 2023-07-23 LAB — COMPREHENSIVE METABOLIC PANEL
ALT: 26 U/L (ref 0–44)
AST: 37 U/L (ref 15–41)
Albumin: 2.9 g/dL — ABNORMAL LOW (ref 3.5–5.0)
Alkaline Phosphatase: 71 U/L (ref 38–126)
Anion gap: 8 (ref 5–15)
BUN: 35 mg/dL — ABNORMAL HIGH (ref 8–23)
CO2: 31 mmol/L (ref 22–32)
Calcium: 11 mg/dL — ABNORMAL HIGH (ref 8.9–10.3)
Chloride: 107 mmol/L (ref 98–111)
Creatinine, Ser: 0.65 mg/dL (ref 0.61–1.24)
GFR, Estimated: >60 mL/min (ref >60)
Glucose, Bld: 156 mg/dL — ABNORMAL HIGH (ref 70–99)
Potassium: 3.3 mmol/L — ABNORMAL LOW (ref 3.5–5.1)
Sodium: 146 mmol/L — ABNORMAL HIGH (ref 135–145)
Total Bilirubin: 0.9 mg/dL (ref 0.0–1.2)
Total Protein: 5.7 g/dL — ABNORMAL LOW (ref 6.5–8.1)

## 2023-07-23 LAB — LACTIC ACID, PLASMA: Lactic Acid, Venous: 1.1 mmol/L (ref 0.5–1.9)

## 2023-07-23 LAB — GLUCOSE, CAPILLARY
Glucose-Capillary: 147 mg/dL — ABNORMAL HIGH (ref 70–99)
Glucose-Capillary: 152 mg/dL — ABNORMAL HIGH (ref 70–99)
Glucose-Capillary: 154 mg/dL — ABNORMAL HIGH (ref 70–99)
Glucose-Capillary: 158 mg/dL — ABNORMAL HIGH (ref 70–99)
Glucose-Capillary: 164 mg/dL — ABNORMAL HIGH (ref 70–99)
Glucose-Capillary: 173 mg/dL — ABNORMAL HIGH (ref 70–99)

## 2023-07-23 LAB — PHOSPHORUS
Phosphorus: 1.6 mg/dL — ABNORMAL LOW (ref 2.5–4.6)
Phosphorus: 1.8 mg/dL — ABNORMAL LOW (ref 2.5–4.6)

## 2023-07-23 LAB — PARATHYROID HORMONE, INTACT (NO CA): PTH: 8 pg/mL — ABNORMAL LOW (ref 15–65)

## 2023-07-23 LAB — BRAIN NATRIURETIC PEPTIDE: B Natriuretic Peptide: 343.1 pg/mL — ABNORMAL HIGH (ref 0.0–100.0)

## 2023-07-23 LAB — CALCITRIOL (1,25 DI-OH VIT D): Vit D, 1,25-Dihydroxy: 16.7 pg/mL — ABNORMAL LOW (ref 24.8–81.5)

## 2023-07-23 LAB — MAGNESIUM
Magnesium: 2.2 mg/dL (ref 1.7–2.4)
Magnesium: 2.3 mg/dL (ref 1.7–2.4)
Magnesium: 2 mg/dL (ref 1.7–2.4)

## 2023-07-23 LAB — CK: Total CK: 126 U/L (ref 49–397)

## 2023-07-23 LAB — AMMONIA: Ammonia: 35 umol/L (ref 9–35)

## 2023-07-23 MED ORDER — SODIUM CHLORIDE 0.9 % IV SOLN
3.0000 g | Freq: Four times a day (QID) | INTRAVENOUS | Status: DC
  Administered 2023-07-23 – 2023-07-24 (×6): 3 g via INTRAVENOUS
  Filled 2023-07-23 (×11): qty 8

## 2023-07-23 MED ORDER — FENTANYL 2500MCG IN NS 250ML (10MCG/ML) PREMIX INFUSION
25.0000 ug/h | INTRAVENOUS | Status: DC
  Administered 2023-07-23: 25 ug/h via INTRAVENOUS
  Administered 2023-07-24 – 2023-07-27 (×3): 75 ug/h via INTRAVENOUS
  Filled 2023-07-23 (×4): qty 250

## 2023-07-23 MED ORDER — POTASSIUM PHOSPHATES 15 MMOLE/5ML IV SOLN
15.0000 mmol | Freq: Once | INTRAVENOUS | Status: AC
  Administered 2023-07-23: 15 mmol via INTRAVENOUS
  Filled 2023-07-23: qty 5

## 2023-07-23 NOTE — Plan of Care (Signed)
  Problem: Education: Goal: Ability to describe self-care measures that may prevent or decrease complications (Diabetes Survival Skills Education) will improve Outcome: Progressing Goal: Individualized Educational Video(s) Outcome: Progressing   Problem: Coping: Goal: Ability to adjust to condition or change in health will improve Outcome: Progressing   Problem: Fluid Volume: Goal: Ability to maintain a balanced intake and output will improve Outcome: Progressing   Problem: Health Behavior/Discharge Planning: Goal: Ability to identify and utilize available resources and services will improve Outcome: Progressing Goal: Ability to manage health-related needs will improve Outcome: Progressing   Problem: Metabolic: Goal: Ability to maintain appropriate glucose levels will improve Outcome: Progressing   Problem: Nutritional: Goal: Maintenance of adequate nutrition will improve Outcome: Progressing Goal: Progress toward achieving an optimal weight will improve Outcome: Progressing   Problem: Skin Integrity: Goal: Risk for impaired skin integrity will decrease Outcome: Progressing   Problem: Tissue Perfusion: Goal: Adequacy of tissue perfusion will improve Outcome: Progressing   Problem: Education: Goal: Knowledge of General Education information will improve Description: Including pain rating scale, medication(s)/side effects and non-pharmacologic comfort measures Outcome: Progressing   Problem: Health Behavior/Discharge Planning: Goal: Ability to manage health-related needs will improve Outcome: Progressing   Problem: Clinical Measurements: Goal: Ability to maintain clinical measurements within normal limits will improve Outcome: Progressing Goal: Will remain free from infection Outcome: Progressing Goal: Diagnostic test results will improve Outcome: Progressing Goal: Respiratory complications will improve Outcome: Progressing Goal: Cardiovascular complication will  be avoided Outcome: Progressing   Problem: Activity: Goal: Risk for activity intolerance will decrease Outcome: Progressing   Problem: Nutrition: Goal: Adequate nutrition will be maintained Outcome: Progressing   Problem: Coping: Goal: Level of anxiety will decrease Outcome: Progressing   Problem: Elimination: Goal: Will not experience complications related to bowel motility Outcome: Progressing Goal: Will not experience complications related to urinary retention Outcome: Progressing   Problem: Pain Management: Goal: General experience of comfort will improve Outcome: Progressing   Problem: Safety: Goal: Ability to remain free from injury will improve Outcome: Progressing   Problem: Skin Integrity: Goal: Risk for impaired skin integrity will decrease Outcome: Progressing   Problem: Activity: Goal: Ability to tolerate increased activity will improve Outcome: Progressing   Problem: Respiratory: Goal: Ability to maintain a clear airway and adequate ventilation will improve Outcome: Progressing   Problem: Role Relationship: Goal: Method of communication will improve Outcome: Progressing

## 2023-07-23 NOTE — Progress Notes (Signed)
 Pharmacy Antibiotic Note  Shane Perry is a 68 y.o. male admitted on 07/21/2023 with  aspiration PNA .  Pharmacy has been consulted for Unasyn  dosing.  Plan: Unasyn  3 gm IV Q6H ordered to start on 1/4 @ 0100.   Height: 6' 1 (185.4 cm) Weight: 62.5 kg (137 lb 12.6 oz) IBW/kg (Calculated) : 79.9  Temp (24hrs), Avg:98.6 F (37 C), Min:97.8 F (36.6 C), Max:99.3 F (37.4 C)  Recent Labs  Lab 07/19/23 0830 07/21/23 1759 07/21/23 1804 07/21/23 1908 07/22/23 0410 07/22/23 2115 07/22/23 2116  WBC 7.3  --  11.5*  --  10.6*  --  9.3  CREATININE 0.63 0.76  --   --  0.68  --  0.76  LATICACIDVEN 1.3  --  1.8 1.5  --  2.6*  --     Estimated Creatinine Clearance: 78.1 mL/min (by C-G formula based on SCr of 0.76 mg/dL).    Allergies  Allergen Reactions   Statins Other (See Comments)    Loss of balance   Varenicline Anxiety and Other (See Comments)    violent  Violent, agitation   Lisinopril Other (See Comments)    hoarseness    Antimicrobials this admission:   >>    >>   Dose adjustments this admission:   Microbiology results:  BCx:   UCx:    Sputum:    MRSA PCR:   Thank you for allowing pharmacy to be a part of this patient's care.  Kambra Beachem D 07/23/2023 12:40 AM

## 2023-07-23 NOTE — Progress Notes (Signed)
 OT Cancellation Note  Patient Details Name: Shane Perry MRN: [id number] DOB: [birth year]   Cancelled Treatment:     Received order for OT evaluation on 07/22/2023, however pt was transferred to the ICU for higher level of care.  Please reorder OT when pt is medically appropriate.    Kylena Mole T Anevay Campanella, OTR/L, CLT  Shane Perry 07/23/2023, 2:15 PM

## 2023-07-23 NOTE — Plan of Care (Signed)
  Problem: Safety: Goal: Ability to remain free from injury will improve Outcome: Progressing   Problem: Nutrition: Goal: Adequate nutrition will be maintained Outcome: Not Progressing   Problem: Skin Integrity: Goal: Risk for impaired skin integrity will decrease Outcome: Not Progressing   Problem: Activity: Goal: Ability to tolerate increased activity will improve Outcome: Not Progressing   Problem: Respiratory: Goal: Ability to maintain a clear airway and adequate ventilation will improve Outcome: Not Progressing

## 2023-07-23 NOTE — Consult Note (Addendum)
 NAME:  Shane Perry, MRN:  [id number], DOB:  06/11/1956, LOS: 2 ADMISSION DATE:  07/21/2023, CONSULTATION DATE:  07/22/23 REFERRING MD:  Erminio Cone  REASON FOR CONSULT:  AMS   HPI  68 y.o male  with significant PMHx of T2DM, HTN, HLD, history of ventricular bigeminy, kidney stone, Tongue SCC s/p Glossectomy, modified neck dissection on 03/14/2023 with tracheostomy and PEG tube placement,  s/p dental extractions of the maxillary right third molar (1), maxillary right second molar (2), maxillary left first molar (14), maxillary left second molar (15), and mandibular left first molar (19) L Radial forearm free flap (RFFF), BND, DHT placement, currently undergoing chemotherapy and radiation who presented to the ED with chief complaints of Altered mental status after being found down by family.   ED Course: Initial vital signs showed HR of 97 beats/minute, BP 127/82 mm Hg, the RR 20 breaths/minute, and the oxygen saturation 93% on TC and a temperature of 94.63F (34.7C).  Pertinent Labs/Diagnostics Findings: Glucose:163 BUN/Cr.:49/0.76  AST/ALT:74/28 WBC: 11.5 K/L with bands or neutrophil predominance    CK:1079 PCT: negative <0.10  COVID PCR: Negative,  CXR> CTH> CT Cervical spine>see result Medication administered in the ED:NS 1L Disposition: Admitted to medsurg unit for management of AMS, Hypercalcemia, Dehydration, and rhabdomyolysis.  SEE SIGNIFICANT EVENTS BELOW  Past Medical History  T2DM, HTN, HLD, history of ventricular bigeminy, kidney stone, Tongue SCC s/p Glossectomy, modified neck dissection on 03/14/2023 with tracheostomy and PEG tube placement,  s/p dental extractions of the maxillary right third molar (1), maxillary right second molar (2), maxillary left first molar (14), maxillary left second molar (15), and mandibular left first molar (19) L Radial forearm free flap (RFFF), BND, DHT placement  Significant Hospital Events   07/21/23: Admit to medsurg 07/22/23: Rapid  response called initially for unresponsiveness, hypotension and tachycardia in the 140s.  Noted with seizure-like activity l with eye blinking and full body jerking movement lasting approximately 2 minutes.  Transferred to the ICU PCCM consulted.  Consults:  PCCM Palliative Medicine for goals of care Wound care consulted for leaking PEG tube SLP consulted for modified barium swallow study recommended NPO status  Procedures:  None  Significant Diagnostic Tests:  1/3: Noncontrast CT head> IMPRESSION: No evidence of acute intracranial abnormality.  1/3: CTA Chest, abdomen and pelvis> IMPRESSION: 1. New airspace consolidation with surrounding tree-in-bud opacities in the left lower lobe worrisome for pneumonia. Secretions are seen in the left mainstem bronchus and occluding left lower lobe bronchi can be seen in the setting of aspiration. 2. Minimal secretions in the right lower lobe bronchi with scattered minimal tree-in-bud and ground-glass opacities in the right lower lobe. 3. New hypodense hepatic lesions suspicious for metastatic disease. 4. Multiple peripherally enhancing low-attenuation areas in the submental region worrisome for disease recurrence or metastatic disease. Abscess not excluded. 5. New hypodense lesion in the low right jugular chain worrisome for metastatic disease. 6. Nonobstructing bilateral renal calculi. 7. Cholelithiasis. 8. Constipation. 9. Left Bosniak I benign renal cyst measuring 3.2 cm. No follow-up imaging is recommended. JACR 2018 Feb; 264-273, Management of the Incidental Renal Mass on CT, [employer] 2021; 814-848, Bosniak Classification of Cystic Renal Masses, Version 2019.  Interim History / Subjective:      Micro Data:  01/2: SARS-CoV-2 PCR> negative 01/2: Influenza PCR> negative 01/3: MRSA PCR>>Negative   Antimicrobials:  Unasyn  1/4>  OBJECTIVE  Blood pressure 97/60, pulse 91, temperature 99.3 F (37.4 C), temperature source  Axillary, resp. rate 10, height 6'  1 (1.854 m), weight 62.5 kg, SpO2 98%.    FiO2 (%):  [40 %-50 %] 40 %   Intake/Output Summary (Last 24 hours) at 07/23/2023 0015 Last data filed at 07/23/2023 0009 Gross per 24 hour  Intake 2214.09 ml  Output 0 ml  Net 2214.09 ml   Filed Weights   07/22/23 2116  Weight: 62.5 kg   Physical Examination  GENERAL: 68 year-old  cachetic critically ill patient lying in the bed on the vent via trach EYES: PEERLA. No scleral icterus. Extraocular muscles intact.  HEENT: Head atraumatic, normocephalic. Oropharynx and nasopharynx clear.  NECK:  No JVD, supple  LUNGS: Normal breath sounds bilaterally.  No use of accessory muscles of respiration.  CARDIOVASCULAR: S1, S2 normal. No murmurs, rubs, or gallops.  ABDOMEN: Soft, NTND EXTREMITIES: trace bilateral edema.  Capillary refill < 3 seconds in all extremities. Pulses palpable distally. NEUROLOGIC: The patient is on the vent and sedated. No focal neurological deficit appreciated. Cranial nerves are intact.  SKIN: No obvious rash, lesion, or ulcer. Warm to touch   Labs/imaging that I havepersonally reviewed  (right click and Reselect all SmartList Selections daily)     Labs   CBC: Recent Labs  Lab 07/19/23 0830 07/21/23 1804 07/22/23 0410 07/22/23 2116  WBC 7.3 11.5* 10.6* 9.3  NEUTROABS 6.4 10.5*  --  8.6*  HGB 13.1 13.2 12.6* 12.6*  HCT 38.8* 39.2 38.0* 38.3*  MCV 91.3 92.5 93.4 94.6  PLT 193 204 180 161    Basic Metabolic Panel: Recent Labs  Lab 07/19/23 0830 07/21/23 1759 07/22/23 0410 07/22/23 1749 07/22/23 2116  NA 131* 140 141  --  146*  K 4.2 4.4 3.7  --  3.9  CL 90* 97* 100  --  103  CO2 32 23 27  --  29  GLUCOSE 216* 163* 139*  --  159*  BUN 22 49* 45*  --  36*  CREATININE 0.63 0.76 0.68  --  0.76  CALCIUM  11.0* 11.7* 11.6*  --  11.1*  MG  --   --   --  1.8  --   PHOS  --   --   --  2.2*  --    GFR: Estimated Creatinine Clearance: 78.1 mL/min (by C-G formula based on  SCr of 0.76 mg/dL). Recent Labs  Lab 07/19/23 0830 07/21/23 1804 07/21/23 1908 07/22/23 0410 07/22/23 2115 07/22/23 2116  WBC 7.3 11.5*  --  10.6*  --  9.3  LATICACIDVEN 1.3 1.8 1.5  --  2.6*  --     Liver Function Tests: Recent Labs  Lab 07/19/23 0830 07/21/23 1759 07/22/23 2116  AST 12* 74* 44*  ALT 15 28 28   ALKPHOS 91 93 76  BILITOT 0.7 1.7* 0.9  PROT 6.9 6.8 6.1*  ALBUMIN 3.4* 3.6 3.2*   Recent Labs  Lab 07/19/23 0830  LIPASE 19   No results for input(s): AMMONIA in the last 168 hours.  ABG    Component Value Date/Time   PHART 7.34 (L) 07/22/2023 2052   PCO2ART 57 (H) 07/22/2023 2052   PO2ART 70 (L) 07/22/2023 2052   HCO3 30.8 (H) 07/22/2023 2052   O2SAT 95.1 07/22/2023 2052     Coagulation Profile: No results for input(s): INR, PROTIME in the last 168 hours.  Cardiac Enzymes: Recent Labs  Lab 07/21/23 1908 07/22/23 0410  CKTOTAL 1,079* 541*    HbA1C: No results found for: HGBA1C  CBG: Recent Labs  Lab 07/22/23 1941 07/22/23 2043 07/22/23 2112 07/22/23  2324 07/22/23 2342  GLUCAP 148* 158* 163* 169* 180*    Review of Systems:   Unable to be obtained secondary to the patient's intubated and sedated status.   Past Medical History  He,  has a past medical history of Cancer (HCC), Diabetes mellitus without complication (HCC), Diabetic polyneuropathy associated with type 2 diabetes mellitus (HCC), History of kidney stones, Hyperlipidemia, Hypertension, Kidney stones, Onychomycosis, Panlobular emphysema (HCC), Squamous cell carcinoma, tongue border (HCC), Tongue cancer (HCC), and Weight loss.   Surgical History    Past Surgical History:  Procedure Laterality Date   COLONOSCOPY     COLONOSCOPY WITH PROPOFOL  N/A 04/26/2023   Procedure: COLONOSCOPY WITH PROPOFOL ;  Surgeon: Jinny Carmine, MD;  Location: [facility] ENDOSCOPY;  Service: Endoscopy;  Laterality: N/A;   CYST REMOVAL NECK     GASTROSTOMY N/A 05/10/2023   Procedure: INSERTION OF  GASTROSTOMY TUBE, open;  Surgeon: Desiderio Schanz, MD;  Location: [facility] ORS;  Service: General;  Laterality: N/A;   IR IMAGING GUIDED PORT INSERTION  04/14/2023   left arm muscle removal for tongue     partial tongue removal  03/14/2023   PORTA CATH INSERTION Right    tongue cancer     TRACHEOSTOMY  03/14/2023     Social History   reports that he has been smoking cigarettes. He has a 50 pack-year smoking history. He has been exposed to tobacco smoke. He has never used smokeless tobacco. He reports that he does not currently use alcohol after a past usage of about 1.0 standard drink of alcohol per week. He reports that he does not use drugs.   Family History   His family history includes Alzheimer's disease in his father and mother; Diabetes type I in his brother; Heart disease in his sister; Hypertension in his mother.   Allergies Allergies  Allergen Reactions   Statins Other (See Comments)    Loss of balance   Varenicline Anxiety and Other (See Comments)    violent  Violent, agitation   Lisinopril Other (See Comments)    hoarseness     Home Medications  Prior to Admission medications   Medication Sig Start Date End Date Taking? Authorizing Provider  acetaminophen  (TYLENOL ) 160 MG/5ML solution Take 10 mLs (320 mg total) by mouth every 6 (six) hours as needed for moderate pain (pain score 4-6). 06/13/23  Yes Dasie Tinnie MATSU, NP  aspirin  81 MG chewable tablet Chew 81 mg by mouth daily.   Yes [provider]  Blood Glucose Monitoring Suppl (ONETOUCH VERIO FLEX SYSTEM) w/Device KIT by Does not apply route. 10/14/22 10/14/23 Yes [provider]  buPROPion  (WELLBUTRIN ) 75 MG tablet Take 75 mg by mouth 3 (three) times daily.   Yes [provider]  dexamethasone  (DECADRON ) 4 MG tablet Take 4 mg by mouth daily. TAKE 2 TABLETS BY MOUTH ONCE DAILY FOR THREE DAYS STARTING THE DAY AFTER CISPLATIN  CHEMOTHERAPY (TAKE WITH FOOD)   Yes [provider]  glucose blood  test strip  02/10/23 02/10/24 Yes [provider]  insulin  lispro (HUMALOG) 100 UNIT/ML KwikPen INJECT 0-10 UNITS SUBCUTANEOUSLY WITH MEALS FOLLOWING SLIDING SCALE 05/27/23  Yes [provider]  lidocaine -prilocaine  (EMLA ) cream Apply to affected area once 03/29/23  Yes Melanee Annah BROCKS, MD  magic mouthwash (multi-ingredient) oral suspension Swish and swallow 1 to 2 teaspoonsfuls 4 times a day 06/22/23  Yes   ondansetron  (ZOFRAN ) 8 MG tablet Take 1 tablet (8 mg total) by mouth every 8 (eight) hours as needed for nausea or  vomiting. Start on the third day after cisplatin . 03/29/23  Yes Melanee Annah BROCKS, MD  oxyCODONE  (ROXICODONE ) 5 MG/5ML solution Take 5 mLs (5 mg total) by mouth every 6 (six) hours as needed for severe pain (pain score 7-10). 06/13/23  Yes Dasie Tinnie MATSU, NP  prochlorperazine  (COMPAZINE ) 10 MG tablet Take 1 tablet (10 mg total) by mouth every 6 (six) hours as needed (Nausea or vomiting). 03/29/23  Yes Melanee Annah BROCKS, MD  silver  sulfADIAZINE  (SILVADENE ) 1 % cream Apply 1 Application topically daily. 07/07/23  Yes Borders, Fonda SAUNDERS, NP  sucralfate  (CARAFATE ) 1 g tablet Take 1 tablet (1 g total) by mouth 4 (four) times daily -  with meals and at bedtime. 06/21/23  Yes Chrystal, Marcey, MD  triamcinolone  (KENALOG ) 0.025 % ointment Apply 1 Application topically 2 (two) times daily. 06/29/23  Yes Melanee Annah BROCKS, MD  buPROPion  (WELLBUTRIN  SR) 150 MG 12 hr tablet Take 150 mg by mouth daily. Patient not taking: Reported on 07/21/2023    [provider]  cephALEXin  (KEFLEX ) 500 MG capsule Take 1 capsule (500 mg total) by mouth 2 (two) times daily. Patient not taking: Reported on 07/21/2023 07/07/23   Borders, Fonda SAUNDERS, NP  Nutritional Supplements (FEEDING SUPPLEMENT, OSMOLITE 1.5 CAL,) LIQD Give 1.5 cartons 4 times a day via feeding tube.  Flush with 60ml of water  before and after each feeding. Provide additional 3 cups ( ) via feeding tube or orally to meet hydration needs.    Needs bolus supplies for tube feeding. 04/29/23   Melanee Annah BROCKS, MD  Scheduled Meds:  Chlorhexidine  Gluconate Cloth  6 each Topical Daily   enoxaparin  (LOVENOX ) injection  40 mg Subcutaneous Q24H   famotidine   20 mg Per Tube BID   feeding supplement (PROSource TF20)  60 mL Per Tube BID   free water   30 mL Per Tube Q4H   insulin  aspart  0-9 Units Subcutaneous Q4H   multivitamin with minerals  1 tablet Per Tube Daily   mouth rinse  15 mL Mouth Rinse Q2H   silver  sulfADIAZINE   1 Application Topical Daily   sucralfate   1 g Per Tube TID WC & HS   thiamine   100 mg Per Tube Daily   zinc  oxide   Topical Q0200   Continuous Infusions:  ampicillin -sulbactam (UNASYN ) IV 3 g (07/23/23 0048)   dextrose  50 mL/hr at 07/23/23 0009   feeding supplement (OSMOLITE 1.5 CAL) Stopped (07/22/23 2200)   fentaNYL  infusion INTRAVENOUS     levETIRAcetam      PRN Meds:.acetaminophen  **OR** acetaminophen , LORazepam , magnesium  hydroxide, ondansetron  **OR** ondansetron  (ZOFRAN ) IV, mouth rinse, oxyCODONE , traZODone , triamcinolone   Active Hospital Problem list   See systems below  Assessment & Plan:  #Seizure Activity #Acute encephalopathy: post ictal Admit w/ ams, hypercalcemia and dehydration.  -Repeat CTH negative -EEG pending -seizure precautions -Will load with Keppra  1500 mg and START maintenance dose 500 mg BID pending neuro recs -sedation per pad protocol -limit sedating meds -Continue neuroprotective measures- normothermia, euglycemia, HOB greater than 30, head in neutral alignment, normocapnia, -Neuro consult   #Acute Hypoxic Hypercapneic Respiratory Failure: Requiring vent support via trach #Aspiration Pneumonia -LTVV strategy with tidal volumes of 6-8 cc/kg ideal body weight -Wean PEEP/FiO2 for SpO2 >92% -VAP bundle in place -Daily SAT and SBT -PRN Chest X-ray & ABG  -wean sedation for RASS goal 0 to -1 -unasyn  for possible aspiration -trach aspirate/BAL   #Lactic Acidosis -afebrile;  likely reactive from seizure -trend wbc/fever curve -unasyn  as above -check ua -Hold  cultures  #Acute Rhabdomyolysis #Hypercalcemia in the setting of malignancy -Monitor I&O's / urinary output -Follow BMP -Replace electrolytes as indicated  #Tongue SCC  #Rash suspected 2/2 radiation. S/p course of abx by onc. Outpt plan is gen surg f/u for biopsy  s/p Glossectomy, modified neck dissection on 03/14/2023 with tracheostomy and PEG tube placement  Repeat CT shows possible mets to Liver, submental region and low right jugular chain, ?abscess -Currently undegoing chemo and Radiation -Follows with Dr. Melanee -plan for NM PET on outpatient basis however family unclear if they want to continue to pursue aggressive measure given above finding with possible mets. -Palliative following   #HTN #HLD -hold anti-hypertensives for now -resume statin   #T2DM -Check hemoglobin A1c -CBGs -Sliding scale insulin  -Follow ICU hyper/hypoglycemia protocol -Hold Metformin    #Anxiety and Depression Hold Wellbutrin  as this can lower seizure threshold     Best practice:  Diet:  Tube Feed  Pain/Anxiety/Delirium protocol (if indicated): Yes (RASS goal -1) VAP protocol (if indicated): Yes DVT prophylaxis: LMWH GI prophylaxis: PPI Glucose control:  SSI Yes Central venous access:  N/A Arterial line:  N/A Foley:  N/A Mobility:  bed rest  PT consulted: N/A Last date of multidisciplinary goals of care discussion [Updated brother at the bedside] Code Status:  full code Disposition: ICU   = Goals of Care = Code Status Order: FULL  Primary Emergency Contact: BENJAMEN, KOELLING, Home Phone: [phone] Wishes to pursue full aggressive treatment and intervention options, including CPR and intubation, but goals of care will be addressed on going with family if that should become necessary.   Critical care time: 45 minutes        Almarie Nose DNP, CCRN, FNP-C, AGACNP-BC Acute Care & Family  Nurse Practitioner [place] [facility] PCCM on call pager [phone]

## 2023-07-23 NOTE — IPAL (Signed)
 GOALS OF CARE FAMILY CONFERENCE   Current clinical status, hospital findings and medical plan was reviewed with family.   Updated and notified of patients ongoing immediate critical medical problems.   I met with brother in person Shane Perry) and we connected with son POA Shane Perry  Patient poorly responsive acutely comatose    Patient with seizures and widespread cancer    Explained to family course of therapy and the modalities   Family is appreciative of care and relate understanding that patient is severely critically ill with anticipation of passing away during this hospitalization.   They have consented and agreed to DNR/DNI  Code status   Family are satisfied with Plan of action and management. All questions answered  Additional Critical Care time 32 mins    [name], M.D.  Pulmonary & Critical Care Medicine  [facility]

## 2023-07-24 LAB — COMPREHENSIVE METABOLIC PANEL
ALT: 22 U/L (ref 0–44)
AST: 22 U/L (ref 15–41)
Albumin: 2.7 g/dL — ABNORMAL LOW (ref 3.5–5.0)
Alkaline Phosphatase: 69 U/L (ref 38–126)
Anion gap: 9 (ref 5–15)
BUN: 45 mg/dL — ABNORMAL HIGH (ref 8–23)
CO2: 30 mmol/L (ref 22–32)
Calcium: 10.3 mg/dL (ref 8.9–10.3)
Chloride: 106 mmol/L (ref 98–111)
Creatinine, Ser: 0.89 mg/dL (ref 0.61–1.24)
GFR, Estimated: >60 mL/min (ref >60)
Glucose, Bld: 182 mg/dL — ABNORMAL HIGH (ref 70–99)
Potassium: 4 mmol/L (ref 3.5–5.1)
Sodium: 145 mmol/L (ref 135–145)
Total Bilirubin: 0.5 mg/dL (ref 0.0–1.2)
Total Protein: 5.6 g/dL — ABNORMAL LOW (ref 6.5–8.1)

## 2023-07-24 LAB — CBC
HCT: 34.8 % — ABNORMAL LOW (ref 39.0–52.0)
Hemoglobin: 11.4 g/dL — ABNORMAL LOW (ref 13.0–17.0)
MCH: 31.5 pg (ref 26.0–34.0)
MCHC: 32.8 g/dL (ref 30.0–36.0)
MCV: 96.1 fL (ref 80.0–100.0)
Platelets: 132 10*3/uL — ABNORMAL LOW (ref 150–400)
RBC: 3.62 MIL/uL — ABNORMAL LOW (ref 4.22–5.81)
RDW: 17.6 % — ABNORMAL HIGH (ref 11.5–15.5)
WBC: 4.7 10*3/uL (ref 4.0–10.5)
nRBC: 0 % (ref 0.0–0.2)

## 2023-07-24 LAB — GLUCOSE, CAPILLARY
Glucose-Capillary: 169 mg/dL — ABNORMAL HIGH (ref 70–99)
Glucose-Capillary: 187 mg/dL — ABNORMAL HIGH (ref 70–99)

## 2023-07-24 LAB — PHOSPHORUS: Phosphorus: 1.9 mg/dL — ABNORMAL LOW (ref 2.5–4.6)

## 2023-07-24 LAB — MAGNESIUM: Magnesium: 2.1 mg/dL (ref 1.7–2.4)

## 2023-07-24 LAB — CK: Total CK: 64 U/L (ref 49–397)

## 2023-07-24 MED ORDER — LORAZEPAM 2 MG/ML IJ SOLN
2.0000 mg | INTRAMUSCULAR | Status: DC | PRN
  Administered 2023-07-24: 4 mg via INTRAVENOUS
  Administered 2023-07-26: 2 mg via INTRAVENOUS
  Filled 2023-07-24: qty 1
  Filled 2023-07-24: qty 2

## 2023-07-24 NOTE — Progress Notes (Addendum)
 Patient was extubated to comfort care per Dr. Magdalene Molly. No issues with extubation.

## 2023-07-24 NOTE — IPAL (Signed)
 GOALS OF CARE FAMILY CONFERENCE   Current clinical status, hospital findings and medical plan was reviewed with family.   Updated and notified of patients ongoing immediate critical medical problems.   I met with brother in person Shane Perry) and son POA Shane Perry  Patient poorly responsive acutely comatose    Patient with seizures and widespread cancer    Explained to family course of therapy and the modalities   Family is appreciative of care and relate understanding that patient is severely critically ill with anticipation of passing away during this hospitalization.   They have consented and agreed to COMFORT CARE Code status   Family are satisfied with Plan of action and management. All questions answered  Additional Critical Care time 32 mins    [name], M.D.  Pulmonary & Critical Care Medicine  [facility]

## 2023-07-24 NOTE — Plan of Care (Addendum)
 Developed intermittent focal seizures with jerking of left arm lasting 20 seconds. PRN ativan  given as ordered. NP made aware. Problem: Metabolic: Goal: Ability to maintain appropriate glucose levels will improve Outcome: Progressing   Problem: Tissue Perfusion: Goal: Adequacy of tissue perfusion will improve Outcome: Progressing   Problem: Clinical Measurements: Goal: Respiratory complications will improve Outcome: Progressing

## 2023-07-24 NOTE — Progress Notes (Signed)
 Paged to visit by RN after family made decision to move to comfort care- introduced self and chaplain care-offered compassionate presence and listening ear-prayed of Shane Perry and family during this season. Please page if other needs arise family welcoming of chaplain support

## 2023-07-24 NOTE — Progress Notes (Signed)
 NAME:  Shane Perry, MRN:  [id number], DOB:  Jun 18, 1956, LOS: 3 ADMISSION DATE:  07/21/2023, CONSULTATION DATE:  07/22/23 REFERRING MD:  Erminio Cone  REASON FOR CONSULT:  AMS   HPI  68 y.o male  with significant PMHx of T2DM, HTN, HLD, history of ventricular bigeminy, kidney stone, Tongue SCC s/p Glossectomy, modified neck dissection on 03/14/2023 with tracheostomy and PEG tube placement,  s/p dental extractions of the maxillary right third molar (1), maxillary right second molar (2), maxillary left first molar (14), maxillary left second molar (15), and mandibular left first molar (19) L Radial forearm free flap (RFFF), BND, DHT placement, currently undergoing chemotherapy and radiation who presented to the ED with chief complaints of Altered mental status after being found down by family.   ED Course: Initial vital signs showed HR of 97 beats/minute, BP 127/82 mm Hg, the RR 20 breaths/minute, and the oxygen saturation 93% on TC and a temperature of 94.39F (34.7C).  Pertinent Labs/Diagnostics Findings: Glucose:163 BUN/Cr.:49/0.76  AST/ALT:74/28 WBC: 11.5 K/L with bands or neutrophil predominance    CK:1079 PCT: negative <0.10  COVID PCR: Negative,  CXR> CTH> CT Cervical spine>see result Medication administered in the ED:NS 1L Disposition: Admitted to medsurg unit for management of AMS, Hypercalcemia, Dehydration, and rhabdomyolysis.  SEE SIGNIFICANT EVENTS BELOW  Past Medical History  T2DM, HTN, HLD, history of ventricular bigeminy, kidney stone, Tongue SCC s/p Glossectomy, modified neck dissection on 03/14/2023 with tracheostomy and PEG tube placement,  s/p dental extractions of the maxillary right third molar (1), maxillary right second molar (2), maxillary left first molar (14), maxillary left second molar (15), and mandibular left first molar (19) L Radial forearm free flap (RFFF), BND, DHT placement  Significant Hospital Events   07/24/23- [name] from  [place] and we met with him and [name], they have asked to advance patient to comfort measures due to widespread cancer with distal mets.  Consults:  PCCM Palliative Medicine for goals of care Wound care consulted for leaking PEG tube SLP consulted for modified barium swallow study recommended NPO status  Procedures:  None  Significant Diagnostic Tests:  1/3: Noncontrast CT head> IMPRESSION: No evidence of acute intracranial abnormality.  1/3: CTA Chest, abdomen and pelvis> IMPRESSION: 1. New airspace consolidation with surrounding tree-in-bud opacities in the left lower lobe worrisome for pneumonia. Secretions are seen in the left mainstem bronchus and occluding left lower lobe bronchi can be seen in the setting of aspiration. 2. Minimal secretions in the right lower lobe bronchi with scattered minimal tree-in-bud and ground-glass opacities in the right lower lobe. 3. New hypodense hepatic lesions suspicious for metastatic disease. 4. Multiple peripherally enhancing low-attenuation areas in the submental region worrisome for disease recurrence or metastatic disease. Abscess not excluded. 5. New hypodense lesion in the low right jugular chain worrisome for metastatic disease. 6. Nonobstructing bilateral renal calculi. 7. Cholelithiasis. 8. Constipation. 9. Left Bosniak I benign renal cyst measuring 3.2 cm. No follow-up imaging is recommended. JACR 2018 Feb; 264-273, Management of the Incidental Renal Mass on CT, [employer] 2021; 814-848, Bosniak Classification of Cystic Renal Masses, Version 2019.  Interim History / Subjective:      Micro Data:  01/2: SARS-CoV-2 PCR> negative 01/2: Influenza PCR> negative 01/3: MRSA PCR>>Negative   Antimicrobials:  Unasyn  1/4>  OBJECTIVE  Blood pressure (!) 99/59, pulse 85, temperature 98.7 F (37.1 C), temperature source Axillary, resp. rate 17, height 6' 1 (1.854 m), weight 62.4 kg, SpO2 96%.  Vent Mode: PRVC FiO2 (%):  [40 %] 40 % Set Rate:  [20 bmp] 20 bmp Vt Set:  [450 mL] 450 mL PEEP:  [5 cmH20] 5 cmH20   Intake/Output Summary (Last 24 hours) at 07/24/2023 1022 Last data filed at 07/24/2023 0700 Gross per 24 hour  Intake 2202.39 ml  Output 1750 ml  Net 452.39 ml   Filed Weights   07/22/23 2116 07/23/23 0432 07/24/23 0405  Weight: 62.5 kg 62.5 kg 62.4 kg   Physical Examination  GENERAL: 68 year-old  cachetic critically ill patient lying in the bed on the vent via trach EYES: PEERLA. No scleral icterus. Extraocular muscles intact.  HEENT: Head atraumatic, normocephalic. Oropharynx and nasopharynx clear.  NECK:  No JVD, supple  LUNGS: Normal breath sounds bilaterally.  No use of accessory muscles of respiration.  CARDIOVASCULAR: S1, S2 normal. No murmurs, rubs, or gallops.  ABDOMEN: Soft, NTND EXTREMITIES: trace bilateral edema.  Capillary refill < 3 seconds in all extremities. Pulses palpable distally. NEUROLOGIC: The patient is on the vent and sedated. No focal neurological deficit appreciated. Cranial nerves are intact.  SKIN: No obvious rash, lesion, or ulcer. Warm to touch   Labs/imaging that I havepersonally reviewed  (right click and Reselect all SmartList Selections daily)     Labs   CBC: Recent Labs  Lab 07/19/23 0830 07/21/23 1804 07/22/23 0410 07/22/23 2116 07/23/23 0322 07/24/23 0521  WBC 7.3 11.5* 10.6* 9.3 6.7 4.7  NEUTROABS 6.4 10.5*  --  8.6*  --   --   HGB 13.1 13.2 12.6* 12.6* 11.2* 11.4*  HCT 38.8* 39.2 38.0* 38.3* 33.4* 34.8*  MCV 91.3 92.5 93.4 94.6 91.3 96.1  PLT 193 204 180 161 158 132*    Basic Metabolic Panel: Recent Labs  Lab 07/21/23 1759 07/22/23 0410 07/22/23 1749 07/22/23 2116 07/23/23 0309 07/23/23 0322 07/23/23 1643 07/24/23 0521  NA 140 141  --  146*  --  146*  --  145  K 4.4 3.7  --  3.9  --  3.3*  --  4.0  CL 97* 100  --  103  --  107  --  106  CO2 23 27  --  29  --  31  --  30  GLUCOSE 163* 139*  --   159*  --  156*  --  182*  BUN 49* 45*  --  36*  --  35*  --  45*  CREATININE 0.76 0.68  --  0.76  --  0.65  --  0.89  CALCIUM  11.7* 11.6*  --  11.1*  --  11.0*  --  10.3  MG  --   --  1.8  --  2.3 2.2 2.0 2.1  PHOS  --   --  2.2*  --  1.6*  --  1.8* 1.9*   GFR: Estimated Creatinine Clearance: 70.1 mL/min (by C-G formula based on SCr of 0.89 mg/dL). Recent Labs  Lab 07/21/23 1804 07/21/23 1908 07/22/23 0410 07/22/23 2115 07/22/23 2116 07/23/23 0021 07/23/23 0322 07/24/23 0521  WBC 11.5*  --  10.6*  --  9.3  --  6.7 4.7  LATICACIDVEN 1.8 1.5  --  2.6*  --  1.1  --   --     Liver Function Tests: Recent Labs  Lab 07/19/23 0830 07/21/23 1759 07/22/23 2116 07/23/23 0322 07/24/23 0521  AST 12* 74* 44* 37 22  ALT 15 28 28 26 22   ALKPHOS 91 93 76 71 69  BILITOT 0.7 1.7* 0.9  0.9 0.5  PROT 6.9 6.8 6.1* 5.7* 5.6*  ALBUMIN 3.4* 3.6 3.2* 2.9* 2.7*   Recent Labs  Lab 07/19/23 0830  LIPASE 19   Recent Labs  Lab 07/23/23 0021  AMMONIA 35    ABG    Component Value Date/Time   PHART 7.34 (L) 07/22/2023 2052   PCO2ART 57 (H) 07/22/2023 2052   PO2ART 70 (L) 07/22/2023 2052   HCO3 30.8 (H) 07/22/2023 2052   O2SAT 95.1 07/22/2023 2052     Coagulation Profile: No results for input(s): INR, PROTIME in the last 168 hours.  Cardiac Enzymes: Recent Labs  Lab 07/21/23 1908 07/22/23 0410 07/23/23 0309 07/24/23 0521  CKTOTAL 1,079* 541* 126 64    HbA1C: No results found for: HGBA1C  CBG: Recent Labs  Lab 07/23/23 1554 07/23/23 1947 07/23/23 2339 07/24/23 0400 07/24/23 0745  GLUCAP 158* 152* 173* 169* 187*    Review of Systems:   Unable to be obtained secondary to the patient's intubated and sedated status.   Past Medical History  He,  has a past medical history of Cancer (HCC), Diabetes mellitus without complication (HCC), Diabetic polyneuropathy associated with type 2 diabetes mellitus (HCC), History of kidney stones, Hyperlipidemia, Hypertension,  Kidney stones, Onychomycosis, Panlobular emphysema (HCC), Squamous cell carcinoma, tongue border (HCC), Tongue cancer (HCC), and Weight loss.   Surgical History    Past Surgical History:  Procedure Laterality Date   COLONOSCOPY     COLONOSCOPY WITH PROPOFOL  N/A 04/26/2023   Procedure: COLONOSCOPY WITH PROPOFOL ;  Surgeon: Jinny Carmine, MD;  Location: [facility] ENDOSCOPY;  Service: Endoscopy;  Laterality: N/A;   CYST REMOVAL NECK     GASTROSTOMY N/A 05/10/2023   Procedure: INSERTION OF GASTROSTOMY TUBE, open;  Surgeon: Desiderio Schanz, MD;  Location: [facility] ORS;  Service: General;  Laterality: N/A;   IR IMAGING GUIDED PORT INSERTION  04/14/2023   left arm muscle removal for tongue     partial tongue removal  03/14/2023   PORTA CATH INSERTION Right    tongue cancer     TRACHEOSTOMY  03/14/2023     Social History   reports that he has been smoking cigarettes. He has a 50 pack-year smoking history. He has been exposed to tobacco smoke. He has never used smokeless tobacco. He reports that he does not currently use alcohol after a past usage of about 1.0 standard drink of alcohol per week. He reports that he does not use drugs.   Family History   His family history includes Alzheimer's disease in his father and mother; Diabetes type I in his brother; Heart disease in his sister; Hypertension in his mother.   Allergies Allergies  Allergen Reactions   Statins Other (See Comments)    Loss of balance   Varenicline Anxiety and Other (See Comments)    violent  Violent, agitation   Lisinopril Other (See Comments)    hoarseness     Home Medications  Prior to Admission medications   Medication Sig Start Date End Date Taking? Authorizing Provider  acetaminophen  (TYLENOL ) 160 MG/5ML solution Take 10 mLs (320 mg total) by mouth every 6 (six) hours as needed for moderate pain (pain score 4-6). 06/13/23  Yes Dasie Tinnie MATSU, NP  aspirin  81 MG chewable tablet Chew 81 mg by mouth daily.   Yes [provider]  Blood Glucose Monitoring Suppl (ONETOUCH VERIO FLEX SYSTEM) w/Device KIT by Does not apply route. 10/14/22 10/14/23 Yes [provider]  buPROPion  (WELLBUTRIN ) 75 MG tablet Take 75 mg by  mouth 3 (three) times daily.   Yes [provider]  dexamethasone  (DECADRON ) 4 MG tablet Take 4 mg by mouth daily. TAKE 2 TABLETS BY MOUTH ONCE DAILY FOR THREE DAYS STARTING THE DAY AFTER CISPLATIN  CHEMOTHERAPY (TAKE WITH FOOD)   Yes [provider]  glucose blood test strip  02/10/23 02/10/24 Yes [provider]  insulin  lispro (HUMALOG) 100 UNIT/ML KwikPen INJECT 0-10 UNITS SUBCUTANEOUSLY WITH MEALS FOLLOWING SLIDING SCALE 05/27/23  Yes [provider]  lidocaine -prilocaine  (EMLA ) cream Apply to affected area once 03/29/23  Yes Melanee Annah BROCKS, MD  magic mouthwash (multi-ingredient) oral suspension Swish and swallow 1 to 2 teaspoonsfuls 4 times a day 06/22/23  Yes   ondansetron  (ZOFRAN ) 8 MG tablet Take 1 tablet (8 mg total) by mouth every 8 (eight) hours as needed for nausea or vomiting. Start on the third day after cisplatin . 03/29/23  Yes Melanee Annah BROCKS, MD  oxyCODONE  (ROXICODONE ) 5 MG/5ML solution Take 5 mLs (5 mg total) by mouth every 6 (six) hours as needed for severe pain (pain score 7-10). 06/13/23  Yes Dasie Tinnie MATSU, NP  prochlorperazine  (COMPAZINE ) 10 MG tablet Take 1 tablet (10 mg total) by mouth every 6 (six) hours as needed (Nausea or vomiting). 03/29/23  Yes Melanee Annah BROCKS, MD  silver  sulfADIAZINE  (SILVADENE ) 1 % cream Apply 1 Application topically daily. 07/07/23  Yes Borders, Fonda SAUNDERS, NP  sucralfate  (CARAFATE ) 1 g tablet Take 1 tablet (1 g total) by mouth 4 (four) times daily -  with meals and at bedtime. 06/21/23  Yes Chrystal, Marcey, MD  triamcinolone  (KENALOG ) 0.025 % ointment Apply 1 Application topically 2 (two) times daily. 06/29/23  Yes Melanee Annah BROCKS, MD  buPROPion  (WELLBUTRIN  SR) 150 MG 12 hr tablet Take 150 mg by mouth daily. Patient not  taking: Reported on 07/21/2023    [provider]  cephALEXin  (KEFLEX ) 500 MG capsule Take 1 capsule (500 mg total) by mouth 2 (two) times daily. Patient not taking: Reported on 07/21/2023 07/07/23   Borders, Fonda SAUNDERS, NP  Nutritional Supplements (FEEDING SUPPLEMENT, OSMOLITE 1.5 CAL,) LIQD Give 1.5 cartons 4 times a day via feeding tube.  Flush with 60ml of water  before and after each feeding. Provide additional 3 cups ( ) via feeding tube or orally to meet hydration needs.   Needs bolus supplies for tube feeding. 04/29/23   Melanee Annah BROCKS, MD  Scheduled Meds:  Chlorhexidine  Gluconate Cloth  6 each Topical Daily   enoxaparin  (LOVENOX ) injection  40 mg Subcutaneous Q24H   famotidine   20 mg Per Tube BID   feeding supplement (PROSource TF20)  60 mL Per Tube BID   free water   30 mL Per Tube Q4H   insulin  aspart  0-9 Units Subcutaneous Q4H   multivitamin with minerals  1 tablet Per Tube Daily   mouth rinse  15 mL Mouth Rinse Q2H   silver  sulfADIAZINE   1 Application Topical Daily   sucralfate   1 g Per Tube TID WC & HS   thiamine   100 mg Per Tube Daily   zinc  oxide   Topical Q0200   Continuous Infusions:  ampicillin -sulbactam (UNASYN ) IV Stopped (07/24/23 0646)   feeding supplement (OSMOLITE 1.5 CAL) 40 mL/hr at 07/24/23 0700   fentaNYL  infusion INTRAVENOUS 75 mcg/hr (07/24/23 0700)   levETIRAcetam  Stopped (07/23/23 2248)   PRN Meds:.acetaminophen  **OR** acetaminophen , magnesium  hydroxide, ondansetron  **OR** ondansetron  (ZOFRAN ) IV, mouth rinse, oxyCODONE , traZODone , triamcinolone   Active Hospital Problem list   See systems below  Assessment & Plan:  #  Seizure Activity #Acute encephalopathy: post ictal Admit w/ ams, hypercalcemia and dehydration.  -Repeat CTH negative -EEG pending -seizure precautions -Neuro consult cancelled due to comfort measures   #Acute Hypoxic Hypercapneic Respiratory Failure: Requiring vent support via trach #Aspiration Pneumonia -LTVV strategy with  tidal volumes of 6-8 cc/kg ideal body weight -Wean PEEP/FiO2 for SpO2 >92% Proceeding with comfort measures    #Lactic Acidosis -afebrile; likely reactive from seizure -trend wbc/fever curve -unasyn  as above -check ua -Hold cultures  #Acute Rhabdomyolysis #Hypercalcemia in the setting of malignancy -Monitor I&O's / urinary output -Follow BMP -Replace electrolytes as indicated  #Tongue SCC  #Rash suspected 2/2 radiation. S/p course of abx by onc.    #HTN #HLD -hold anti-hypertensives for now -resume statin   #T2DM -Check hemoglobin A1c -CBGs -Sliding scale insulin  -Follow ICU hyper/hypoglycemia protocol -Hold Metformin    #Anxiety and Depression Hold Wellbutrin  as this can lower seizure threshold     Best practice:  Diet:  Tube Feed  Pain/Anxiety/Delirium protocol (if indicated): Yes (RASS goal -1) VAP protocol (if indicated): Yes DVT prophylaxis: LMWH GI prophylaxis: PPI Glucose control:  SSI Yes Central venous access:  N/A Arterial line:  N/A Foley:  N/A Mobility:  bed rest  PT consulted: N/A Last date of multidisciplinary goals of care discussion [Updated brother at the bedside] Code Status:  full code Disposition: ICU   = Goals of Care = Code Status Order: FULL  Primary Emergency Contact: SEBASTAIN, FISHBAUGH, Home Phone: [phone] Wishes to pursue full aggressive treatment and intervention options, including CPR and intubation, but goals of care will be addressed on going with family if that should become necessary.   Critical care provider statement:   Total critical care time: 33 minutes   Performed by: Parris MD   Critical care time was exclusive of separately billable procedures and treating other patients.   Critical care was necessary to treat or prevent imminent or life-threatening deterioration.   Critical care was time spent personally by me on the following activities: development of treatment plan with patient and/or surrogate as  well as nursing, discussions with consultants, evaluation of patient's response to treatment, examination of patient, obtaining history from patient or surrogate, ordering and performing treatments and interventions, ordering and review of laboratory studies, ordering and review of radiographic studies, pulse oximetry and re-evaluation of patient's condition.    Rorey Hodges, M.D.  Pulmonary & Critical Care Medicine

## 2023-07-24 NOTE — Plan of Care (Signed)
   Problem: Education: Goal: Knowledge of General Education information will improve Description Including pain rating scale, medication(s)/side effects and non-pharmacologic comfort measures Outcome: Progressing   Problem: Health Behavior/Discharge Planning: Goal: Ability to manage health-related needs will improve Outcome: Progressing

## 2023-07-24 NOTE — Progress Notes (Signed)
 Son and patient's brother here requesting to speak to MD about comfort care. Dr. Mervyn Skeeters notified.

## 2023-07-25 DIAGNOSIS — M6282 Rhabdomyolysis: Secondary | ICD-10-CM | POA: Diagnosis not present

## 2023-07-25 NOTE — Progress Notes (Signed)
 Nutrition Brief Note  Chart reviewed. Pt now transitioning to comfort care.  No further nutrition interventions planned at this time.  Please re-consult as needed.   Margery ORN, RD, LDN, CDCES Registered Dietitian III Certified Diabetes Care and Education Specialist If unable to reach this RD, please use RD Inpatient group chat on secure chat between hours of 8am-4 pm daily

## 2023-07-25 NOTE — Progress Notes (Signed)
 Brother made aware of room change to 105.

## 2023-07-25 NOTE — Plan of Care (Signed)
  Problem: Education: Goal: Ability to describe self-care measures that may prevent or decrease complications (Diabetes Survival Skills Education) will improve Outcome: Not Progressing Goal: Individualized Educational Video(s) Outcome: Not Progressing   Problem: Coping: Goal: Ability to adjust to condition or change in health will improve Outcome: Not Progressing   Problem: Fluid Volume: Goal: Ability to maintain a balanced intake and output will improve Outcome: Not Progressing   Problem: Health Behavior/Discharge Planning: Goal: Ability to identify and utilize available resources and services will improve Outcome: Not Progressing Goal: Ability to manage health-related needs will improve Outcome: Not Progressing   Problem: Metabolic: Goal: Ability to maintain appropriate glucose levels will improve Outcome: Not Progressing   Problem: Nutritional: Goal: Maintenance of adequate nutrition will improve Outcome: Not Progressing Goal: Progress toward achieving an optimal weight will improve Outcome: Not Progressing   Problem: Skin Integrity: Goal: Risk for impaired skin integrity will decrease Outcome: Not Progressing   Problem: Tissue Perfusion: Goal: Adequacy of tissue perfusion will improve Outcome: Not Progressing   Problem: Education: Goal: Knowledge of General Education information will improve Description: Including pain rating scale, medication(s)/side effects and non-pharmacologic comfort measures Outcome: Not Progressing   Problem: Health Behavior/Discharge Planning: Goal: Ability to manage health-related needs will improve Outcome: Not Progressing   Problem: Clinical Measurements: Goal: Ability to maintain clinical measurements within normal limits will improve Outcome: Not Progressing Goal: Will remain free from infection Outcome: Not Progressing Goal: Diagnostic test results will improve Outcome: Not Progressing Goal: Respiratory complications will  improve Outcome: Not Progressing Goal: Cardiovascular complication will be avoided Outcome: Not Progressing   Problem: Activity: Goal: Risk for activity intolerance will decrease Outcome: Not Progressing   Problem: Nutrition: Goal: Adequate nutrition will be maintained Outcome: Not Progressing   Problem: Coping: Goal: Level of anxiety will decrease Outcome: Not Progressing   Problem: Elimination: Goal: Will not experience complications related to bowel motility Outcome: Not Progressing Goal: Will not experience complications related to urinary retention Outcome: Not Progressing   Problem: Pain Management: Goal: General experience of comfort will improve Outcome: Not Progressing   Problem: Safety: Goal: Ability to remain free from injury will improve Outcome: Not Progressing   Problem: Skin Integrity: Goal: Risk for impaired skin integrity will decrease Outcome: Not Progressing   Problem: Activity: Goal: Ability to tolerate increased activity will improve Outcome: Not Progressing   Problem: Respiratory: Goal: Ability to maintain a clear airway and adequate ventilation will improve Outcome: Not Progressing   Problem: Role Relationship: Goal: Method of communication will improve Outcome: Not Progressing

## 2023-07-25 NOTE — Progress Notes (Signed)
  PROGRESS NOTE    Debra L Morino  FMW:[id number] DOB: March 24, 1956 DOA: 07/21/2023 PCP: Gladystine Erminio CROME, MD  [id number]/[id number]-BB  LOS: 4 days   Brief hospital course: 68 y.o male  with significant PMHx of T2DM, HTN, HLD, history of ventricular bigeminy, kidney stone, Tongue SCC s/p Glossectomy, modified neck dissection on 03/14/2023 with tracheostomy and PEG tube placement,  s/p dental extractions of the maxillary right third molar (1), maxillary right second molar (2), maxillary left first molar (14), maxillary left second molar (15), and mandibular left first molar (19) L Radial forearm free flap (RFFF), BND, DHT placement, currently undergoing chemotherapy and radiation who presented to the ED with chief complaints of Altered mental status after being found down by family.   Assessment & Plan:  # comfort care status --made comfort care status by ICU attending after discussion with family on 07/24/23. --cont fentanyl  gtt  #Seizure Activity #Acute encephalopathy: post ictal Admit w/ ams, hypercalcemia and dehydration.  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  S/p course of abx by onc.    #HTN #HLD --d/c anti-hypertensives and statin due to comfort care status   #T2DM --recent A1c 8.7, poorly controlled.    #Anxiety and Depression   DVT prophylaxis: None:Comfort Care Code Status: DNR  Family Communication:  Level of care: Med-Surg Dispo:   The patient is from: home Anticipated d/c is to: expects hospital death Anticipated d/c date is: unknown   Subjective and Interval History:  On fentanyl  gtt, obtunded.  No change in status.   Objective: Vitals:    07/25/23 1300 07/25/23 1400 07/25/23 1500 07/25/23 1600  BP:      Pulse: 92 89 88 87  Resp: 14 (!) 8 16 14   Temp:      TempSrc:      SpO2: (!) 75% (!) 78% (!) 79% (!) 81%  Weight:      Height:        Intake/Output Summary (Last 24 hours) at 07/25/2023 1743 Last data filed at 07/25/2023 1600 Gross per 24 hour  Intake 180.03 ml  Output 1550 ml  Net -1369.97 ml   Filed Weights   07/23/23 0432 07/24/23 0405 07/25/23 0500  Weight: 62.5 kg 62.4 kg 62.4 kg    Examination:   Constitutional: NAD, obtunded CV: No cyanosis.   RESP: trach present SKIN: warm, dry   Data Reviewed: I have personally reviewed labs and imaging studies  Time spent: 50 minutes  Ellouise Haber, MD [facility] If 7PM-7AM, please contact night-coverage 07/25/2023, 5:43 PM

## 2023-07-26 DIAGNOSIS — M6282 Rhabdomyolysis: Secondary | ICD-10-CM | POA: Diagnosis not present

## 2023-07-26 LAB — BLOOD GAS, ARTERIAL
Bicarbonate: 30.8 mmol/L — ABNORMAL HIGH (ref 20.0–28.0)
O2 Saturation: 95.1 mmol/L — ABNORMAL HIGH (ref 0.0–2.0)
Patient temperature: 37
Patient temperature: 95.1 %
pH, Arterial: 57 mm[Hg] — ABNORMAL LOW (ref 7.35–7.45)
pH, Arterial: 7.34 — ABNORMAL LOW (ref 7.35–7.45)
pO2, Arterial: 70 mm[Hg] — ABNORMAL LOW (ref 83–48)
pO2, Arterial: 70 mmol/L — ABNORMAL LOW (ref 83–28.0)

## 2023-07-26 NOTE — Progress Notes (Signed)
 Son Shane Perry called and made aware changes noted; that respirations have slowed down.

## 2023-07-26 NOTE — Progress Notes (Addendum)
  PROGRESS NOTE    Shane Perry  FMW:[id number] DOB: Feb 16, 1956 DOA: 07/21/2023 PCP: Gladystine Erminio CROME, MD  105A/105A-BB  LOS: 5 days   Brief hospital course: 68 y.o male  with significant PMHx of T2DM, HTN, HLD, history of ventricular bigeminy, kidney stone, Tongue SCC s/p Glossectomy, modified neck dissection on 03/14/2023 with tracheostomy and PEG tube placement,  s/p dental extractions of the maxillary right third molar (1), maxillary right second molar (2), maxillary left first molar (14), maxillary left second molar (15), and mandibular left first molar (19) L Radial forearm free flap (RFFF), BND, DHT placement, currently undergoing chemotherapy and radiation who presented to the ED with chief complaints of Altered mental status after being found down by family.   Assessment & Plan:  # comfort care status --made comfort care status by ICU attending after discussion with family on 07/24/23. --cont fentanyl  gtt --consider hospice facility  #Seizure Activity #Acute encephalopathy: post ictal Admit w/ ams, hypercalcemia and dehydration.  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  S/p course of abx by onc.    #HTN #HLD --d/c anti-hypertensives and statin due to comfort care status   #T2DM --recent A1c 8.7, poorly controlled.    #Anxiety and Depression Severe malnutrition  Hyponatremia has been ruled out, Hypernatremia ruled in    DVT prophylaxis: None:Comfort Care Code Status: DNR  Family Communication:  Level of care: Med-Surg Dispo:   The patient is from: home Anticipated d/c is to: to be determined Anticipated d/c date is: unknown   Subjective and  Interval History:  No change today.   Objective: Vitals:   07/25/23 2023 07/26/23 0728 07/26/23 0938 07/26/23 1947  BP: 119/72 125/76  124/81  Pulse: 83 95  94  Resp:  20 12   Temp: 98.9 F (37.2 C) 98 F (36.7 C)  (!) 97.5 F (36.4 C)  TempSrc:  Oral    SpO2: (!) 81% (!) 84%  91%  Weight:      Height:        Intake/Output Summary (Last 24 hours) at 07/26/2023 2004 Last data filed at 07/26/2023 1830 Gross per 24 hour  Intake --  Output 1700 ml  Net -1700 ml   Filed Weights   07/23/23 0432 07/24/23 0405 07/25/23 0500  Weight: 62.5 kg 62.4 kg 62.4 kg    Examination:   Constitutional: NAD, eyes half open, not responsive HEENT: conjunctivae and lids normal CV: No cyanosis.   RESP: normal respiratory effort, on RA Extremities: No effusions, edema in BLE SKIN: warm, dry   Data Reviewed: I have personally reviewed labs and imaging studies  Time spent: 35 minutes  Ellouise Haber, MD [facility] If 7PM-7AM, please contact night-coverage 07/26/2023, 8:04 PM

## 2023-07-26 NOTE — Plan of Care (Signed)
  Problem: Coping: Goal: Ability to adjust to condition or change in health will improve Outcome: Progressing   Problem: Pain Management: Goal: General experience of comfort will improve Outcome: Progressing   Problem: Coping: Goal: Level of anxiety will decrease Outcome: Not Applicable

## 2023-07-27 DIAGNOSIS — M6282 Rhabdomyolysis: Secondary | ICD-10-CM | POA: Diagnosis not present

## 2023-07-27 NOTE — Progress Notes (Signed)
 [facility] LIAISON NOTE   Received request from Faye McLaurin, RN, Transitions of Care Manager, yesterday evening for hospice InPatient Unit [facility]) evaluation. Saw patient this morning.  No family at the bedside.  Patient is lying in bed, eyes open and glazed over.  Respirations with periods of apnea.  Patient is sweating on head and upper extremities and very warm to touch. Discussed patient's current assessment with patient's RN, Neville and Dr. Awanda.  Patient is too unstable to transport at this time and is actively dying.  Will send in hospice IPU referral and place on hold pending how patient does over next 24 hours.  Hospital liaison team will continue to follow through final disposition.  Darrian Shoffner, TOC and hospital team updated on above findings and plan.  Please do not hesitate to call with any hospice related questions or concerns.  Thank you for the opportunity to participate in this patient's care.   Saddie HILARIO Na, RN Nurse Liaison [phone]

## 2023-07-27 NOTE — Plan of Care (Signed)
  Problem: Coping: Goal: Ability to adjust to condition or change in health will improve Outcome: Progressing   Problem: Education: Goal: Ability to describe self-care measures that may prevent or decrease complications (Diabetes Survival Skills Education) will improve Outcome: Not Progressing

## 2023-07-27 NOTE — Progress Notes (Signed)
  PROGRESS NOTE    Shane Perry  FMW:[id number] DOB: 03-19-1956 DOA: 07/21/2023 PCP: Gladystine Erminio CROME, MD  105A/105A-BB  LOS: 6 days   Brief hospital course: 68 y.o male  with significant PMHx of T2DM, HTN, HLD, history of ventricular bigeminy, kidney stone, Tongue SCC s/p Glossectomy, modified neck dissection on 03/14/2023 with tracheostomy and PEG tube placement,  s/p dental extractions of the maxillary right third molar (1), maxillary right second molar (2), maxillary left first molar (14), maxillary left second molar (15), and mandibular left first molar (19) L Radial forearm free flap (RFFF), BND, DHT placement, currently undergoing chemotherapy and radiation who presented to the ED with chief complaints of Altered mental status after being found down by family.   Assessment & Plan:  # comfort care status --made comfort care status by ICU attending after discussion with family on 07/24/23. --cont fentanyl  gtt --Hospice liaison evaluated pt and considered pt too unstable for transfer to hospice facility.  #Seizure Activity #Acute encephalopathy: post ictal Admit w/ ams, hypercalcemia and dehydration.  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  S/p course of abx by onc.    #HTN #HLD --d/c'ed anti-hypertensives and statin due to comfort care status   #T2DM --recent A1c 8.7, poorly controlled.    #Anxiety and Depression Severe malnutrition  Hyponatremia has been ruled out, Hypernatremia ruled in    DVT prophylaxis: None:Comfort Care Code Status: DNR  Family Communication: brother updated at bedside today Level of care: Med-Surg Dispo:   The patient is  from: home Anticipated d/c is to: likely hospital death Anticipated d/c date is: unknown   Subjective and Interval History:  Pt's eyes were open during rounds.  Per brother, pt seemed to acknowledge his presence.    Hospice liaison evaluated pt and considered pt too unstable for transfer to hospice facility.   Objective: Vitals:   07/26/23 0938 07/26/23 1947 07/27/23 0810 07/27/23 1921  BP:  124/81 (!) 142/88 128/82  Pulse:  94 (!) 106 (!) 112  Resp: 12  18   Temp:  (!) 97.5 F (36.4 C) 98.1 F (36.7 C) 98.7 F (37.1 C)  TempSrc:   Axillary   SpO2:  91% 90% (!) 88%  Weight:      Height:        Intake/Output Summary (Last 24 hours) at 07/27/2023 1940 Last data filed at 07/27/2023 1751 Gross per 24 hour  Intake --  Output 1400 ml  Net -1400 ml   Filed Weights   07/23/23 0432 07/24/23 0405 07/25/23 0500  Weight: 62.5 kg 62.4 kg 62.4 kg    Examination:   Constitutional: NAD, awake, eyes open HEENT: conjunctivae and lids normal CV: No cyanosis.   RESP: trach present   Data Reviewed: I have personally reviewed labs and imaging studies  Time spent: 35 minutes  Ellouise Haber, MD [facility] If 7PM-7AM, please contact night-coverage 07/27/2023, 7:40 PM

## 2023-07-28 ENCOUNTER — Inpatient Hospital Stay: Payer: 59 | Admitting: Hospice and Palliative Medicine

## 2023-07-28 DIAGNOSIS — T796XXA Traumatic ischemia of muscle, initial encounter: Secondary | ICD-10-CM

## 2023-07-28 DIAGNOSIS — Z515 Encounter for palliative care: Secondary | ICD-10-CM | POA: Diagnosis not present

## 2023-07-28 DIAGNOSIS — M6282 Rhabdomyolysis: Secondary | ICD-10-CM | POA: Diagnosis not present

## 2023-07-31 LAB — PTH-RELATED PEPTIDE: PTH-related peptide: <2 pmol/L

## 2023-08-01 ENCOUNTER — Ambulatory Visit: Payer: 59 | Admitting: Radiation Oncology

## 2023-08-01 DIAGNOSIS — Z9289 Personal history of other medical treatment: Secondary | ICD-10-CM | POA: Insufficient documentation

## 2023-08-01 DIAGNOSIS — J9601 Acute respiratory failure with hypoxia: Secondary | ICD-10-CM | POA: Insufficient documentation

## 2023-08-01 DIAGNOSIS — G9341 Metabolic encephalopathy: Secondary | ICD-10-CM | POA: Insufficient documentation

## 2023-08-01 DIAGNOSIS — E872 Acidosis, unspecified: Secondary | ICD-10-CM | POA: Insufficient documentation

## 2023-08-01 DIAGNOSIS — J690 Pneumonitis due to inhalation of food and vomit: Secondary | ICD-10-CM | POA: Insufficient documentation

## 2023-08-01 DIAGNOSIS — E870 Hyperosmolality and hypernatremia: Secondary | ICD-10-CM | POA: Insufficient documentation

## 2023-08-01 DIAGNOSIS — R569 Unspecified convulsions: Secondary | ICD-10-CM

## 2023-08-03 ENCOUNTER — Ambulatory Visit: Payer: 59 | Admitting: Hospice and Palliative Medicine

## 2023-08-03 ENCOUNTER — Other Ambulatory Visit: Payer: 59

## 2023-08-20 NOTE — Discharge Summary (Addendum)
 Death Summary  [name] FMW:[id number] DOB: 06-22-1956 DOA: 07-31-2023  PCP: Gladystine Erminio CROME, MD  Admit date: July 31, 2023 Date of Death: 08-07-2023 Time of Death: 20:49    History of present illness:  Shane Perry is a 68 y.o male  with significant PMHx of T2DM, HTN, Tongue SCC s/p Glossectomy, modified neck dissection on 03/14/2023 with tracheostomy and PEG tube placement,  s/p dental extractions of the maxillary right third molar (1), maxillary right second molar (2), maxillary left first molar (14), maxillary left second molar (15), and mandibular left first molar (19) L Radial forearm free flap (RFFF), BND, DHT placement, currently undergoing chemotherapy and radiation who presented to the ED with chief complaints of Altered mental status after being found down by family.    # comfort care status --made comfort care status by ICU attending after discussion with family on 07/24/23. --was maintained on fentanyl  gtt --Hospice liaison evaluated pt and considered pt too unstable for transfer to hospice facility.   #Seizure Activity #Acute encephalopathy: post ictal Admit w/ ams, hypercalcemia and dehydration.  -Repeat CTH negative -Neuro consult cancelled due to comfort measures   #Acute Hypoxic Hypercapneic Respiratory Failure: Requiring vent support via trach #Aspiration Pneumonia --off vent after initiating comfort measures   #Lactic Acidosis   #Acute Rhabdomyolysis #Hypercalcemia in the setting of malignancy   #Tongue SCC  #Rash suspected 2/2 radiation.  S/p course of abx by onc.    #HTN #HLD --d/c'ed anti-hypertensives and statin due to comfort care status   #T2DM --recent A1c 8.7, poorly controlled.    #Anxiety and Depression Severe malnutrition  Hyponatremia has been ruled out, Hypernatremia ruled in    Discharge Diagnoses:  Principal Problem:   Rhabdomyolysis Active Problems:   Hyponatremia   Hypercalcemia   Type 2 diabetes mellitus with  peripheral neuropathy (HCC)   Hypertension associated with diabetes (HCC)   Panlobular emphysema (HCC)   Tongue cancer (HCC)   Depression   GERD without esophagitis   Feeding by G-tube (HCC)   Tracheostomy dependence (HCC)   Palliative care encounter   Protein-calorie malnutrition, severe    The results of significant diagnostics from this hospitalization (including imaging, microbiology, ancillary and laboratory) are listed below for reference.    Significant Diagnostic Studies: CT CHEST ABDOMEN PELVIS W CONTRAST Result Date: 07/22/2023 CLINICAL DATA:  Sepsis EXAM: CT CHEST, ABDOMEN, AND PELVIS WITH CONTRAST TECHNIQUE: Multidetector CT imaging of the chest, abdomen and pelvis was performed following the standard protocol during bolus administration of intravenous contrast. RADIATION DOSE REDUCTION: This exam was performed according to the departmental dose-optimization program which includes automated exposure control, adjustment of the mA and/or kV according to patient size and/or use of iterative reconstruction technique. CONTRAST:  OMNIPAQUE  IOHEXOL  300 MG/ML  SOLN COMPARISON:  PET-CT 04/06/2023 FINDINGS: CT CHEST FINDINGS Cardiovascular: No significant vascular findings. Normal heart size. No pericardial effusion. Right chest port catheter tip ends in the distal SVC. Mediastinum/Nodes: There are no enlarged mediastinal or hilar lymph nodes. Tracheostomy is in place. Visualized thyroid gland is within normal limits. There are multiple peripherally enhancing lobulated low-attenuation areas in the submental region. The largest is in the left submental region measuring 5.5 x 2.0 cm image 3/6. Low right jugular chain hypodense lesion measures 1.7 x 1.5 cm image 3/13. This appears new from prior. Lungs/Pleura: Mild emphysematous changes are present. There is new airspace consolidation with surrounding tree-in-bud opacities in the left lower lobe. Secretions are seen in the left mainstem bronchus  and occluding  left lower lobe bronchi. There also secretions minimally in right lower lobe bronchi with scattered minimal tree-in-bud and ground-glass opacities in the right lower lobe. There is no pleural effusion or pneumothorax. Musculoskeletal: No chest wall mass or suspicious bone lesions identified. CT ABDOMEN PELVIS FINDINGS Hepatobiliary: There are proximally five new hypodense hepatic lesions measuring up to 2.3 cm in diameter. These are suspicious for metastatic disease. Gallstones are present. No biliary ductal dilatation. Pancreas: Unremarkable. No pancreatic ductal dilatation or surrounding inflammatory changes. Spleen: Normal in size without focal abnormality. Adrenals/Urinary Tract: There is a single punctate calculus in the lower pole the right kidney. There are additional calculi in the mid and lower pole of the left kidney measuring up to 15 mm. These are similar to prior. There is no hydronephrosis. Left renal cysts are present measuring up to 3.2 cm. The adrenal glands are within normal limits. There is trabeculated bladder wall. Stomach/Bowel: No evidence of bowel wall thickening, distention, or inflammatory changes. There is a large amount of stool throughout the colon. The appendix is not visualized. There is a percutaneous gastrostomy tube in place. Vascular/Lymphatic: Aortic atherosclerosis. No enlarged abdominal or pelvic lymph nodes. Reproductive: Prostate gland is enlarged and heterogeneous. Other: No abdominal wall hernia or abnormality. No abdominopelvic ascites. Musculoskeletal: No acute or significant osseous findings. IMPRESSION: 1. New airspace consolidation with surrounding tree-in-bud opacities in the left lower lobe worrisome for pneumonia. Secretions are seen in the left mainstem bronchus and occluding left lower lobe bronchi can be seen in the setting of aspiration. 2. Minimal secretions in the right lower lobe bronchi with scattered minimal tree-in-bud and ground-glass  opacities in the right lower lobe. 3. New hypodense hepatic lesions suspicious for metastatic disease. 4. Multiple peripherally enhancing low-attenuation areas in the submental region worrisome for disease recurrence or metastatic disease. Abscess not excluded. 5. New hypodense lesion in the low right jugular chain worrisome for metastatic disease. 6. Nonobstructing bilateral renal calculi. 7. Cholelithiasis. 8. Constipation. 9. Left Bosniak I benign renal cyst measuring 3.2 cm. No follow-up imaging is recommended. JACR 2018 Feb; 264-273, Management of the Incidental Renal Mass on CT, [employer] 2021; 814-848, Bosniak Classification of Cystic Renal Masses, Version 2019. Aortic Atherosclerosis (ICD10-I70.0) and Emphysema (ICD10-J43.9). Electronically Signed   By: Greig Pique M.D.   On: 07/22/2023 23:08   CT HEAD WO CONTRAST ( ) Result Date: 07/22/2023 CLINICAL DATA:  Stroke, hemorrhagic.  Seizure like activity. EXAM: CT HEAD WITHOUT CONTRAST TECHNIQUE: Contiguous axial images were obtained from the base of the skull through the vertex without intravenous contrast. RADIATION DOSE REDUCTION: This exam was performed according to the departmental dose-optimization program which includes automated exposure control, adjustment of the mA and/or kV according to patient size and/or use of iterative reconstruction technique. COMPARISON:  CT head 07/21/2023. FINDINGS: Brain: No evidence of acute large vascular territory infarction, hemorrhage, hydrocephalus, extra-axial collection or mass lesion/mass effect. Vascular: No hyperdense vessel. Skull: No acute fracture. Sinuses/Orbits: Clear sinuses.  No acute orbital findings. Other: No mastoid effusions. IMPRESSION: No evidence of acute intracranial abnormality. Electronically Signed   By: Gilmore GORMAN Molt M.D.   On: 07/22/2023 23:02   DG Swallowing Func-Speech Pathology Result Date: 07/22/2023 Table formatting from the original result was not included. Modified Barium  Swallow Study Patient Details Name: Alcides L Ullom MRN: [id number] Date of Birth: 1955/12/26 Today's Date: 07/22/2023 HPI/PMH: HPI: Najeh L Harding is a 68 y.o. African-American male with medical history significant for type diabetes mellitus, peripheral neuropathy, hypertension, dyslipidemia, urolithiasis and lung  cancer status postchemotherapy and radiotherapy as well as glossectomy, and modified neck dissection on 03/14/2023 with tracheostomy and PEG tube placement who cleared show, who presented to the emergency room with acute onset of altered mental status with confusion and disorientation on 07/21/2023. Of note, pt was taken to ED at [facility] by [facility] on 07/19/2023  as pt was found driving on the wrong side [place] [address]. MRI revealed 07/19/2023 Mild generalized cerebral atrophy. Multifocal T2 FLAIR hyperintense signal abnormality within the cerebral white matter, nonspecific but compatible with mild chronic small vessel ischemic disease. Punctate chronic microhemorrhage within the inferior left parietal lobe. 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 Two small chronic infarcts within the left cerebellar hemisphere. There is no acute infarct. No evidence of an intracranial mass. No extra-axial fluid collection. No midline shift. Clinical Impression: Clinical Impression: Pt presented to fluro room with incessant talking and continued decreased speech intelligibility (<25%). This clinical research associate made attempts to redirect pt to yes/no questions and to redirect pt to instructions regarding swallow study. Pt was agreeable to study and was transferred by staff into radiology chair. He continued talking and pointing to his knee and different fingers. This writer was not able to assist pt with communication.     Pt was agreeable to consuming nectar thick liquid via spoon. While holding this bolus in the anterior portion of his mouth, he continued talking for > 10 minutes with no production of swallow response.  After extended period of time, pt was able to be redirected to consuming thin liquids via spoon. After 1 spoonful size bolus pt stated need another one. Second tsp size thin liquid bolus was administered with bolus falling over back of tongue and silent aspiration observed. Nectar thick liquid bolus had to be removed manually by this clinical research associate with washcloth. At this time, pt is at increased risk of aspiration and would benefit from NPO status with use of PEG for nutrition. Should pt wish to return to PO intake, would recommend follow up Outpatient Modified Barium Swallow Study. Factors that may increase risk of adverse event in presence of aspiration Noe & Lianne 2021): Factors that may increase risk of adverse event in presence of aspiration Noe & Lianne 2021): Poor general health and/or compromised immunity; Respiratory or GI disease; Reduced cognitive function; Inadequate oral hygiene; Reduced saliva; Aspiration of thick, dense, and/or acidic materials; Frequent aspiration of large volumes; Presence of tubes (ETT, trach, NG, etc.) Recommendations/Plan: Swallowing Evaluation Recommendations Swallowing Evaluation Recommendations Recommendations: NPO; Alternative means of nutrition - G Tube Medication Administration: Via alternative means Oral care recommendations: Oral care QID (4x/day) Treatment Plan Treatment Plan Treatment recommendations: No treatment recommended at this time Follow-up recommendations: Follow physicians's recommendations for discharge plan and follow up therapies Functional status assessment: Patient has had a recent decline in their functional status and/or demonstrates limited ability to make significant improvements in function in a reasonable and predictable amount of time. Recommendations Recommendations for follow up therapy are one component of a multi-disciplinary discharge planning process, led by the attending physician.  Recommendations may be updated based on patient  status, additional functional criteria and insurance authorization. Assessment: Orofacial Exam: Orofacial Exam Oral Cavity: Oral Hygiene: Dried secretions Oral Cavity - Dentition: Poor condition; Missing dentition Orofacial Anatomy: WFL Oral Motor/Sensory Function: -- (s/p glossectomy) Anatomy: Anatomy: WFL Boluses Administered: Boluses Administered Boluses Administered: Thin liquids (Level 0); Mildly thick liquids (Level 2, nectar thick)  Oral Impairment Domain: Oral Impairment Domain Lip Closure: Escape beyond mid-chin; Escape  progressing to mid-chin Tongue control during bolus hold: Posterior escape of greater than half of bolus Bolus transport/lingual motion: Minimal-no tongue motion Oral residue: Complete oral clearance Location of oral residue : Floor of mouth; Tongue; Lateral sulci Initiation of pharyngeal swallow : No visible initiation at any location; Pyriform sinuses  Pharyngeal Impairment Domain: Pharyngeal Impairment Domain Soft palate elevation: No bolus between soft palate (SP)/pharyngeal wall (PW) Laryngeal elevation: No superior movement of thyroid cartilage Anterior hyoid excursion: No anterior movement Epiglottic movement: No inversion Laryngeal vestibule closure: None, wide column air/contrast in laryngeal vestibule Pharyngeal stripping wave : Present - diminished Pharyngoesophageal segment opening: Minimal distention/minimal duration, marked obstruction of flow Tongue base retraction: No visible posterior motion of tongue base Pharyngeal residue: Majority of contrast within or on pharyngeal structures; Minimal to no pharyngeal clearance Location of pharyngeal residue: Valleculae; Pharyngeal wall; Aryepiglottic folds; Pyriform sinuses  Esophageal Impairment Domain: Esophageal Impairment Domain Esophageal clearance upright position: Complete clearance, esophageal coating Pill: No data recorded Penetration/Aspiration Scale Score: Penetration/Aspiration Scale Score 8.  Material enters airway, passes  BELOW cords without attempt by patient to eject out (silent aspiration) : Thin liquids (Level 0) Compensatory Strategies: No data recorded  General Information: Caregiver present: No  Diet Prior to this Study: NPO   Temperature : Normal   Respiratory Status: WFL   Supplemental O2: None (Room air) (pt with capped trach)   History of Recent Intubation: No  Behavior/Cognition: Alert; Cooperative; Pleasant mood; Distractible Self-Feeding Abilities: Able to self-feed Baseline vocal quality/speech: -- (hoarse) Volitional Cough: Unable to elicit Volitional Swallow: Unable to elicit Exam Limitations: -- (excessiver verbalization, difficult to redirect to task) Goal Planning: Prognosis for improved oropharyngeal function: Guarded Barriers to Reach Goals: Cognitive deficits; Time post onset; Severity of deficits; Behavior; Medication; Overall medical prognosis (radical neck dissection and glossectomy) No data recorded Patient/Family Stated Goal: none stated Consulted and agree with results and recommendations: Patient; Physician; Nurse (Josh Borders, [facility]) Pain: Pain Assessment Pain Assessment: No/denies pain Breathing: 0 Negative Vocalization: 0 Facial Expression: 0 Body Language: 0 Consolability: 0 PAINAD Score: 0 End of Session: Start Time:SLP Start Time (ACUTE ONLY): 1137 Stop Time: SLP Stop Time (ACUTE ONLY): 1158 Time Calculation:SLP Time Calculation (min) (ACUTE ONLY): 21 min Charges: SLP Evaluations $ SLP Speech Visit: 1 Visit SLP Evaluations $BSS Swallow: 1 Procedure $MBS Swallow: 1 Procedure SLP visit diagnosis: SLP Visit Diagnosis: Dysphagia, oropharyngeal phase (R13.12) Past Medical History: Past Medical History: Diagnosis Date  Cancer (HCC)   Diabetes mellitus without complication (HCC)   Diabetic polyneuropathy associated with type 2 diabetes mellitus (HCC)   History of kidney stones   Hyperlipidemia   Hypertension   Kidney stones   Onychomycosis   Panlobular emphysema (HCC)   Squamous cell  carcinoma, tongue border (HCC)   Tongue cancer (HCC)   Weight loss  Past Surgical History: Past Surgical History: Procedure Laterality Date  COLONOSCOPY    COLONOSCOPY WITH PROPOFOL  N/A 04/26/2023  Procedure: COLONOSCOPY WITH PROPOFOL ;  Surgeon: Jinny Carmine, MD;  Location: [facility] ENDOSCOPY;  Service: Endoscopy;  Laterality: N/A;  CYST REMOVAL NECK    GASTROSTOMY N/A 05/10/2023  Procedure: INSERTION OF GASTROSTOMY TUBE, open;  Surgeon: Desiderio Schanz, MD;  Location: [facility] ORS;  Service: General;  Laterality: N/A;  IR IMAGING GUIDED PORT INSERTION  04/14/2023  left arm muscle removal for tongue    partial tongue removal  03/14/2023  PORTA CATH INSERTION Right   tongue cancer    TRACHEOSTOMY  03/14/2023 Happi Overton 07/22/2023, 3:00 PM  DG Chest Portable 1 View Result Date: 07/21/2023 CLINICAL DATA:  Recent fall EXAM: PORTABLE CHEST 1 VIEW COMPARISON:  None FINDINGS: Cardiac shadow is within normal limits. Right chest wall port and tracheostomy tube are noted in satisfactory position. Lungs are clear bilaterally. Multiple skin folds are noted. No bony abnormality is seen. IMPRESSION: No active disease. Electronically Signed   By: Oneil Devonshire M.D.   On: 07/21/2023 20:48   CT Head Wo Contrast Result Date: 07/21/2023 CLINICAL DATA:  Head trauma, minor (Age >= 65y); Neck trauma (Age >= 65y). Unwitnessed fall. Altered mental status. History of tongue cancer. EXAM: CT HEAD WITHOUT CONTRAST CT CERVICAL SPINE WITHOUT CONTRAST TECHNIQUE: Multidetector CT imaging of the head and cervical spine was performed following the standard protocol without intravenous contrast. Multiplanar CT image reconstructions of the cervical spine were also generated. RADIATION DOSE REDUCTION: This exam was performed according to the departmental dose-optimization program which includes automated exposure control, adjustment of the mA and/or kV according to patient size and/or use of iterative reconstruction technique. COMPARISON:  Head MRI  07/19/2023.  PET-04/06/2023. FINDINGS: CT HEAD FINDINGS Brain: There is no evidence of an acute infarct, intracranial hemorrhage, mass, midline shift, or extra-axial fluid collection. There is mild cerebral atrophy. 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 Vascular: Calcified atherosclerosis at the skull base. No hyperdense vessel. Skull: No acute fracture or suspicious osseous lesion. Sinuses/Orbits: Small mucous retention cyst in the left maxillary sinus. Clear mastoid air cells. Unremarkable orbits. Other: None. CT CERVICAL SPINE FINDINGS Alignment: Normal. Skull base and vertebrae: No acute fracture or suspicious osseous lesion. Soft tissues and spinal canal: No prevertebral fluid or swelling. No visible canal hematoma. Disc levels: Cervical spondylosis and facet arthrosis. Moderate spinal stenosis and severe left greater than right neural foraminal stenosis at C5-6. Moderate left neural foraminal stenosis at C3-4 and C4-5. Upper chest: Emphysema. Other: Partially visualized tracheostomy. Partially visualized postsurgical changes in the left neck. 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 IMPRESSION: 1. No evidence of acute intracranial abnormality or acute cervical spine fracture. 2. Known cervical nodal metastatic disease. Electronically Signed   By: Dasie Hamburg M.D.   On: 07/21/2023 20:25   CT Cervical Spine Wo Contrast Result Date: 07/21/2023 CLINICAL DATA:  Head trauma, minor (Age >= 65y); Neck trauma (Age >= 65y). Unwitnessed fall. Altered mental status. History of tongue cancer. EXAM: CT HEAD WITHOUT CONTRAST CT CERVICAL SPINE WITHOUT CONTRAST TECHNIQUE: Multidetector CT imaging of the head and cervical spine was performed following the standard protocol without intravenous contrast. Multiplanar CT image reconstructions of the cervical spine were also generated. RADIATION DOSE REDUCTION: This exam was performed according to the departmental  dose-optimization program which includes automated exposure control, adjustment of the mA and/or kV according to patient size and/or use of iterative reconstruction technique. COMPARISON:  Head MRI 07/19/2023.  PET-04/06/2023. FINDINGS: CT HEAD FINDINGS Brain: There is no evidence of an acute infarct, intracranial hemorrhage, mass, midline shift, or extra-axial fluid collection. There is mild cerebral atrophy. 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 Vascular: Calcified atherosclerosis at the skull base. No hyperdense vessel. Skull: No acute fracture or suspicious osseous lesion. Sinuses/Orbits: Small mucous retention cyst in the left maxillary sinus. Clear mastoid air cells. Unremarkable orbits. Other: None. CT CERVICAL SPINE FINDINGS Alignment: Normal. Skull base and vertebrae: No acute fracture or suspicious osseous lesion. Soft tissues and spinal canal: No prevertebral fluid or swelling. No visible canal hematoma. Disc levels: Cervical spondylosis  and facet arthrosis. Moderate spinal stenosis and severe left greater than right neural foraminal stenosis at C5-6. Moderate left neural foraminal stenosis at C3-4 and C4-5. Upper chest: Emphysema. Other: Partially visualized tracheostomy. Partially visualized postsurgical changes in the left neck. 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 IMPRESSION: 1. No evidence of acute intracranial abnormality or acute cervical spine fracture. 2. Known cervical nodal metastatic disease. Electronically Signed   By: Dasie Hamburg M.D.   On: 07/21/2023 20:25   MR BRAIN WO CONTRAST Result Date: 07/19/2023 CLINICAL DATA:  Provided history: Stroke, follow-up. EXAM: MRI HEAD WITHOUT CONTRAST TECHNIQUE: Multiplanar, multiecho pulse sequences of the brain and surrounding structures were obtained without intravenous contrast. COMPARISON:  Head CT 07/19/2023. FINDINGS: Brain: Mild generalized cerebral atrophy. Multifocal T2 FLAIR  hyperintense signal abnormality within the cerebral white matter, nonspecific but compatible with mild chronic small vessel ischemic disease. Punctate chronic microhemorrhage within the inferior left parietal lobe. 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 Two small chronic infarcts within the left cerebellar hemisphere. There is no acute infarct. No evidence of an intracranial mass. No extra-axial fluid collection. No midline shift. Vascular: Maintained flow voids within the proximal large arterial vessels. Skull and upper cervical spine: No focal worrisome marrow lesion. Sinuses/Orbits: No mass or acute finding within the imaged orbits. Small mucous retention cyst within the left maxillary sinus. IMPRESSION: 1.  No evidence of an acute intracranial abnormality. 2. 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 3. Chronic microhemorrhage within the left parietal lobe. 4. Small left maxillary sinus mucous retention cyst.  Yes Electronically Signed   By: Rockey Childs D.O.   On: 07/19/2023 11:26   CT Head Wo Contrast Result Date: 07/19/2023 CLINICAL DATA:  Altered mental status with unknown cause. EXAM: CT HEAD WITHOUT CONTRAST TECHNIQUE: Contiguous axial images were obtained from the base of the skull through the vertex without intravenous contrast. RADIATION DOSE REDUCTION: This exam was performed according to the departmental dose-optimization program which includes automated exposure control, adjustment of the mA and/or kV according to patient size and/or use of iterative reconstruction technique. COMPARISON:  None Available. FINDINGS: Brain: No evidence of acute infarction, hemorrhage, hydrocephalus, extra-axial collection or mass lesion/mass effect. Small, discrete and chronic appearing lacune is in the left cerebellum. Mild generalized cerebral volume loss. Vascular: No hyperdense vessel or unexpected calcification. Skull: Normal. Negative for  fracture or focal lesion. Sinuses/Orbits: No acute finding IMPRESSION: No acute or reversible finding. Electronically Signed   By: Dorn Roulette M.D.   On: 07/19/2023 08:16    Microbiology: Recent Results (from the past 240 hours)  Resp panel by RT-PCR (RSV, Flu A&B, Covid) Anterior Nasal Swab     Status: None   Collection Time: 07/21/23  7:08 PM   Specimen: Anterior Nasal Swab  Result Value Ref Range Status   SARS Coronavirus 2 by RT PCR NEGATIVE NEGATIVE Final    Comment: (NOTE) SARS-CoV-2 target nucleic acids are NOT DETECTED.  The SARS-CoV-2 RNA is generally detectable in upper respiratory specimens during the acute phase of infection. The lowest concentration of SARS-CoV-2 viral copies this assay can detect is 138 copies/mL. A negative result does not preclude SARS-Cov-2 infection and should not be used as the sole basis for treatment or other patient management decisions. A negative result may occur with  improper specimen collection/handling, submission of specimen other than nasopharyngeal swab, presence of viral mutation(s) within the areas targeted by this assay,  and inadequate number of viral copies(<138 copies/mL). A negative result must be combined with clinical observations, patient history, and epidemiological information. The expected result is Negative.  Fact Sheet for Patients:  bloggercourse.com  Fact Sheet for Healthcare Providers:  seriousbroker.it  This test is no t yet approved or cleared by the [place]  FDA and  has been authorized for detection and/or diagnosis of SARS-CoV-2 by FDA under an Emergency Use Authorization (EUA). This EUA will remain  in effect (meaning this test can be used) for the duration of the COVID-19 declaration under Section 564(b)(1) of the Act, 21 U.S.C.section 360bbb-3(b)(1), unless the authorization is terminated  or revoked sooner.       Influenza A by PCR NEGATIVE  NEGATIVE Final   Influenza B by PCR NEGATIVE NEGATIVE Final    Comment: (NOTE) The Xpert Xpress SARS-CoV-2/FLU/RSV plus assay is intended as an aid in the diagnosis of influenza from Nasopharyngeal swab specimens and should not be used as a sole basis for treatment. Nasal washings and aspirates are unacceptable for Xpert Xpress SARS-CoV-2/FLU/RSV testing.  Fact Sheet for Patients: bloggercourse.com  Fact Sheet for Healthcare Providers: seriousbroker.it  This test is not yet approved or cleared by the [place]  FDA and has been authorized for detection and/or diagnosis of SARS-CoV-2 by FDA under an Emergency Use Authorization (EUA). This EUA will remain in effect (meaning this test can be used) for the duration of the COVID-19 declaration under Section 564(b)(1) of the Act, 21 U.S.C. section 360bbb-3(b)(1), unless the authorization is terminated or revoked.     Resp Syncytial Virus by PCR NEGATIVE NEGATIVE Final    Comment: (NOTE) Fact Sheet for Patients: bloggercourse.com  Fact Sheet for Healthcare Providers: seriousbroker.it  This test is not yet approved or cleared by the [place]  FDA and has been authorized for detection and/or diagnosis of SARS-CoV-2 by FDA under an Emergency Use Authorization (EUA). This EUA will remain in effect (meaning this test can be used) for the duration of the COVID-19 declaration under Section 564(b)(1) of the Act, 21 U.S.C. section 360bbb-3(b)(1), unless the authorization is terminated or revoked.  Performed at [facility], [address] [place], [place] [address]   MRSA Next Gen by PCR, Nasal     Status: None   Collection Time: 07/22/23  9:33 PM   Specimen: Nasal Mucosa; Nasal Swab  Result Value Ref Range Status   MRSA by PCR Next Gen NOT DETECTED NOT DETECTED Final    Comment: (NOTE) The GeneXpert MRSA Assay (FDA  approved for NASAL specimens only), is one component of a comprehensive MRSA colonization surveillance program. It is not intended to diagnose MRSA infection nor to guide or monitor treatment for MRSA infections. Test performance is not FDA approved in patients less than 16 years old. Performed at [facility], [address] [place], [place] [address]      Labs: Basic Metabolic Panel: Recent Labs  Lab 07/22/23 0410 07/22/23 1749 07/22/23 2116 07/23/23 0309 07/23/23 0322 07/23/23 1643 07/24/23 0521  NA 141  --  146*  --  146*  --  145  K 3.7  --  3.9  --  3.3*  --  4.0  CL 100  --  103  --  107  --  106  CO2 27  --  29  --  31  --  30  GLUCOSE 139*  --  159*  --  156*  --  182*  BUN 45*  --  36*  --  35*  --  45*  CREATININE 0.68  --  0.76  --  0.65  --  0.89  CALCIUM  11.6*  --  11.1*  --  11.0*  --  10.3  MG  --  1.8  --  2.3 2.2 2.0 2.1  PHOS  --  2.2*  --  1.6*  --  1.8* 1.9*   Liver Function Tests: Recent Labs  Lab 07/22/23 2116 07/23/23 0322 07/24/23 0521  AST 44* 37 22  ALT 28 26 22   ALKPHOS 76 71 69  BILITOT 0.9 0.9 0.5  PROT 6.1* 5.7* 5.6*  ALBUMIN 3.2* 2.9* 2.7*   No results for input(s): LIPASE, AMYLASE in the last 168 hours. Recent Labs  Lab 07/23/23 0021  AMMONIA 35   CBC: Recent Labs  Lab 07/22/23 0410 07/22/23 2116 07/23/23 0322 07/24/23 0521  WBC 10.6* 9.3 6.7 4.7  NEUTROABS  --  8.6*  --   --   HGB 12.6* 12.6* 11.2* 11.4*  HCT 38.0* 38.3* 33.4* 34.8*  MCV 93.4 94.6 91.3 96.1  PLT 180 161 158 132*   Cardiac Enzymes: Recent Labs  Lab 07/22/23 0410 07/23/23 0309 07/24/23 0521  CKTOTAL 541* 126 64   D-Dimer No results for input(s): DDIMER in the last 72 hours. BNP: Invalid input(s): POCBNP CBG: Recent Labs  Lab 07/23/23 1554 07/23/23 1947 07/23/23 2339 07/24/23 0400 07/24/23 0745  GLUCAP 158* 152* 173* 169* 187*   Anemia work up No results for input(s): VITAMINB12, FOLATE, FERRITIN, TIBC,  IRON, RETICCTPCT in the last 72 hours. Urinalysis    Component Value Date/Time   COLORURINE YELLOW 07/19/2023 0745   APPEARANCEUR CLEAR 07/19/2023 0745   LABSPEC 1.012 07/19/2023 0745   PHURINE 6.0 07/19/2023 0745   GLUCOSEU NEGATIVE 07/19/2023 0745   HGBUR SMALL (A) 07/19/2023 0745   BILIRUBINUR NEGATIVE 07/19/2023 0745   KETONESUR NEGATIVE 07/19/2023 0745   PROTEINUR 30 (A) 07/19/2023 0745   NITRITE NEGATIVE 07/19/2023 0745   LEUKOCYTESUR NEGATIVE 07/19/2023 0745   Sepsis Labs Recent Labs  Lab 07/22/23 0410 07/22/23 2116 07/23/23 0322 07/24/23 0521  WBC 10.6* 9.3 6.7 4.7      SIGNED:  Ellouise Haber, MD  [facility] 22-Aug-2023, 9:32 PM Pager   If 7PM-7AM, please contact night-coverage [email or web address] Password [email or web address]

## 2023-08-20 NOTE — Progress Notes (Signed)
 Palliative Medicine [facility] at [facility] [phone] [phone]   Name: Shane Perry Date: Aug 21, 2023 MRN: [id number]  DOB: [birth year]  Patient Care Team: Gladystine Erminio CROME, MD as PCP - General (Family Medicine) Perla Evalene PARAS, MD as PCP - Cardiology (Cardiology) Lenn Aran, MD as Consulting Physician (Radiation Oncology) Melanee Annah BROCKS, MD as Consulting Physician (Oncology)    REASON FOR CONSULTATION: Shane Perry is a 68 y.o. 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  Patient status post 6 cycles of cisplatin  with concurrent radiation.  He was admitted to hospital on 07/21/2023 with altered mental status after being found down at home by family.  Labs consistent with rhabdo and hypercalcemia.  Palliative care consulted to address goals.    CODE STATUS: DNR  PAST MEDICAL HISTORY: Past Medical History:  Diagnosis Date   Cancer (HCC)    Diabetes mellitus without complication (HCC)    Diabetic polyneuropathy associated with type 2 diabetes mellitus (HCC)    History of kidney stones    Hyperlipidemia    Hypertension    Kidney stones    Onychomycosis    Panlobular emphysema (HCC)    Squamous cell carcinoma, tongue border (HCC)    Tongue cancer (HCC)    Weight loss     PAST SURGICAL HISTORY:  Past Surgical History:  Procedure Laterality Date   COLONOSCOPY     COLONOSCOPY WITH PROPOFOL  N/A 04/26/2023   Procedure: COLONOSCOPY WITH PROPOFOL ;  Surgeon: Jinny Carmine, MD;  Location: [facility] ENDOSCOPY;  Service: Endoscopy;  Laterality: N/A;   CYST REMOVAL NECK     GASTROSTOMY N/A 05/10/2023   Procedure: INSERTION OF GASTROSTOMY TUBE, open;  Surgeon: Desiderio Schanz, MD;  Location: [facility] ORS;  Service: General;  Laterality: N/A;   IR IMAGING GUIDED PORT INSERTION  04/14/2023   left arm  muscle removal for tongue     partial tongue removal  03/14/2023   PORTA CATH INSERTION Right    tongue cancer     TRACHEOSTOMY  03/14/2023    HEMATOLOGY/ONCOLOGY HISTORY:  Oncology History  Tongue cancer (HCC)  02/01/2023 Initial Diagnosis   Tongue cancer (HCC)   02/01/2023 Cancer Staging   Staging form: Oral Cavity, AJCC 8th Edition - Clinical stage from 02/01/2023: Stage II (cT2, cN0, cM0) - Signed by Melanee Annah BROCKS, MD on 02/01/2023 Histologic grade (G): G2 Histologic grading system: 3 grade system   03/29/2023 Cancer Staging   Staging form: Oral Cavity, AJCC 8th Edition - Pathologic stage from 03/29/2023: Stage IVB (pT4a, pN3b, cM0) - Signed by Melanee Annah BROCKS, MD on 03/29/2023   04/27/2023 -  Chemotherapy   Patient is on Treatment Plan : HEAD/NECK Cisplatin  (40) q7d       ALLERGIES:  is allergic to statins, varenicline, and lisinopril.  MEDICATIONS:  Current Facility-Administered Medications  Medication Dose Route Frequency Provider Last Rate Last Admin   acetaminophen  (TYLENOL ) tablet 650 mg  650 mg Oral Q6H PRN Mansy, Jan A, MD       Or   acetaminophen  (TYLENOL ) suppository 650 mg  650 mg Rectal Q6H PRN Mansy, Jan A, MD       fentaNYL  in NS 250mL (10mcg/ml) infusion-PREMIX  25-200 mcg/hr Intravenous Continuous Ouma, Elizabeth Achieng, NP 7.5 mL/hr at 07/27/23 1151 75 mcg/hr at 07/27/23 1151   LORazepam  (ATIVAN ) injection 2-4 mg  2-4 mg Intravenous  Q4H PRN Nelson, Dana G, NP   2 mg at 07/26/23 1809   ondansetron  (ZOFRAN ) tablet 4 mg  4 mg Oral Q6H PRN Mansy, Jan A, MD       Or   ondansetron  (ZOFRAN ) injection 4 mg  4 mg Intravenous Q6H PRN Mansy, Jan A, MD       oxyCODONE  (ROXICODONE ) 5 MG/5ML solution 5 mg  5 mg Per Tube Q6H PRN Windle Ling A, RPH        VITAL SIGNS: BP 94/74 (BP Location: Right Arm)   Pulse (!) 128   Temp 99.2 F (37.3 C)   Resp (!) 22   Ht 6' 1 (1.854 m)   Wt 137 lb 9.1 oz (62.4 kg)   SpO2 (!) 82%   BMI 18.15 kg/m  Filed Weights    07/23/23 0432 07/24/23 0405 07/25/23 0500  Weight: 137 lb 12.6 oz (62.5 kg) 137 lb 9.1 oz (62.4 kg) 137 lb 9.1 oz (62.4 kg)    Estimated body mass index is 18.15 kg/m as calculated from the following:   Height as of this encounter: 6' 1 (1.854 m).   Weight as of this encounter: 137 lb 9.1 oz (62.4 kg).  LABS: CBC:    Component Value Date/Time   WBC 4.7 07/24/2023 0521   HGB 11.4 (L) 07/24/2023 0521   HGB 13.0 06/22/2023 0850   HCT 34.8 (L) 07/24/2023 0521   PLT 132 (L) 07/24/2023 0521   PLT 286 06/22/2023 0850   MCV 96.1 07/24/2023 0521   NEUTROABS 8.6 (H) 07/22/2023 2116   LYMPHSABS 0.1 (L) 07/22/2023 2116   MONOABS 0.6 07/22/2023 2116   EOSABS 0.0 07/22/2023 2116   BASOSABS 0.0 07/22/2023 2116   Comprehensive Metabolic Panel:    Component Value Date/Time   NA 145 07/24/2023 0521   K 4.0 07/24/2023 0521   CL 106 07/24/2023 0521   CO2 30 07/24/2023 0521   BUN 45 (H) 07/24/2023 0521   CREATININE 0.89 07/24/2023 0521   CREATININE 0.66 06/29/2023 0832   GLUCOSE 182 (H) 07/24/2023 0521   CALCIUM  10.3 07/24/2023 0521   AST 22 07/24/2023 0521   ALT 22 07/24/2023 0521   ALKPHOS 69 07/24/2023 0521   BILITOT 0.5 07/24/2023 0521   PROT 5.6 (L) 07/24/2023 0521   ALBUMIN 2.7 (L) 07/24/2023 0521    RADIOGRAPHIC STUDIES: CT CHEST ABDOMEN PELVIS W CONTRAST Result Date: 07/22/2023 CLINICAL DATA:  Sepsis EXAM: CT CHEST, ABDOMEN, AND PELVIS WITH CONTRAST TECHNIQUE: Multidetector CT imaging of the chest, abdomen and pelvis was performed following the standard protocol during bolus administration of intravenous contrast. RADIATION DOSE REDUCTION: This exam was performed according to the departmental dose-optimization program which includes automated exposure control, adjustment of the mA and/or kV according to patient size and/or use of iterative reconstruction technique. CONTRAST:  OMNIPAQUE  IOHEXOL  300 MG/ML  SOLN COMPARISON:  PET-CT 04/06/2023 FINDINGS: CT CHEST FINDINGS  Cardiovascular: No significant vascular findings. Normal heart size. No pericardial effusion. Right chest port catheter tip ends in the distal SVC. Mediastinum/Nodes: There are no enlarged mediastinal or hilar lymph nodes. Tracheostomy is in place. Visualized thyroid gland is within normal limits. There are multiple peripherally enhancing lobulated low-attenuation areas in the submental region. The largest is in the left submental region measuring 5.5 x 2.0 cm image 3/6. Low right jugular chain hypodense lesion measures 1.7 x 1.5 cm image 3/13. This appears new from prior. Lungs/Pleura: Mild emphysematous changes are present. There is new airspace consolidation with surrounding tree-in-bud opacities  in the left lower lobe. Secretions are seen in the left mainstem bronchus and occluding left lower lobe bronchi. There also secretions minimally in right lower lobe bronchi with scattered minimal tree-in-bud and ground-glass opacities in the right lower lobe. There is no pleural effusion or pneumothorax. Musculoskeletal: No chest wall mass or suspicious bone lesions identified. CT ABDOMEN PELVIS FINDINGS Hepatobiliary: There are proximally five new hypodense hepatic lesions measuring up to 2.3 cm in diameter. These are suspicious for metastatic disease. Gallstones are present. No biliary ductal dilatation. Pancreas: Unremarkable. No pancreatic ductal dilatation or surrounding inflammatory changes. Spleen: Normal in size without focal abnormality. Adrenals/Urinary Tract: There is a single punctate calculus in the lower pole the right kidney. There are additional calculi in the mid and lower pole of the left kidney measuring up to 15 mm. These are similar to prior. There is no hydronephrosis. Left renal cysts are present measuring up to 3.2 cm. The adrenal glands are within normal limits. There is trabeculated bladder wall. Stomach/Bowel: No evidence of bowel wall thickening, distention, or inflammatory changes. There is a  large amount of stool throughout the colon. The appendix is not visualized. There is a percutaneous gastrostomy tube in place. Vascular/Lymphatic: Aortic atherosclerosis. No enlarged abdominal or pelvic lymph nodes. Reproductive: Prostate gland is enlarged and heterogeneous. Other: No abdominal wall hernia or abnormality. No abdominopelvic ascites. Musculoskeletal: No acute or significant osseous findings. IMPRESSION: 1. New airspace consolidation with surrounding tree-in-bud opacities in the left lower lobe worrisome for pneumonia. Secretions are seen in the left mainstem bronchus and occluding left lower lobe bronchi can be seen in the setting of aspiration. 2. Minimal secretions in the right lower lobe bronchi with scattered minimal tree-in-bud and ground-glass opacities in the right lower lobe. 3. New hypodense hepatic lesions suspicious for metastatic disease. 4. Multiple peripherally enhancing low-attenuation areas in the submental region worrisome for disease recurrence or metastatic disease. Abscess not excluded. 5. New hypodense lesion in the low right jugular chain worrisome for metastatic disease. 6. Nonobstructing bilateral renal calculi. 7. Cholelithiasis. 8. Constipation. 9. Left Bosniak I benign renal cyst measuring 3.2 cm. No follow-up imaging is recommended. JACR 2018 Feb; 264-273, Management of the Incidental Renal Mass on CT, [employer] 2021; 814-848, Bosniak Classification of Cystic Renal Masses, Version 2019. Aortic Atherosclerosis (ICD10-I70.0) and Emphysema (ICD10-J43.9). Electronically Signed   By: Greig Pique M.D.   On: 07/22/2023 23:08   CT HEAD WO CONTRAST ( ) Result Date: 07/22/2023 CLINICAL DATA:  Stroke, hemorrhagic.  Seizure like activity. EXAM: CT HEAD WITHOUT CONTRAST TECHNIQUE: Contiguous axial images were obtained from the base of the skull through the vertex without intravenous contrast. RADIATION DOSE REDUCTION: This exam was performed according to the departmental  dose-optimization program which includes automated exposure control, adjustment of the mA and/or kV according to patient size and/or use of iterative reconstruction technique. COMPARISON:  CT head 07/21/2023. FINDINGS: Brain: No evidence of acute large vascular territory infarction, hemorrhage, hydrocephalus, extra-axial collection or mass lesion/mass effect. Vascular: No hyperdense vessel. Skull: No acute fracture. Sinuses/Orbits: Clear sinuses.  No acute orbital findings. Other: No mastoid effusions. IMPRESSION: No evidence of acute intracranial abnormality. Electronically Signed   By: Gilmore GORMAN Molt M.D.   On: 07/22/2023 23:02   DG Swallowing Func-Speech Pathology Result Date: 07/22/2023 Table formatting from the original result was not included. Modified Barium Swallow Study Patient Details Name: Shane Perry MRN: [id number] Date of Birth: 08-Mar-1956 Today's Date: 07/22/2023 HPI/PMH: HPI: Shane Perry is a 68 y.o. African-American male  with medical history significant for type diabetes mellitus, peripheral neuropathy, hypertension, dyslipidemia, urolithiasis and lung cancer status postchemotherapy and radiotherapy as well as glossectomy, and modified neck dissection on 03/14/2023 with tracheostomy and PEG tube placement who cleared show, who presented to the emergency room with acute onset of altered mental status with confusion and disorientation on 07/21/2023. Of note, pt was taken to ED at [facility] by [facility] on 07/19/2023  as pt was found driving on the wrong side [place] [address]. MRI revealed 07/19/2023 Mild generalized cerebral atrophy. Multifocal T2 FLAIR hyperintense signal abnormality within the cerebral white matter, nonspecific but compatible with mild chronic small vessel ischemic disease. Punctate chronic microhemorrhage within the inferior left parietal lobe. 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 Two small chronic infarcts within the left cerebellar hemisphere. There is no  acute infarct. No evidence of an intracranial mass. No extra-axial fluid collection. No midline shift. Clinical Impression: Clinical Impression: Pt presented to fluro room with incessant talking and continued decreased speech intelligibility (<25%). This clinical research associate made attempts to redirect pt to yes/no questions and to redirect pt to instructions regarding swallow study. Pt was agreeable to study and was transferred by staff into radiology chair. He continued talking and pointing to his knee and different fingers. This writer was not able to assist pt with communication.     Pt was agreeable to consuming nectar thick liquid via spoon. While holding this bolus in the anterior portion of his mouth, he continued talking for > 10 minutes with no production of swallow response. After extended period of time, pt was able to be redirected to consuming thin liquids via spoon. After 1 spoonful size bolus pt stated need another one. Second tsp size thin liquid bolus was administered with bolus falling over back of tongue and silent aspiration observed. Nectar thick liquid bolus had to be removed manually by this clinical research associate with washcloth. At this time, pt is at increased risk of aspiration and would benefit from NPO status with use of PEG for nutrition. Should pt wish to return to PO intake, would recommend follow up Outpatient Modified Barium Swallow Study. Factors that may increase risk of adverse event in presence of aspiration Noe & Lianne 2021): Factors that may increase risk of adverse event in presence of aspiration Noe & Lianne 2021): Poor general health and/or compromised immunity; Respiratory or GI disease; Reduced cognitive function; Inadequate oral hygiene; Reduced saliva; Aspiration of thick, dense, and/or acidic materials; Frequent aspiration of large volumes; Presence of tubes (ETT, trach, NG, etc.) Recommendations/Plan: Swallowing Evaluation Recommendations Swallowing Evaluation Recommendations  Recommendations: NPO; Alternative means of nutrition - G Tube Medication Administration: Via alternative means Oral care recommendations: Oral care QID (4x/day) Treatment Plan Treatment Plan Treatment recommendations: No treatment recommended at this time Follow-up recommendations: Follow physicians's recommendations for discharge plan and follow up therapies Functional status assessment: Patient has had a recent decline in their functional status and/or demonstrates limited ability to make significant improvements in function in a reasonable and predictable amount of time. Recommendations Recommendations for follow up therapy are one component of a multi-disciplinary discharge planning process, led by the attending physician.  Recommendations may be updated based on patient status, additional functional criteria and insurance authorization. Assessment: Orofacial Exam: Orofacial Exam Oral Cavity: Oral Hygiene: Dried secretions Oral Cavity - Dentition: Poor condition; Missing dentition Orofacial Anatomy: WFL Oral Motor/Sensory Function: -- (s/p glossectomy) Anatomy: Anatomy: WFL Boluses Administered: Boluses Administered Boluses Administered: Thin liquids (Level 0); Mildly thick liquids (Level 2,  nectar thick)  Oral Impairment Domain: Oral Impairment Domain Lip Closure: Escape beyond mid-chin; Escape progressing to mid-chin Tongue control during bolus hold: Posterior escape of greater than half of bolus Bolus transport/lingual motion: Minimal-no tongue motion Oral residue: Complete oral clearance Location of oral residue : Floor of mouth; Tongue; Lateral sulci Initiation of pharyngeal swallow : No visible initiation at any location; Pyriform sinuses  Pharyngeal Impairment Domain: Pharyngeal Impairment Domain Soft palate elevation: No bolus between soft palate (SP)/pharyngeal wall (PW) Laryngeal elevation: No superior movement of thyroid cartilage Anterior hyoid excursion: No anterior movement Epiglottic movement: No  inversion Laryngeal vestibule closure: None, wide column air/contrast in laryngeal vestibule Pharyngeal stripping wave : Present - diminished Pharyngoesophageal segment opening: Minimal distention/minimal duration, marked obstruction of flow Tongue base retraction: No visible posterior motion of tongue base Pharyngeal residue: Majority of contrast within or on pharyngeal structures; Minimal to no pharyngeal clearance Location of pharyngeal residue: Valleculae; Pharyngeal wall; Aryepiglottic folds; Pyriform sinuses  Esophageal Impairment Domain: Esophageal Impairment Domain Esophageal clearance upright position: Complete clearance, esophageal coating Pill: No data recorded Penetration/Aspiration Scale Score: Penetration/Aspiration Scale Score 8.  Material enters airway, passes BELOW cords without attempt by patient to eject out (silent aspiration) : Thin liquids (Level 0) Compensatory Strategies: No data recorded  General Information: Caregiver present: No  Diet Prior to this Study: NPO   Temperature : Normal   Respiratory Status: WFL   Supplemental O2: None (Room air) (pt with capped trach)   History of Recent Intubation: No  Behavior/Cognition: Alert; Cooperative; Pleasant mood; Distractible Self-Feeding Abilities: Able to self-feed Baseline vocal quality/speech: -- (hoarse) Volitional Cough: Unable to elicit Volitional Swallow: Unable to elicit Exam Limitations: -- (excessiver verbalization, difficult to redirect to task) Goal Planning: Prognosis for improved oropharyngeal function: Guarded Barriers to Reach Goals: Cognitive deficits; Time post onset; Severity of deficits; Behavior; Medication; Overall medical prognosis (radical neck dissection and glossectomy) No data recorded Patient/Family Stated Goal: none stated Consulted and agree with results and recommendations: Patient; Physician; Nurse (Josh Uriel Horkey, [facility]) Pain: Pain Assessment Pain Assessment: No/denies pain Breathing: 0 Negative  Vocalization: 0 Facial Expression: 0 Body Language: 0 Consolability: 0 PAINAD Score: 0 End of Session: Start Time:SLP Start Time (ACUTE ONLY): 1137 Stop Time: SLP Stop Time (ACUTE ONLY): 1158 Time Calculation:SLP Time Calculation (min) (ACUTE ONLY): 21 min Charges: SLP Evaluations $ SLP Speech Visit: 1 Visit SLP Evaluations $BSS Swallow: 1 Procedure $MBS Swallow: 1 Procedure SLP visit diagnosis: SLP Visit Diagnosis: Dysphagia, oropharyngeal phase (R13.12) Past Medical History: Past Medical History: Diagnosis Date  Cancer (HCC)   Diabetes mellitus without complication (HCC)   Diabetic polyneuropathy associated with type 2 diabetes mellitus (HCC)   History of kidney stones   Hyperlipidemia   Hypertension   Kidney stones   Onychomycosis   Panlobular emphysema (HCC)   Squamous cell carcinoma, tongue border (HCC)   Tongue cancer (HCC)   Weight loss  Past Surgical History: Past Surgical History: Procedure Laterality Date  COLONOSCOPY    COLONOSCOPY WITH PROPOFOL  N/A 04/26/2023  Procedure: COLONOSCOPY WITH PROPOFOL ;  Surgeon: Jinny Carmine, MD;  Location: [facility] ENDOSCOPY;  Service: Endoscopy;  Laterality: N/A;  CYST REMOVAL NECK    GASTROSTOMY N/A 05/10/2023  Procedure: INSERTION OF GASTROSTOMY TUBE, open;  Surgeon: Desiderio Schanz, MD;  Location: [facility] ORS;  Service: General;  Laterality: N/A;  IR IMAGING GUIDED PORT INSERTION  04/14/2023  left arm muscle removal for tongue    partial tongue removal  03/14/2023  PORTA CATH INSERTION Right  tongue cancer    TRACHEOSTOMY  03/14/2023 Happi Overton 07/22/2023, 3:00 PM  DG Chest Portable 1 View Result Date: 07/21/2023 CLINICAL DATA:  Recent fall EXAM: PORTABLE CHEST 1 VIEW COMPARISON:  None FINDINGS: Cardiac shadow is within normal limits. Right chest wall port and tracheostomy tube are noted in satisfactory position. Lungs are clear bilaterally. Multiple skin folds are noted. No bony abnormality is seen. IMPRESSION: No active disease. Electronically Signed   By: Oneil Devonshire M.D.    On: 07/21/2023 20:48   CT Head Wo Contrast Result Date: 07/21/2023 CLINICAL DATA:  Head trauma, minor (Age >= 65y); Neck trauma (Age >= 65y). Unwitnessed fall. Altered mental status. History of tongue cancer. EXAM: CT HEAD WITHOUT CONTRAST CT CERVICAL SPINE WITHOUT CONTRAST TECHNIQUE: Multidetector CT imaging of the head and cervical spine was performed following the standard protocol without intravenous contrast. Multiplanar CT image reconstructions of the cervical spine were also generated. RADIATION DOSE REDUCTION: This exam was performed according to the departmental dose-optimization program which includes automated exposure control, adjustment of the mA and/or kV according to patient size and/or use of iterative reconstruction technique. COMPARISON:  Head MRI 07/19/2023.  PET-04/06/2023. FINDINGS: CT HEAD FINDINGS Brain: There is no evidence of an acute infarct, intracranial hemorrhage, mass, midline shift, or extra-axial fluid collection. There is mild cerebral atrophy. 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 Vascular: Calcified atherosclerosis at the skull base. No hyperdense vessel. Skull: No acute fracture or suspicious osseous lesion. Sinuses/Orbits: Small mucous retention cyst in the left maxillary sinus. Clear mastoid air cells. Unremarkable orbits. Other: None. CT CERVICAL SPINE FINDINGS Alignment: Normal. Skull base and vertebrae: No acute fracture or suspicious osseous lesion. Soft tissues and spinal canal: No prevertebral fluid or swelling. No visible canal hematoma. Disc levels: Cervical spondylosis and facet arthrosis. Moderate spinal stenosis and severe left greater than right neural foraminal stenosis at C5-6. Moderate left neural foraminal stenosis at C3-4 and C4-5. Upper chest: Emphysema. Other: Partially visualized tracheostomy. Partially visualized postsurgical changes in the left neck. 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  IMPRESSION: 1. No evidence of acute intracranial abnormality or acute cervical spine fracture. 2. Known cervical nodal metastatic disease. Electronically Signed   By: Dasie Hamburg M.D.   On: 07/21/2023 20:25   CT Cervical Spine Wo Contrast Result Date: 07/21/2023 CLINICAL DATA:  Head trauma, minor (Age >= 65y); Neck trauma (Age >= 65y). Unwitnessed fall. Altered mental status. History of tongue cancer. EXAM: CT HEAD WITHOUT CONTRAST CT CERVICAL SPINE WITHOUT CONTRAST TECHNIQUE: Multidetector CT imaging of the head and cervical spine was performed following the standard protocol without intravenous contrast. Multiplanar CT image reconstructions of the cervical spine were also generated. RADIATION DOSE REDUCTION: This exam was performed according to the departmental dose-optimization program which includes automated exposure control, adjustment of the mA and/or kV according to patient size and/or use of iterative reconstruction technique. COMPARISON:  Head MRI 07/19/2023.  PET-04/06/2023. FINDINGS: CT HEAD FINDINGS Brain: There is no evidence of an acute infarct, intracranial hemorrhage, mass, midline shift, or extra-axial fluid collection. There is mild cerebral atrophy. 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 Vascular: Calcified atherosclerosis at the skull base. No hyperdense vessel. Skull: No acute fracture or suspicious osseous lesion. Sinuses/Orbits: Small mucous retention cyst in the left maxillary sinus. Clear mastoid air cells. Unremarkable orbits. Other: None. CT CERVICAL SPINE FINDINGS Alignment: Normal. Skull base and vertebrae: No acute fracture or suspicious osseous lesion. Soft tissues and spinal  canal: No prevertebral fluid or swelling. No visible canal hematoma. Disc levels: Cervical spondylosis and facet arthrosis. Moderate spinal stenosis and severe left greater than right neural foraminal stenosis at C5-6. Moderate left neural foraminal stenosis at C3-4 and C4-5. Upper chest:  Emphysema. Other: Partially visualized tracheostomy. Partially visualized postsurgical changes in the left neck. 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 IMPRESSION: 1. No evidence of acute intracranial abnormality or acute cervical spine fracture. 2. Known cervical nodal metastatic disease. Electronically Signed   By: Dasie Hamburg M.D.   On: 07/21/2023 20:25   MR BRAIN WO CONTRAST Result Date: 07/19/2023 CLINICAL DATA:  Provided history: Stroke, follow-up. EXAM: MRI HEAD WITHOUT CONTRAST TECHNIQUE: Multiplanar, multiecho pulse sequences of the brain and surrounding structures were obtained without intravenous contrast. COMPARISON:  Head CT 07/19/2023. FINDINGS: Brain: Mild generalized cerebral atrophy. Multifocal T2 FLAIR hyperintense signal abnormality within the cerebral white matter, nonspecific but compatible with mild chronic small vessel ischemic disease. Punctate chronic microhemorrhage within the inferior left parietal lobe. 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 Two small chronic infarcts within the left cerebellar hemisphere. There is no acute infarct. No evidence of an intracranial mass. No extra-axial fluid collection. No midline shift. Vascular: Maintained flow voids within the proximal large arterial vessels. Skull and upper cervical spine: No focal worrisome marrow lesion. Sinuses/Orbits: No mass or acute finding within the imaged orbits. Small mucous retention cyst within the left maxillary sinus. IMPRESSION: 1.  No evidence of an acute intracranial abnormality. 2. 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 3. Chronic microhemorrhage within the left parietal lobe. 4. Small left maxillary sinus mucous retention cyst.  Yes Electronically Signed   By: Rockey Childs D.O.   On: 07/19/2023 11:26   CT Head Wo Contrast Result Date: 07/19/2023 CLINICAL DATA:  Altered mental status with unknown  cause. EXAM: CT HEAD WITHOUT CONTRAST TECHNIQUE: Contiguous axial images were obtained from the base of the skull through the vertex without intravenous contrast. RADIATION DOSE REDUCTION: This exam was performed according to the departmental dose-optimization program which includes automated exposure control, adjustment of the mA and/or kV according to patient size and/or use of iterative reconstruction technique. COMPARISON:  None Available. FINDINGS: Brain: No evidence of acute infarction, hemorrhage, hydrocephalus, extra-axial collection or mass lesion/mass effect. Small, discrete and chronic appearing lacune is in the left cerebellum. Mild generalized cerebral volume loss. Vascular: No hyperdense vessel or unexpected calcification. Skull: Normal. Negative for fracture or focal lesion. Sinuses/Orbits: No acute finding IMPRESSION: No acute or reversible finding. Electronically Signed   By: Dorn Roulette M.D.   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  Patient was placed on ventilator before being transitioned to comfort care.  Now on fentanyl  drip.  Patient seen.  No family at bedside.  He remains on fentanyl  drip and comfort care.  Currently comfortable appearing.  Anticipate in-hospital death  PLAN: -Comfort care   Time Total: 15 minutes  Visit consisted of counseling and education dealing with the complex and emotionally intense issues of symptom management and palliative care in the setting of serious and potentially life-threatening illness.Greater than 50%  of this time was spent counseling and coordinating care related  to the above assessment and plan.  Signed by: Fonda Mower, PhD,  NP-C

## 2023-08-20 NOTE — Plan of Care (Signed)
 Comfort Care pt passed - at 20:49. Pronounced by Domenica Santiago, LPN and this RN.  Called to inform Herbert Keatley, brother.  No family will be attending.  He said he would call pt's son Georgette.  Arrangements have been made with [facility] in [place].

## 2023-08-20 NOTE — Progress Notes (Signed)
 [facility] Liaison Note  Patient's condition remains unchanged from yesterday.  RN reported to Hospital Liaison that an inpatient hospital death is anticipated.     Please don't hesitate to call with any Hospice related questions or concerns.    Thank you for the opportunity to participate in this patient's care.  [facility] Liaison [phone]

## 2023-08-20 NOTE — Progress Notes (Signed)
 Pt son at bedside stated that he had made funeral arrangements at [facility]. Doug [place] [phone].

## 2023-08-20 DEATH — deceased

## 2023-09-19 ENCOUNTER — Other Ambulatory Visit: Payer: 59
